# Patient Record
Sex: Male | Born: 1975 | Race: White | Hispanic: No | Marital: Married | Smoking: Smoker, current status unknown
Health system: Southern US, Community
[De-identification: ages and names within clinical notes are randomized; demographics above are authoritative.]

## PROBLEM LIST (undated history)

## (undated) DIAGNOSIS — F329 Major depressive disorder, single episode, unspecified: Secondary | ICD-10-CM

## (undated) DIAGNOSIS — I10 Essential (primary) hypertension: Secondary | ICD-10-CM

## (undated) DIAGNOSIS — G5600 Carpal tunnel syndrome, unspecified upper limb: Secondary | ICD-10-CM

## (undated) DIAGNOSIS — D696 Thrombocytopenia, unspecified: Secondary | ICD-10-CM

## (undated) DIAGNOSIS — E785 Hyperlipidemia, unspecified: Secondary | ICD-10-CM

## (undated) DIAGNOSIS — E039 Hypothyroidism, unspecified: Secondary | ICD-10-CM

## (undated) HISTORY — DX: Major depressive disorder, single episode, unspecified: F32.9

## (undated) HISTORY — DX: Hypothyroidism, unspecified: E03.9

## (undated) HISTORY — DX: Essential (primary) hypertension: I10

## (undated) HISTORY — PX: BONE MARROW BIOPSY: SHX1253

## (undated) HISTORY — DX: Carpal tunnel syndrome, unspecified upper limb: G56.00

## (undated) HISTORY — DX: Hyperlipidemia, unspecified: E78.5

## (undated) HISTORY — DX: Thrombocytopenia, unspecified: D69.6

---

## 2010-01-04 ENCOUNTER — Ambulatory Visit: Payer: Self-pay | Admitting: Family Medicine

## 2010-01-04 ENCOUNTER — Telehealth: Payer: Self-pay | Admitting: Pulmonary Disease

## 2010-01-04 DIAGNOSIS — R0989 Other specified symptoms and signs involving the circulatory and respiratory systems: Secondary | ICD-10-CM

## 2010-01-04 DIAGNOSIS — G5600 Carpal tunnel syndrome, unspecified upper limb: Secondary | ICD-10-CM

## 2010-01-04 DIAGNOSIS — R0609 Other forms of dyspnea: Secondary | ICD-10-CM

## 2010-01-04 DIAGNOSIS — R05 Cough: Secondary | ICD-10-CM

## 2010-01-04 DIAGNOSIS — F17200 Nicotine dependence, unspecified, uncomplicated: Secondary | ICD-10-CM

## 2010-01-04 HISTORY — DX: Carpal tunnel syndrome, unspecified upper limb: G56.00

## 2010-01-11 ENCOUNTER — Telehealth (INDEPENDENT_AMBULATORY_CARE_PROVIDER_SITE_OTHER): Payer: Self-pay | Admitting: *Deleted

## 2010-01-11 ENCOUNTER — Ambulatory Visit: Payer: Self-pay | Admitting: Family Medicine

## 2010-01-11 LAB — CONVERTED CEMR LAB
ALT: 29 units/L (ref 0–53)
Albumin: 4.5 g/dL (ref 3.5–5.2)
Basophils Relative: 1.2 % (ref 0.0–3.0)
CO2: 31 meq/L (ref 19–32)
Chloride: 106 meq/L (ref 96–112)
Eosinophils Absolute: 1 10*3/uL — ABNORMAL HIGH (ref 0.0–0.7)
HCT: 40.5 % (ref 39.0–52.0)
Hemoglobin: 14.4 g/dL (ref 13.0–17.0)
MCHC: 35.6 g/dL (ref 30.0–36.0)
MCV: 89 fL (ref 78.0–100.0)
Monocytes Absolute: 0.5 10*3/uL (ref 0.1–1.0)
Neutro Abs: 5.2 10*3/uL (ref 1.4–7.7)
Potassium: 4.5 meq/L (ref 3.5–5.1)
RBC: 4.55 M/uL (ref 4.22–5.81)
Sodium: 143 meq/L (ref 135–145)
Total Protein: 8.1 g/dL (ref 6.0–8.3)

## 2010-01-12 ENCOUNTER — Ambulatory Visit: Payer: Self-pay | Admitting: Family Medicine

## 2010-01-12 DIAGNOSIS — D696 Thrombocytopenia, unspecified: Secondary | ICD-10-CM

## 2010-01-12 HISTORY — DX: Thrombocytopenia, unspecified: D69.6

## 2010-01-12 LAB — CONVERTED CEMR LAB
Basophils Relative: 0.7 % (ref 0.0–3.0)
Eosinophils Absolute: 1.2 10*3/uL — ABNORMAL HIGH (ref 0.0–0.7)
Eosinophils Relative: 13.5 % — ABNORMAL HIGH (ref 0.0–5.0)
Lymphocytes Relative: 23.8 % (ref 12.0–46.0)
MCHC: 34.9 g/dL (ref 30.0–36.0)
Neutrophils Relative %: 57.6 % (ref 43.0–77.0)
Platelets: 62 10*3/uL — ABNORMAL LOW (ref 150.0–400.0)
RBC: 4.23 M/uL (ref 4.22–5.81)
WBC: 8.7 10*3/uL (ref 4.5–10.5)

## 2010-01-13 ENCOUNTER — Ambulatory Visit: Payer: Self-pay | Admitting: Hematology & Oncology

## 2010-01-19 ENCOUNTER — Ambulatory Visit: Payer: Self-pay | Admitting: Family Medicine

## 2010-01-19 DIAGNOSIS — J019 Acute sinusitis, unspecified: Secondary | ICD-10-CM

## 2010-01-19 DIAGNOSIS — H6690 Otitis media, unspecified, unspecified ear: Secondary | ICD-10-CM | POA: Insufficient documentation

## 2010-01-21 ENCOUNTER — Telehealth (INDEPENDENT_AMBULATORY_CARE_PROVIDER_SITE_OTHER): Payer: Self-pay | Admitting: *Deleted

## 2010-01-25 ENCOUNTER — Encounter: Payer: Self-pay | Admitting: Family Medicine

## 2010-01-25 LAB — CBC WITH DIFFERENTIAL (CANCER CENTER ONLY)
Eosinophils Absolute: 0.5 10*3/uL (ref 0.0–0.5)
LYMPH%: 21.9 % (ref 14.0–48.0)
MCV: 90 fL (ref 82–98)
MONO#: 0.4 10*3/uL (ref 0.1–0.9)
NEUT#: 5.5 10*3/uL (ref 1.5–6.5)
Platelets: 66 10*3/uL — ABNORMAL LOW (ref 145–400)
RBC: 4.55 10*6/uL (ref 4.20–5.70)
WBC: 8.2 10*3/uL (ref 4.0–10.0)

## 2010-01-25 LAB — CHCC SATELLITE - SMEAR

## 2010-01-26 ENCOUNTER — Ambulatory Visit: Payer: Self-pay | Admitting: Family Medicine

## 2010-01-26 DIAGNOSIS — E785 Hyperlipidemia, unspecified: Secondary | ICD-10-CM | POA: Insufficient documentation

## 2010-01-26 HISTORY — DX: Hyperlipidemia, unspecified: E78.5

## 2010-01-26 LAB — FERRITIN: Ferritin: 135 ng/mL (ref 22–322)

## 2010-01-26 LAB — COMPREHENSIVE METABOLIC PANEL
ALT: 22 U/L (ref 0–53)
CO2: 26 mEq/L (ref 19–32)
Calcium: 9.6 mg/dL (ref 8.4–10.5)
Chloride: 101 mEq/L (ref 96–112)
Sodium: 138 mEq/L (ref 135–145)
Total Protein: 7.8 g/dL (ref 6.0–8.3)

## 2010-03-15 ENCOUNTER — Ambulatory Visit: Payer: Self-pay | Admitting: Family Medicine

## 2010-03-15 DIAGNOSIS — E039 Hypothyroidism, unspecified: Secondary | ICD-10-CM | POA: Insufficient documentation

## 2010-03-15 HISTORY — DX: Hypothyroidism, unspecified: E03.9

## 2010-03-15 LAB — CONVERTED CEMR LAB
Free T4: 0.46 ng/dL — ABNORMAL LOW (ref 0.60–1.60)
T3, Free: 2.1 pg/mL — ABNORMAL LOW (ref 2.3–4.2)
TSH: 108.94 microintl units/mL — ABNORMAL HIGH (ref 0.35–5.50)

## 2010-04-02 ENCOUNTER — Ambulatory Visit: Payer: Self-pay | Admitting: Hematology & Oncology

## 2010-04-05 ENCOUNTER — Encounter: Payer: Self-pay | Admitting: Family Medicine

## 2010-04-05 LAB — CBC WITH DIFFERENTIAL (CANCER CENTER ONLY)
BASO#: 0 10*3/uL (ref 0.0–0.2)
EOS%: 4.9 % (ref 0.0–7.0)
HGB: 14.1 g/dL (ref 13.0–17.1)
LYMPH#: 2.1 10*3/uL (ref 0.9–3.3)
MCHC: 34.1 g/dL (ref 32.0–35.9)
MONO#: 0.3 10*3/uL (ref 0.1–0.9)
NEUT#: 5.2 10*3/uL (ref 1.5–6.5)
RBC: 4.68 10*6/uL (ref 4.20–5.70)
WBC: 8.1 10*3/uL (ref 4.0–10.0)

## 2010-04-12 ENCOUNTER — Ambulatory Visit: Payer: Self-pay | Admitting: Endocrinology

## 2010-04-20 ENCOUNTER — Ambulatory Visit: Payer: Self-pay | Admitting: Family Medicine

## 2010-04-21 LAB — CONVERTED CEMR LAB
ALT: 18 units/L (ref 0–53)
Albumin: 4.2 g/dL (ref 3.5–5.2)
Bilirubin, Direct: 0.1 mg/dL (ref 0.0–0.3)
Total Protein: 6.9 g/dL (ref 6.0–8.3)

## 2010-04-27 ENCOUNTER — Ambulatory Visit: Payer: Self-pay | Admitting: Family Medicine

## 2010-04-27 DIAGNOSIS — F329 Major depressive disorder, single episode, unspecified: Secondary | ICD-10-CM

## 2010-04-27 DIAGNOSIS — F3289 Other specified depressive episodes: Secondary | ICD-10-CM

## 2010-04-27 HISTORY — DX: Other specified depressive episodes: F32.89

## 2010-04-27 HISTORY — DX: Major depressive disorder, single episode, unspecified: F32.9

## 2010-05-07 ENCOUNTER — Ambulatory Visit: Payer: Self-pay | Admitting: Hematology & Oncology

## 2010-05-07 ENCOUNTER — Telehealth (INDEPENDENT_AMBULATORY_CARE_PROVIDER_SITE_OTHER): Payer: Self-pay | Admitting: *Deleted

## 2010-05-10 ENCOUNTER — Encounter: Payer: Self-pay | Admitting: Family Medicine

## 2010-05-10 ENCOUNTER — Encounter: Payer: Self-pay | Admitting: Endocrinology

## 2010-05-10 LAB — CBC WITH DIFFERENTIAL (CANCER CENTER ONLY)
BASO%: 0.6 % (ref 0.0–2.0)
LYMPH#: 1.6 10*3/uL (ref 0.9–3.3)
LYMPH%: 23.8 % (ref 14.0–48.0)
MONO#: 0.4 10*3/uL (ref 0.1–0.9)
NEUT#: 4.5 10*3/uL (ref 1.5–6.5)
Platelets: 47 10*3/uL — ABNORMAL LOW (ref 145–400)
RBC: 5.14 10*6/uL (ref 4.20–5.70)
RDW: 11.1 % (ref 10.5–14.6)
WBC: 6.9 10*3/uL (ref 4.0–10.0)

## 2010-05-10 LAB — TSH: TSH: 8.846 u[IU]/mL — ABNORMAL HIGH (ref 0.350–4.500)

## 2010-05-16 ENCOUNTER — Encounter: Payer: Self-pay | Admitting: Endocrinology

## 2010-06-25 ENCOUNTER — Ambulatory Visit: Payer: Self-pay | Admitting: Hematology & Oncology

## 2010-06-28 ENCOUNTER — Encounter: Payer: Self-pay | Admitting: Family Medicine

## 2010-06-28 LAB — CHCC SATELLITE - SMEAR

## 2010-06-28 LAB — CBC WITH DIFFERENTIAL (CANCER CENTER ONLY)
BASO#: 0.1 10*3/uL (ref 0.0–0.2)
EOS%: 3 % (ref 0.0–7.0)
MCH: 28.1 pg (ref 28.0–33.4)
MCHC: 33.6 g/dL (ref 32.0–35.9)
MONO%: 6.4 % (ref 0.0–13.0)
NEUT#: 4.7 10*3/uL (ref 1.5–6.5)
Platelets: 40 10*3/uL — ABNORMAL LOW (ref 145–400)
RDW: 12.5 % (ref 10.5–14.6)

## 2010-07-27 ENCOUNTER — Ambulatory Visit (HOSPITAL_COMMUNITY): Admission: RE | Admit: 2010-07-27 | Discharge: 2010-07-27 | Payer: Self-pay | Admitting: Hematology & Oncology

## 2010-07-27 ENCOUNTER — Ambulatory Visit: Payer: Self-pay | Admitting: Hematology & Oncology

## 2010-08-06 ENCOUNTER — Ambulatory Visit: Payer: Self-pay | Admitting: Hematology & Oncology

## 2010-08-09 ENCOUNTER — Encounter: Payer: Self-pay | Admitting: Family Medicine

## 2010-08-09 LAB — CBC WITH DIFFERENTIAL (CANCER CENTER ONLY)
BASO%: 0.4 % (ref 0.0–2.0)
EOS%: 3.8 % (ref 0.0–7.0)
HCT: 44.7 % (ref 38.7–49.9)
LYMPH%: 19.2 % (ref 14.0–48.0)
MCHC: 34 g/dL (ref 32.0–35.9)
MCV: 84 fL (ref 82–98)
MONO#: 0.3 10*3/uL (ref 0.1–0.9)
NEUT%: 72.1 % (ref 40.0–80.0)
RDW: 13.2 % (ref 10.5–14.6)

## 2010-10-18 ENCOUNTER — Encounter: Payer: Self-pay | Admitting: Family Medicine

## 2010-10-18 ENCOUNTER — Ambulatory Visit
Admission: RE | Admit: 2010-10-18 | Discharge: 2010-10-18 | Payer: Self-pay | Source: Home / Self Care | Attending: Family Medicine | Admitting: Family Medicine

## 2010-10-21 ENCOUNTER — Ambulatory Visit
Admission: RE | Admit: 2010-10-21 | Discharge: 2010-10-21 | Payer: Self-pay | Source: Home / Self Care | Attending: Family Medicine | Admitting: Family Medicine

## 2010-10-21 ENCOUNTER — Other Ambulatory Visit: Payer: Self-pay | Admitting: Family Medicine

## 2010-10-21 LAB — CONVERTED CEMR LAB
Bilirubin Urine: NEGATIVE
Glucose, Urine, Semiquant: NEGATIVE
Ketones, urine, test strip: NEGATIVE
Specific Gravity, Urine: 1.02
pH: 5

## 2010-10-21 LAB — CBC WITH DIFFERENTIAL/PLATELET
Eosinophils Relative: 2.8 % (ref 0.0–5.0)
HCT: 44.9 % (ref 39.0–52.0)
Hemoglobin: 15.4 g/dL (ref 13.0–17.0)
Lymphs Abs: 1.6 10*3/uL (ref 0.7–4.0)
Monocytes Relative: 6.8 % (ref 3.0–12.0)
Platelets: 45 10*3/uL — CL (ref 150.0–400.0)
RBC: 5.3 Mil/uL (ref 4.22–5.81)
WBC: 7.5 10*3/uL (ref 4.5–10.5)

## 2010-10-21 LAB — LIPID PANEL
Cholesterol: 178 mg/dL (ref 0–200)
LDL Cholesterol: 107 mg/dL — ABNORMAL HIGH (ref 0–99)
Total CHOL/HDL Ratio: 6

## 2010-10-21 LAB — BASIC METABOLIC PANEL
GFR: 96.34 mL/min (ref >60.00)
Glucose, Bld: 92 mg/dL (ref 70–99)
Potassium: 4.3 mEq/L (ref 3.5–5.1)
Sodium: 138 mEq/L (ref 135–145)

## 2010-10-21 LAB — HEPATIC FUNCTION PANEL
ALT: 24 U/L (ref 0–53)
AST: 23 U/L (ref 0–37)
Total Bilirubin: 0.6 mg/dL (ref 0.3–1.2)

## 2010-10-26 NOTE — Letter (Signed)
[facility]   Imported By: Lanelle Bal 05/25/2010 11:04:18  _____________________________________________________________________  External Attachment:    Type:   Image     Comment:   External Document

## 2010-10-26 NOTE — Assessment & Plan Note (Signed)
Summary: discuss labs/cholestorol med refill/kn   Vital Signs:  Patient profile:   35 year old male Height:      71 inches (180.34 cm) Weight:      269.25 pounds (122.39 kg) BMI:     37.69 Temp:     98.0 degrees F (36.67 degrees C) oral BP sitting:   100 / 64  (right arm) Cuff size:   large  Vitals Entered By: Lucious Groves CMA (April 27, 2010 11:04 AM) CC: Discuss labs/,ed refill./kb Is Patient Diabetic? No Pain Assessment Patient in pain? no        History of Present Illness:  Hyperlipidemia follow-up      This is a 35 year old man who presents for Hyperlipidemia follow-up.  The patient denies muscle aches, GI upset, abdominal pain, flushing, itching, constipation, diarrhea, and fatigue.  The patient denies the following symptoms: chest pain/pressure, exercise intolerance, dypsnea, palpitations, syncope, and pedal edema.  Compliance with medications (by patient report) has been near 100%.  Dietary compliance has been good.  The patient reports exercising daily.  Adjunctive measures currently used by the patient include weight reduction and Co-QA.    pt is also interested in quitting smoking-- chantix was too expensive.  He has not tried anything else.  Pt is depressed over the fact that he can not quit on his own.  His wife smokes as well.     Preventive Screening-Counseling & Management  Alcohol-Tobacco     Alcohol drinks/day: 0     Smoking Status: current     Smoking Cessation Counseling: yes     Smoke Cessation Stage: ready     Packs/Day: 2.0     Year Started: 1995  Caffeine-Diet-Exercise     Caffeine use/day: 2     Does Patient Exercise: no     Exercise Counseling: to improve exercise regimen  Current Medications (verified): 1)  Simcor 1000-20 Mg Xr24h-Tab (Niacin-Simvastatin) .Marland Kitchen.. 1 By Mouth Q Hs 2)  Levothyroxine Sodium 125 Mcg Tabs (Levothyroxine Sodium) .Marland Kitchen.. 1 Once Daily  Allergies (verified): No Known Drug Allergies  Physical Exam  General:   Well-developed,well-nourished,in no acute distress; alert,appropriate and cooperative throughout examination Lungs:  Normal respiratory effort, chest expands symmetrically. Lungs are clear to auscultation, no crackles or wheezes. Heart:  normal rate and no murmur.   Extremities:  No clubbing, cyanosis, edema, or deformity noted with normal full range of motion of all joints.   Psych:  Cognition and judgment appear intact. Alert and cooperative with normal attention span and concentration. No apparent delusions, illusions, hallucinations   Impression & Recommendations:  Problem # 1:  HYPERLIPIDEMIA (ICD-272.4)  pt is taking 500/20 samples-- for last 3-4 days --Dr Everardo All gave him samples NMR still pending  His updated medication list for this problem includes:    Simcor 1000-20 Mg Xr24h-tab (Niacin-simvastatin) .Marland Kitchen... 1 by mouth q hs  Labs Reviewed: SGOT: 22 (04/20/2010)   SGPT: 18 (04/20/2010)  Problem # 2:  TOBACCO USE (ICD-305.1)  Encouraged smoking cessation and discussed different methods for smoking cessation.   Orders: Tobacco use cessation intermediate 3-10 minutes (16109)  Problem # 3:  DEPRESSIVE DISORDER (ICD-311)  His updated medication list for this problem includes:    Wellbutrin Xl 150 Mg Xr24h-tab (Bupropion hcl) .Marland Kitchen... 1 by mouth once daily for 1 week then 2 by mouth once daily for 3 weeks    Wellbutrin Xl 150 Mg Xr24h-tab (Bupropion hcl) .Marland KitchenMarland KitchenMarland KitchenMarland Kitchen 3 by mouth once daily  Discussed treatment options, including trial  of antidpressant medication. Will refer to behavioral health. Follow-up call in in 24-48 hours and recheck in 2 weeks, sooner as needed. Patient agrees to call if any worsening of symptoms or thoughts of doing harm arise. Verified that the patient has no suicidal ideation at this time.   Complete Medication List: 1)  Simcor 1000-20 Mg Xr24h-tab (Niacin-simvastatin) .Marland Kitchen.. 1 by mouth q hs 2)  Levothyroxine Sodium 125 Mcg Tabs (Levothyroxine sodium) .Marland Kitchen.. 1 once  daily 3)  Wellbutrin Xl 150 Mg Xr24h-tab (Bupropion hcl) .Marland Kitchen.. 1 by mouth once daily for 1 week then 2 by mouth once daily for 3 weeks 4)  Wellbutrin Xl 150 Mg Xr24h-tab (Bupropion hcl) .... 3 by mouth once daily Prescriptions: WELLBUTRIN XL 150 MG XR24H-TAB (BUPROPION HCL) 3 by mouth once daily  #90 x 5   Entered and Authorized by:   Loreen Freud DO   Signed by:   Loreen Freud DO on 04/27/2010   Method used:   Print then Give to Patient   RxID:   [id number] WELLBUTRIN XL 150 MG XR24H-TAB (BUPROPION HCL) 3 by mouth once daily  #90 x 5   Entered and Authorized by:   Loreen Freud DO   Signed by:   Loreen Freud DO on 04/27/2010   Method used:   Print then Give to Patient   RxID:   [id number] WELLBUTRIN XL 150 MG XR24H-TAB (BUPROPION HCL) 1 by mouth once daily for 1 week then 2 by mouth once daily for 3 weeks  #60 x 0   Entered and Authorized by:   Loreen Freud DO   Signed by:   Loreen Freud DO on 04/27/2010   Method used:   [facility]        [facility] [phone][facility] (retail)       [address]       [place], [place]  [address]       Ph: [phone]       Fax: [phone]   RxID:   [id number]

## 2010-10-26 NOTE — Letter (Signed)
Summary: [facility]  [facility]   Imported By: Lanelle Bal 08/24/2010 12:34:14  _____________________________________________________________________  External Attachment:    Type:   Image     Comment:   External Document

## 2010-10-26 NOTE — Progress Notes (Signed)
Summary: -lab results  Phone Note Outgoing Call   Call placed by: [facility] CMA,  May 07, 2010 12:05 PM Details for Reason: con't simcor20/500----HDL is low--- more niaspan could help--- but you can try fish oil or flaxseed oil---recheck 3 months------NMR, hep 272.4 Summary of Call: left message to call office................Marland KitchenFelecia Deloach CMA  May 07, 2010 12:05 PM   Follow-up for Phone Call        pt aware. pt states that he has sample of med does not need refill at this time.............Marland KitchenFelecia Deloach CMA  May 10, 2010 11:05 AM   letter mailed .Marland KitchenMarland KitchenMarland KitchenFelecia Deloach CMA  May 10, 2010 11:08 AM     New/Updated Medications: SIMCOR 500-20 MG XR24H-TAB (NIACIN-SIMVASTATIN) take 1 tab at bedtime

## 2010-10-26 NOTE — Consult Note (Signed)
[facility]   Imported By: Lanelle Bal 02/02/2010 09:23:39  _____________________________________________________________________  External Attachment:    Type:   Image     Comment:   External Document

## 2010-10-26 NOTE — Assessment & Plan Note (Signed)
Summary: NEW PT/BCBS/NS/KDC   Vital Signs:  Patient profile:   35 year old male Height:      71 inches Weight:      304 pounds BMI:     42.55 Pulse rate:   84 / minute Pulse rhythm:   regular BP sitting:   130 / 84  (left arm) Cuff size:   large  Vitals Entered By: Army Fossa CMA (January 04, 2010 10:14 AM) CC: Pt here to establish, c/o being tired all the time.   History of Present Illness: Pt here to establish.  Pt c/o " circulation " in hands ---  Pt wakes up with numbness in hands.  He sleeps on stomach.  Pt works at [employer] and has a problem when [employer].  Pt has also been very tired lately.  No other complaints.     Preventive Screening-Counseling & Management  Alcohol-Tobacco     Alcohol drinks/day: 0     Smoking Status: current     Smoking Cessation Counseling: yes     Smoke Cessation Stage: ready     Packs/Day: 2.0     Year Started: 1995  Caffeine-Diet-Exercise     Caffeine use/day: 2     Does Patient Exercise: no     Exercise Counseling: to improve exercise regimen  Hep-HIV-STD-Contraception     Dental Visit-last 6 months no      Sexual History:  currently monogamous and married.        Drug Use:  no.    Current Medications (verified): 1)  Chantix Starting Month Pak 0.5 Mg X 11 & 1 Mg X 42 Tabs (Varenicline Tartrate) .... 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 Heart Disease Family History Diabetes 1st degree relative Family History Hypertension Family History of CAD Male 1st degree relative @ 33yo Family History Thyroid disease  Social History: Reviewed history and no changes required. 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 Eyes:  Denies blurring, discharge, double vision, eye irritation, eye pain, halos, itching, light sensitivity, red eye, vision loss-1 eye, and vision loss-both eyes; glasses. ENT:  Denies decreased hearing, difficulty swallowing, ear discharge, earache, hoarseness, nasal congestion, nosebleeds, postnasal drainage, ringing in ears, sinus pressure, and sore throat. CV:  Denies bluish discoloration of lips or nails, chest pain or discomfort, difficulty breathing at night, difficulty breathing while lying down, fainting, fatigue, leg cramps with exertion, lightheadness, near fainting, palpitations, shortness of breath with exertion, swelling of feet, swelling of hands, and weight gain. Resp:  Complains of wheezing; denies  chest discomfort, chest pain with inspiration, cough, coughing up blood, excessive snoring, hypersomnolence, morning headaches, pleuritic, shortness of breath, and sputum productive. GI:  Denies abdominal pain, bloody stools, change in bowel habits, constipation, dark tarry stools, diarrhea, excessive appetite, gas, hemorrhoids, indigestion, loss of appetite, nausea, vomiting, vomiting blood, and  yellowish skin color. GU:  Denies decreased libido, discharge, dysuria, erectile dysfunction, genital sores, hematuria, incontinence, nocturia, urinary frequency, and urinary hesitancy. MS:  Denies joint pain, joint redness, joint swelling, loss of strength, low back pain, mid back pain, muscle aches, muscle , cramps, muscle weakness, stiffness, and thoracic pain. Derm:  Denies changes in color of skin, changes in nail beds, dryness, excessive perspiration, flushing, hair loss, insect bite(s), itching, lesion(s), poor wound healing, and rash. Neuro:  Complains of numbness; denies brief paralysis, difficulty with concentration, disturbances in coordination, falling down, headaches, inability to speak, memory loss, poor balance, seizures, sensation of room spinning, tingling, tremors, visual disturbances, and weakness; numbness in hands. Psych:  Denies alternate hallucination ( auditory/visual), anxiety, depression, easily angered, easily tearful, irritability, mental problems, panic attacks, sense of great danger, suicidal thoughts/plans, thoughts of violence, unusual visions or sounds, and thoughts /plans of harming others. Endo:  Denies cold intolerance, excessive hunger, excessive thirst, excessive urination, heat intolerance, polyuria, and weight change. Heme:  Denies abnormal bruising, bleeding, enlarge lymph nodes, fevers, pallor, and skin discoloration. Allergy:  Denies hives or rash, itching eyes, persistent infections, seasonal allergies, and sneezing.  Physical Exam  General:  Well-developed,well-nourished,in no acute distress; alert,appropriate and cooperative throughout examination Head:  Normocephalic and atraumatic without obvious abnormalities. No apparent alopecia or balding. Eyes:  pupils equal, pupils round, pupils reactive to light, and no injection.   Ears:  External ear exam shows no significant lesions or deformities.  Otoscopic examination reveals clear canals, tympanic membranes  are intact bilaterally without bulging, retraction, inflammation or discharge. Hearing is grossly normal bilaterally. Nose:  External nasal examination shows no deformity or inflammation. Nasal mucosa are pink and moist without lesions or exudates. Mouth:  Oral mucosa and oropharynx without lesions or exudates.  Teeth in good repair. Neck:  No deformities, masses, or tenderness noted. Lungs:  Normal respiratory effort, chest expands symmetrically. Lungs are clear to auscultation, no crackles or wheezes. Heart:  normal rate and no murmur.   Abdomen:  Bowel sounds positive,abdomen soft and non-tender without masses, organomegaly or hernias noted. Genitalia:  Testes bilaterally descended without nodularity, tenderness or masses. No scrotal masses or lesions. No penis lesions or urethral discharge. Prostate:  Prostate gland firm and smooth, no enlargement, nodularity, tenderness, mass, asymmetry or induration. Msk:  normal ROM, no joint tenderness, no joint swelling, no joint warmth, no redness over joints, no joint deformities, no joint instability, and no crepitation.   Pulses:  R and L carotid,radial,femoral,dorsalis pedis and posterior tibial pulses are full and equal bilaterally Extremities:  No clubbing, cyanosis, edema, or deformity noted with normal full range of motion of all joints.   Neurologic:  No cranial nerve deficits noted. Station and gait are normal. Plantar reflexes are down-going bilaterally. DTRs are symmetrical throughout. Sensory, motor and coordinative functions appear intact. Skin:  Intact without suspicious lesions or rashes Cervical Nodes:  No lymphadenopathy noted Axillary Nodes:  No palpable lymphadenopathy Inguinal Nodes:  No significant adenopathy Psych:  Cognition and judgment appear intact. Alert and cooperative with normal attention span and concentration. No apparent delusions, illusions, hallucinations   Impression & Recommendations:  Problem # 1:  PREVENTIVE  HEALTH CARE (ICD-V70.0)  check fastng labs Reviewed preventive care protocols, scheduled due services, and updated immunizations.  Problem # 2:  TOBACCO USE (ICD-305.1)  His updated medication list for this problem includes:    Chantix Starting Month Pak 0.5 Mg X 11 & 1 Mg X 42 Tabs (Varenicline tartrate) .Marland Kitchen... As directed  Orders: Pulmonary Referral (Pulmonary) Tobacco use cessation intermediate 3-10 minutes (16109)  Encouraged smoking cessation and discussed different methods for smoking cessation.   Problem # 3:  CARPAL TUNNEL SYNDROME, BILATERAL (ICD-354.0) wrist splints hand surgeon if no better  Problem # 4:  SNORING (ICD-786.09)  Orders: Sleep Disorder Referral (Sleep Disorder)  Recommended fluid and salt restriction.   Problem # 5:  COUGH (ICD-786.2)  Orders: T-2 View CXR (71020TC) Pulmonary Referral (Pulmonary)  Complete Medication List: 1)  Chantix Starting Month Pak 0.5 Mg X 11 & 1 Mg X 42 Tabs (Varenicline tartrate) .... As directed  Patient Instructions: 1)  V70.00  cbcd, bmp, hep, NMR, TSH,  UA Prescriptions: CHANTIX STARTING MONTH PAK 0.5 MG X 11 & 1 MG X 42 TABS (VARENICLINE TARTRATE) as directed  #1 x 0   Entered and Authorized by:   Loreen Freud DO   Signed by:   Loreen Freud DO on 01/04/2010   Method used:   Print then Give to Patient   RxID:   [id number]

## 2010-10-26 NOTE — Consult Note (Signed)
[facility]   Imported By: Lanelle Bal 02/15/2010 10:36:58  _____________________________________________________________________  External Attachment:    Type:   Image     Comment:   External Document

## 2010-10-26 NOTE — Progress Notes (Signed)
Summary: Lab Results  Phone Note Outgoing Call   Call placed by: Army Fossa CMA,  January 21, 2010 1:31 PM Reason for Call: Discuss lab or test results Summary of Call: Regarding lab results, LMTCB:  LDL particle numbers --high----high risk heart attack and stroke----- ov to discuss treatment and labs Signed by Loreen Freud DO on 01/20/2010 at 2:20 PM  Follow-up for Phone Call        Pt is coming 5.3.11. Army Fossa CMA  January 21, 2010 1:39 PM

## 2010-10-26 NOTE — Miscellaneous (Signed)
  Clinical Lists Changes  Medications: Removed medication of LEVOTHYROXINE SODIUM 125 MCG TABS (LEVOTHYROXINE SODIUM) 1 once daily Added new medication of LEVOTHYROXINE SODIUM 150 MCG TABS (LEVOTHYROXINE SODIUM) 1 tab once daily - Signed Rx of LEVOTHYROXINE SODIUM 150 MCG TABS (LEVOTHYROXINE SODIUM) 1 tab once daily;  #30 x 11;  Signed;  Entered by: Minus Breeding MD;  Authorized by: Minus Breeding MD;  Method used: [facility] [phone]*, [address], [place], [place], [place]  [address], Ph: [phone], Fax: [phone]    Prescriptions: LEVOTHYROXINE SODIUM 150 MCG TABS (LEVOTHYROXINE SODIUM) 1 tab once daily  #30 x 11   Entered and Authorized by:   Minus Breeding MD   Signed by:   Minus Breeding MD on 05/16/2010   Method used:   [facility] to        [facility] [phone]* (retail)       [address]       [place], [place]  [address]       Ph: [phone]       Fax: [phone]   RxID:   [id number]

## 2010-10-26 NOTE — Assessment & Plan Note (Signed)
Summary: NEW ENDO CON/ HYPOTHYROID/BCBS/NWS  #   Vital Signs:  Patient profile:   35 year old male Height:      71 inches (180.34 cm) Weight:      266.25 pounds (121.02 kg) BMI:     37.27 O2 Sat:      97 % on Room air Temp:     98.2 degrees F (36.78 degrees C) oral Pulse rate:   72 / minute BP sitting:   128 / 88  (left arm) Cuff size:   large  Vitals Entered By: Brenton Grills MA (April 12, 2010 11:01 AM)  O2 Flow:  Room air CC: New Endo/Hypothyroid/pt is no longer taking Chantix/aj   Primary Provider:  Laury Axon  CC:  New Endo/Hypothyroid/pt is no longer taking Chantix/aj.  History of Present Illness: pt was noted on routine bloos test 3 mos ago to have very high tsh.   he was rx'ed synthroid 50 micrograms/day.  a few weeks ago, it was increased to 100 micrograms/day.  he feels no different, and pretty well in general.   he has few years of mild dryness at the skin, and associated weight gain of 40 lbs.  since on synthroid, he has re-lost 30 of the lbs he had gained.   he sees dr Myna Hidalgo for thrombocytopenia, which pt says is suspected to be of thyroid etiology.    Current Medications (verified): 1)  Chantix Starting Month Pak 0.5 Mg X 11 & 1 Mg X 42 Tabs (Varenicline Tartrate) .... As Directed 2)  Synthroid 100 Mcg Tabs (Levothyroxine Sodium) .Marland Kitchen.. 1 By Mouth Daily. 3)  Claritin 10 Mg Tabs (Loratadine) .Marland Kitchen.. 1 By Mouth Once Daily As Needed Allergies 4)  Simcor 1000-20 Mg Xr24h-Tab (Niacin-Simvastatin) .Marland Kitchen.. 1 By Mouth Q Hs  Allergies (verified): No Known Drug Allergies  Past History:  Past Medical History: Last updated: 01/26/2010 Hyperlipidemia  Family History: Reviewed history from 01/04/2010 and no changes required. Heart Disease Family History Diabetes 1st degree relative Family History Hypertension Family History of CAD Male 1st degree relative @ 45yo Mother had hypothyroidism.  Social History: Reviewed history from 01/04/2010 and no changes  required. 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   he reports fatigue.  he reports easy bruising.    Physical Exam  General:  obese.  no distress  Head:  head: no deformity eyes: no periorbital swelling, no proptosis external nose and ears are normal mouth: no lesion seen Neck:  Supple without thyroid enlargement or tenderness.  Lungs:  Clear to auscultation bilaterally. Normal respiratory effort.  Heart:  Regular rate and rhythm without murmurs or gallops noted. Normal S1,S2.   Abdomen:  abdomen is soft, nontender.  no hepatosplenomegaly.   not distended.  no hernia  Msk:  muscle bulk and strength are grossly normal.  no obvious joint swelling.  gait is normal and steady  Extremities:  no edema no deformity Neurologic:  sensation is intact to touch on all 4's cn 2-12 grossly intact.   readily moves all 4's.    Skin:  normal texture and temp.  no rash.  not diaphoretic.  Cervical Nodes:  No significant adenopathy.  Psych:  Alert and cooperative; normal mood and affect; normal attention span and concentration.   Additional Exam:  today: FastTSH              [  H]  15.36 uIU/mL    Impression & Recommendations:  Problem # 1:  HYPOTHYROIDISM (ICD-244.9) severe, but much better  Problem # 2:  THROMBOCYTOPENIA (ICD-287.5) ? related to #1  Problem # 3:  HYPERLIPIDEMIA (ICD-272.4) this will improve with improvement in #1  Medications Added to Medication List This Visit: 1)  Levothyroxine Sodium 125 Mcg Tabs (Levothyroxine sodium) .Marland Kitchen.. 1 once daily  Other Orders: TLB-TSH (Thyroid Stimulating Hormone) (16109-UEA) Consultation Level IV (54098)  Patient Instructions: 1)  cc dr Myna Hidalgo.   2)  your low thyroid is almost always  permanent, so you should plan on continuing your levothyroxine indefinitely. 3)  blood tests are being ordered for you today.  please call [phone] to hear your test results.  i suspect we will need to increase the levothyroxine further. 4)  your cholesterol will improve with improvement in your thyroid. 5)  (update: i left message on phone-tree:  increase synthroid to 125/day.  go to lab in 1 month for tsh 244.9). Prescriptions: LEVOTHYROXINE SODIUM 125 MCG TABS (LEVOTHYROXINE SODIUM) 1 once daily  #30 x 2   Entered and Authorized by:   Minus Breeding MD   Signed by:   Minus Breeding MD on 04/12/2010   Method used:   [facility]ronically to        [facility] [phone]* (retail)       [address]       [place], [place]  [address]       Ph: [phone]       Fax: [phone]   RxID:   [id number]

## 2010-10-26 NOTE — Assessment & Plan Note (Signed)
Summary: roa/ lab?/cbs   Vital Signs:  Patient profile:   35 year old male Weight:      251 pounds Pulse rate:   88 / minute Pulse rhythm:   regular BP sitting:   130 / 86  (left arm) Cuff size:   large  Vitals Entered By: Army Fossa CMA (Jan 26, 2010 12:42 PM) CC: Daniel Acevedo here for ROA- labs.   History of Present Illness: Daniel Acevedo here to review labs and for ear check.  Daniel Acevedo states he is feelin much better but R ear still bothering him.  No other complaints.    Allergies (verified): No Known Drug Allergies  Past History:  Past Medical History: Hyperlipidemia  Physical Exam  General:  Well-developed,well-nourished,in no acute distress; alert,appropriate and cooperative throughout examination Ears:  R tm still slightly red L tm normal Neck:  No deformities, masses, or tenderness noted. Psych:  Oriented X3 and normally interactive.     Impression & Recommendations:  Problem # 1:  OTITIS MEDIA, ACUTE, BILATERAL (ICD-382.9) Assessment Improved  R TM still slightly red---finish abx take claritin and con't veramyst to Ent if no better in a few weeks or if symptoms worsen His updated medication list for this problem includes:    Augmentin 875-125 Mg Tabs (Amoxicillin-pot clavulanate) .Marland Kitchen... 1 by mouth two times a day  Instructed on prevention and treatment. Call if no improvement in 48-72 hours or sooner if worsening symptoms.   Problem # 2:  HYPERLIPIDEMIA (ICD-272.4) NMR reviewed with Daniel Acevedo---samples given with instructions on how to take and SE--Daniel Acevedo understands His updated medication list for this problem includes:    Simcor 500-20 Mg Xr24h-tab (Niacin-simvastatin) .Marland Kitchen... 1 by mouth at bedtime x 1 month    Simcor 1000-20 Mg Xr24h-tab (Niacin-simvastatin) .Marland Kitchen... 1 by mouth q hs  Labs Reviewed: SGOT: 42 (01/11/2010)   SGPT: 29 (01/11/2010)  Complete Medication List: 1)  Chantix Starting Month Pak 0.5 Mg X 11 & 1 Mg X 42 Tabs (Varenicline tartrate) .... As directed 2)  Synthroid  50 Mcg Tabs (Levothyroxine sodium) .Marland Kitchen.. 1 by mouth once daily. 3)  Augmentin 875-125 Mg Tabs (Amoxicillin-pot clavulanate) .Marland Kitchen.. 1 by mouth two times a day 4)  Claritin 10 Mg Tabs (Loratadine) .Marland Kitchen.. 1 by mouth once daily as needed allergies 5)  Simcor 500-20 Mg Xr24h-tab (Niacin-simvastatin) .Marland Kitchen.. 1 by mouth at bedtime x 1 month 6)  Simcor 1000-20 Mg Xr24h-tab (Niacin-simvastatin) .Marland Kitchen.. 1 by mouth q hs  Patient Instructions: 1)  TSH in June 244.9 2)  fasting labs end july---272.4  hep, nmr Prescriptions: SIMCOR 1000-20 MG XR24H-TAB (NIACIN-SIMVASTATIN) 1 by mouth q hs  #30 x 1   Entered and Authorized by:   Loreen Freud DO   Signed by:   Loreen Freud DO on 01/26/2010   Method used:   Print then Give to Patient   RxID:   [id number] CLARITIN 10 MG TABS (LORATADINE) 1 by mouth once daily as needed allergies  #30 x 11   Entered and Authorized by:   Loreen Freud DO   Signed by:   Loreen Freud DO on 01/26/2010   Method used:   [facility] to        [facility] [phone]* (retail)       [address]       [place], [place]  [address]       Ph: [phone]       Fax: [phone]   RxID:  [id number]  

## 2010-10-26 NOTE — Letter (Signed)
Summary: [facility]  [facility]   Imported By: Lanelle Bal 06/09/2010 12:23:25  _____________________________________________________________________  External Attachment:    Type:   Image     Comment:   External Document

## 2010-10-26 NOTE — Progress Notes (Signed)
Summary: Lab Results   Phone Note Outgoing Call   Call placed by: Army Fossa CMA,  January 11, 2010 4:17 PM Reason for Call: Discuss lab or test results Summary of Call: Regarding lab results, LMTCB:  Hypothyroid------  synthroid 50 micrograms #30 1 by mouth once daily 2 refills---recheck 2 months  TSH  ,  free T3,  free T4  244.9  repeat CBCD tomorrow 288.8---platlets low---will need referral if correct,   Signed by Loreen Freud DO on 01/11/2010 at 4:14 PM  Follow-up for Phone Call        Pt is aware. Army Fossa CMA  January 12, 2010 8:52 AM     New/Updated Medications: SYNTHROID 50 MCG TABS (LEVOTHYROXINE SODIUM) 1 by mouth once daily. Prescriptions: SYNTHROID 50 MCG TABS (LEVOTHYROXINE SODIUM) 1 by mouth once daily.  #30 x 2   Entered by:   Army Fossa CMA   Authorized by:   Loreen Freud DO   Signed by:   Army Fossa CMA on 01/12/2010   Method used:   [facility] to        [facility] [phone]* (retail)       [address]       [place], [place]  [address]       Ph: [phone]       Fax: [phone]   RxID:   [id number]

## 2010-10-26 NOTE — Assessment & Plan Note (Signed)
Summary: HEARING LOSS 1 EAR & DRAINAGE/RH......Marland Kitchen   Vital Signs:  Patient profile:   35 year old male Weight:      303 pounds Temp:     97.9 degrees F oral Pulse rate:   88 / minute Pulse rhythm:   regular BP sitting:   128 / 86  (left arm) Cuff size:   large  Vitals Entered By: Army Fossa CMA (January 19, 2010 9:49 AM) CC: Pt here c/o head congestion, chest congestion, feels like it has moved into his ears now. Ears do not hurt feels like he cannot hear. , URI symptoms   History of Present Illness:       This is a 35 year old man who presents with URI symptoms.  The symptoms began 5 days ago.  Pt c/o b/l ear pressure.  The patient complains of nasal congestion, purulent nasal discharge, productive cough, earache, and sick contacts.  The patient denies fever, low-grade fever (<100.5 degrees), fever of 100.5-103 degrees, fever of 103.1-104 degrees, fever to >104 degrees, stiff neck, dyspnea, wheezing, rash, vomiting, diarrhea, use of an antipyretic, and response to antipyretic.  The patient also reports headache.  The patient denies itchy watery eyes, itchy throat, sneezing, seasonal symptoms, response to antihistamine, muscle aches, and severe fatigue.  The patient denies the following risk factors for Strep sinusitis: unilateral facial pain, unilateral nasal discharge, poor response to decongestant, double sickening, tooth pain, Strep exposure, tender adenopathy, and absence of cough.    Current Medications (verified): 1)  Chantix Starting Month Pak 0.5 Mg X 11 & 1 Mg X 42 Tabs (Varenicline Tartrate) .... As Directed 2)  Synthroid 50 Mcg Tabs (Levothyroxine Sodium) .Marland Kitchen.. 1 By Mouth Once Daily. 3)  Augmentin 875-125 Mg Tabs (Amoxicillin-Pot Clavulanate) .Marland Kitchen.. 1 By Mouth Two Times A Day  Allergies (verified): No Known Drug Allergies  Past History:  Past medical, surgical, family and social histories (including risk factors) reviewed for relevance to current acute and chronic  problems.  Family History: Reviewed history from 01/04/2010 and no changes required. Heart Disease Family History Diabetes 1st degree relative Family History Hypertension Family History of CAD Male 1st degree relative @ 33yo Family History Thyroid disease  Social History: Reviewed history from 01/04/2010 and no changes required. 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   Nose:  L frontal sinus tenderness, L maxillary sinus tenderness, R frontal sinus tenderness, and R maxillary sinus tenderness.   Mouth:  Oral mucosa and oropharynx without lesions or exudates.  Teeth in good repair. Neck:  No deformities, masses, or tenderness noted. Lungs:  Normal respiratory effort, chest expands symmetrically. Lungs are clear to auscultation, no crackles or wheezes. Heart:  Normal rate and regular rhythm. S1 and S2 normal without gallop, murmur, click, rub or other extra sounds. Psych:  Oriented X3 and normally interactive.     Impression & Recommendations:  Problem # 1:  SINUSITIS - ACUTE-NOS (ICD-461.9)  The following medications were removed from the medication list:    Cipro 500 Mg Tabs (Ciprofloxacin hcl) .Marland Kitchen... 1 by mouth two times a day for 5 days. His updated medication list for this problem includes:    Augmentin 875-125 Mg Tabs (Amoxicillin-pot clavulanate) .Marland Kitchen... 1 by mouth two times a day  Instructed on treatment. Call if symptoms persist or worsen.   Problem # 2:  OTITIS MEDIA, ACUTE, BILATERAL (ICD-382.9)  The following medications were removed from the medication list:    Cipro 500 Mg Tabs (Ciprofloxacin hcl) .Marland Kitchen... 1 by mouth two times a day for 5 days. His updated medication  list for this problem includes:    Augmentin 875-125 Mg Tabs (Amoxicillin-pot clavulanate) .Marland Kitchen... 1 by mouth two times a day  Instructed on prevention and treatment. Call if no improvement in 48-72 hours or sooner if worsening symptoms.   Complete Medication List: 1)  Chantix Starting Month Pak 0.5 Mg X 11 & 1 Mg X 42 Tabs (Varenicline tartrate) .... As directed 2)  Synthroid 50 Mcg Tabs (Levothyroxine sodium) .Marland Kitchen.. 1 by mouth once daily. 3)  Augmentin 875-125 Mg Tabs (Amoxicillin-pot clavulanate) .Marland Kitchen.. 1 by mouth two times a day Prescriptions: AUGMENTIN 875-125 MG TABS (AMOXICILLIN-POT CLAVULANATE) 1 by mouth two times a day  #20 x 0   Entered and Authorized by:   Loreen Freud DO   Signed by:   Loreen Freud DO on 01/19/2010   Method used:   [facility]        [facility] [phone]* (retail)       [address]       [place], [place]  [address]       Ph: [phone]       Fax: [phone]   RxID:   [id number]

## 2010-10-26 NOTE — Letter (Signed)
Summary: [facility]  [facility]   Imported By: Lanelle Bal 07/29/2010 09:18:39  _____________________________________________________________________  External Attachment:    Type:   Image     Comment:   External Document

## 2010-10-26 NOTE — Progress Notes (Signed)
Summary: FYI/ NEW CONSULT  Phone Note From Other Clinic   Caller: kathleen- [facility] Call For: Mehek Grega Summary of Call: FYI: THIS PT WILL BE A NEW CONSULT FOR SLEEP/ SNORING. HOWEVER, DR LOWNE'S OFFICE CALLED AFTER THE APPT WAS MADE AND SAID THAT DR Laury Axon WANTED A PFT SCHEDULED FOR PT RE: COUGH/ TOBACCO USE. I ASKED "WHICH ISSUE NEEDED TO BE ADDRESSED FIRST AS KC WOULD SEE PT FOR "ONE OR THE OTHER" BUT NOT BOTH AT THE SAME TIME. SHE STATED THAT PT NEEDED TO BE SEEN FOR THE SNORING, BUT WANTED THE PFT DONE IN THE MEANTIME. NO CALL BACK NEEDED, I JUST WANTED KC AND NURSE TO BE AWARE IN CASE THERE IS ANY CONFUSION (AND THERE PROBABLY WILL BE) IN THE FUTURE. THANKS. KP Initial call taken by: Tivis Ringer, CNA,  January 04, 2010 3:43 PM  Follow-up for Phone Call        noted.  will forward message to [facility] so he is aware.  Arman Filter LPN  January 04, 2010 4:58 PM   Additional Follow-up for Phone Call Additional follow up Details #1::        noted.  Just make sure pfts were scheduled as Dr. Laury Axon requested. Additional Follow-up by: Barbaraann Share MD,  January 05, 2010 2:31 PM    Additional Follow-up for Phone Call Additional follow up Details #2::    pft's scheduled for 01-19-2010 at 4pm.  Arman Filter LPN  January 06, 2010 9:32 AM

## 2010-10-28 NOTE — Assessment & Plan Note (Signed)
Summary: dot physical//ph   Vital Signs:  Patient profile:   35 year old male Height:      70 inches Weight:      282.4 pounds BMI:     40.67 Pulse rate:   64 / minute Pulse rhythm:   regular BP sitting:   120 / 88  (right arm) Cuff size:   large  Vitals Entered By: Almeta Monas CMA Duncan Dull) (October 18, 2010 2:06 PM) CC: Dot Physical--O problems--declined Tdap  Vision Screening:Left eye with correction: 20 / 15 Right eye with correction: 20 / 25 Both eyes with correction: 20 / 25  Color vision testing: normal      Vision Entered By: Almeta Monas CMA Duncan Dull) (October 18, 2010 2:07 PM)   History of Present Illness: Pt here for DOT physical.  No complaints.     Preventive Screening-Counseling & Management  Alcohol-Tobacco     Alcohol drinks/day: 0     Smoking Status: current     Smoking Cessation Counseling: yes     Smoke Cessation Stage: ready     Packs/Day: 0.75     Year Started: 1995  Caffeine-Diet-Exercise     Caffeine use/day: 2     Does Patient Exercise: no     Exercise Counseling: to improve exercise regimen  Hep-HIV-STD-Contraception     Dental Visit-last 6 months no      Sexual History:  currently monogamous.    Problems Prior to Update: 1)  Depressive Disorder  (ICD-311) 2)  Hypothyroidism  (ICD-244.9) 3)  Hyperlipidemia  (ICD-272.4) 4)  Otitis Media, Acute, Bilateral  (ICD-382.9) 5)  Sinusitis - Acute-nos  (ICD-461.9) 6)  Thrombocytopenia  (ICD-287.5) 7)  Carpal Tunnel Syndrome, Bilateral  (ICD-354.0) 8)  Preventive Health Care  (ICD-V70.0) 9)  Cough  (ICD-786.2) 10)  Tobacco Use  (ICD-305.1) 11)  Snoring  (ICD-786.09) 12)  Family History of Cad Male 1st Degree Relative <50  (ICD-V17.3) 13)  Family History of Cad Male 1st Degree Relative <60  (ICD-V16.49) 14)  Family History Diabetes 1st Degree Relative  (ICD-V18.0)  Medications Prior to Update: 1)  Simcor 500-20 Mg Xr24h-Tab (Niacin-Simvastatin) .... Take 1 Tab At Bedtime 2)   Wellbutrin Xl 150 Mg Xr24h-Tab (Bupropion Hcl) .... 3 By Mouth Once Daily 3)  Levothyroxine Sodium 150 Mcg Tabs (Levothyroxine Sodium) .Marland Kitchen.. 1 Tab Once Daily  Current Medications (verified): 1)  Simcor 500-20 Mg Xr24h-Tab (Niacin-Simvastatin) .... Take 1 Tab At Bedtime 2)  Levothyroxine Sodium 150 Mcg Tabs (Levothyroxine Sodium) .Marland Kitchen.. 1 Tab Once Daily  Allergies (verified): No Known Drug Allergies  Past History:  Past Medical History: Last updated: 01/26/2010 Hyperlipidemia  Family History: Last updated: 04/12/2010 Heart Disease Family History Diabetes 1st degree relative Family History Hypertension Family History of CAD Male 1st degree relative @ 48yo Mother had hypothyroidism.  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 Heart Disease Family History Diabetes 1st degree relative Family History Hypertension Family History of CAD Male 1st degree relative @ 30yo Mother had hypothyroidism.  Social History: Reviewed history from 01/04/2010 and no changes required. 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 Eyes:  Denies blurring, discharge, double vision, eye irritation, eye pain, halos, itching, light sensitivity, red eye, vision loss-1 eye, and vision loss-both eyes; optho---   q2y. ENT:  Denies decreased hearing, difficulty swallowing, ear discharge, earache, hoarseness, nasal congestion, nosebleeds, postnasal drainage, ringing in ears, sinus pressure, and sore throat. CV:  Denies bluish discoloration of lips or nails, chest pain or discomfort, difficulty breathing at night, difficulty breathing while lying down, fainting, fatigue, leg cramps with exertion, lightheadness, near fainting, palpitations, shortness of breath with exertion, swelling of feet, swelling of hands, and weight gain. Resp:  Denies chest discomfort, chest pain with inspiration, cough, coughing up blood, excessive snoring, hypersomnolence, morning headaches, pleuritic, shortness of breath, sputum productive, and wheezing. GI:  Denies abdominal pain, bloody stools, change in bowel habits, constipation, dark tarry stools, diarrhea, excessive appetite, gas, hemorrhoids, indigestion, loss of appetite, nausea, vomiting, vomiting blood, and yellowish skin color. GU:  Denies decreased libido, discharge, dysuria, erectile dysfunction, genital sores, hematuria, incontinence, nocturia, urinary frequency, and urinary hesitancy. MS:  Denies joint pain, joint redness, joint swelling, loss of strength, low back pain, mid back pain, muscle aches, muscle , cramps, muscle weakness, stiffness, and thoracic pain. Derm:  Denies changes in color of skin, changes in nail beds, dryness, excessive perspiration, flushing, hair loss, insect bite(s), itching, lesion(s), poor wound healing, and rash. Neuro:  Denies brief paralysis, difficulty with concentration, disturbances in coordination, falling down, headaches, inability to speak, memory loss, numbness, poor balance, seizures, sensation of room spinning, tingling, tremors, visual disturbances, and weakness. Psych:  Denies alternate hallucination ( auditory/visual), anxiety, depression, easily angered, easily tearful, irritability, mental problems, panic attacks, sense of great  danger, suicidal thoughts/plans, thoughts of violence, unusual visions or sounds, and thoughts /plans of harming others. Endo:  Denies cold intolerance, excessive hunger, excessive thirst, excessive urination, heat intolerance, polyuria, and weight change. Allergy:  Denies hives or rash, itching eyes, persistent infections, seasonal allergies, and sneezing.  Physical Exam  General:  Well-developed,well-nourished,in no acute distress; alert,appropriate and cooperative throughout examination Head:  Normocephalic and atraumatic without obvious abnormalities. No apparent alopecia or balding. Eyes:  pupils equal, pupils round, pupils reactive to light, and no injection.   Ears:  External ear exam shows no significant lesions or deformities.  Otoscopic examination reveals clear canals, tympanic membranes are intact bilaterally without bulging, retraction, inflammation or discharge. Hearing is grossly normal bilaterally. Nose:  External nasal examination shows no deformity or inflammation. Nasal mucosa are pink and moist without lesions or exudates. Mouth:  Oral mucosa and oropharynx without lesions or exudates.  Teeth in good repair. Neck:  No deformities, masses, or tenderness noted.no carotid bruits.   Chest Wall:  No deformities, masses, tenderness or gynecomastia noted. Lungs:  Normal respiratory effort, chest expands symmetrically. Lungs are clear to auscultation, no crackles or wheezes. Heart:  normal rate and no murmur.   Abdomen:  Bowel sounds positive,abdomen soft and non-tender without masses, organomegaly or hernias noted. Genitalia:  Testes bilaterally descended without nodularity, tenderness or masses. No scrotal masses or lesions. No penis lesions or urethral discharge. Msk:  normal ROM, no joint tenderness, no joint swelling, no joint warmth, no redness over joints, no joint deformities, no joint instability, and no crepitation.   Pulses:  R and L carotid,radial,femoral,dorsalis pedis and  posterior tibial pulses are full and  equal bilaterally Extremities:  No clubbing, cyanosis, edema, or deformity noted with normal full range of motion of all joints.   Neurologic:  No cranial nerve deficits noted. Station and gait are normal. Plantar reflexes are down-going bilaterally. DTRs are symmetrical throughout. Sensory, motor and coordinative functions appear intact. Skin:  Intact without suspicious lesions or rashes Cervical Nodes:  No lymphadenopathy noted Axillary Nodes:  No palpable lymphadenopathy Psych:  Cognition and judgment appear intact. Alert and cooperative with normal attention span and concentration. No apparent delusions, illusions, hallucinations   Impression & Recommendations:  Problem # 1:  PREVENTIVE HEALTH CARE (ICD-V70.0)  check fasting labs ghm utd except tetanus --pt refused Reviewed preventive care protocols, scheduled due services, and updated immunizations.  Orders: Tobacco use cessation intermediate 3-10 minutes (82956)  Problem # 2:  HYPOTHYROIDISM (ICD-244.9)  His updated medication list for this problem includes:    Levothyroxine Sodium 150 Mcg Tabs (Levothyroxine sodium) .Marland Kitchen... 1 tab once daily  Labs Reviewed: TSH: 15.36 (04/12/2010)     Orders: Tobacco use cessation intermediate 3-10 minutes (21308)  Problem # 3:  THROMBOCYTOPENIA (ICD-287.5)  Orders: Tobacco use cessation intermediate 3-10 minutes (65784)  Problem # 4:  TOBACCO USE (ICD-305.1)  Encouraged smoking cessation and discussed different methods for smoking cessation.   Orders: Tobacco use cessation intermediate 3-10 minutes (99406)  Complete Medication List: 1)  Simcor 500-20 Mg Xr24h-tab (Niacin-simvastatin) .... Take 1 tab at bedtime 2)  Levothyroxine Sodium 150 Mcg Tabs (Levothyroxine sodium) .Marland Kitchen.. 1 tab once daily  Patient Instructions: 1)  fasting labs  244.9  272.4,  V70.0  cbcd, hep, lipid, TSH, bmp, UA   2)  Please schedule a follow-up appointment in 6 months .      Orders Added: 1)  Tobacco use cessation intermediate 3-10 minutes [99406] 2)  Est. Patient 18-39 years [99395]     Flu Vaccine Next Due:  Refused TD Next Due:  Refused

## 2010-11-03 NOTE — Letter (Signed)
Summary: DOT Physical Exam Form  DOT Physical Exam Form   Imported By: Lanelle Bal 10/26/2010 10:54:29  _____________________________________________________________________  External Attachment:    Type:   Image     Comment:   External Document

## 2010-12-07 LAB — CBC
Hemoglobin: 15.1 g/dL (ref 13.0–17.0)
MCH: 28.9 pg (ref 26.0–34.0)
MCHC: 34.8 g/dL (ref 30.0–36.0)

## 2010-12-07 LAB — DIFFERENTIAL
Basophils Relative: 0 % (ref 0–1)
Eosinophils Absolute: 0.3 10*3/uL (ref 0.0–0.7)
Eosinophils Relative: 3 % (ref 0–5)
Monocytes Relative: 7 % (ref 3–12)
Neutrophils Relative %: 67 % (ref 43–77)

## 2010-12-07 LAB — CHROMOSOME ANALYSIS, BONE MARROW

## 2011-01-05 ENCOUNTER — Encounter (HOSPITAL_BASED_OUTPATIENT_CLINIC_OR_DEPARTMENT_OTHER): Payer: BLUE CROSS/BLUE SHIELD | Admitting: Hematology & Oncology

## 2011-01-05 ENCOUNTER — Other Ambulatory Visit: Payer: Self-pay | Admitting: Hematology & Oncology

## 2011-01-05 DIAGNOSIS — D696 Thrombocytopenia, unspecified: Secondary | ICD-10-CM

## 2011-01-05 DIAGNOSIS — E039 Hypothyroidism, unspecified: Secondary | ICD-10-CM

## 2011-01-05 LAB — CBC WITH DIFFERENTIAL (CANCER CENTER ONLY)
BASO#: 0 10*3/uL (ref 0.0–0.2)
EOS%: 2.4 % (ref 0.0–7.0)
HCT: 42.9 % (ref 38.7–49.9)
HGB: 15.5 g/dL (ref 13.0–17.1)
LYMPH%: 21.6 % (ref 14.0–48.0)
MCH: 28.4 pg (ref 28.0–33.4)
MCHC: 36.1 g/dL — ABNORMAL HIGH (ref 32.0–35.9)
MCV: 79 fL — ABNORMAL LOW (ref 82–98)
MONO%: 4.7 % (ref 0.0–13.0)
NEUT#: 6.5 10*3/uL (ref 1.5–6.5)
NEUT%: 71.1 % (ref 40.0–80.0)

## 2011-03-10 ENCOUNTER — Other Ambulatory Visit: Payer: Self-pay | Admitting: Endocrinology

## 2011-03-10 MED ORDER — LORATADINE 10 MG PO TABS: 10.0000 mg | ORAL_TABLET | Freq: Every day | ORAL | Status: DC

## 2011-03-10 NOTE — Telephone Encounter (Signed)
R faxed    KP 

## 2011-05-29 ENCOUNTER — Other Ambulatory Visit: Payer: Self-pay | Admitting: Endocrinology

## 2011-06-30 ENCOUNTER — Other Ambulatory Visit: Payer: Self-pay | Admitting: Hematology & Oncology

## 2011-06-30 ENCOUNTER — Encounter (HOSPITAL_BASED_OUTPATIENT_CLINIC_OR_DEPARTMENT_OTHER): Payer: BLUE CROSS/BLUE SHIELD | Admitting: Hematology & Oncology

## 2011-06-30 DIAGNOSIS — D696 Thrombocytopenia, unspecified: Secondary | ICD-10-CM

## 2011-06-30 LAB — CBC WITH DIFFERENTIAL (CANCER CENTER ONLY)
BASO%: 0.3 % (ref 0.0–2.0)
HCT: 44.3 % (ref 38.7–49.9)
LYMPH%: 23.3 % (ref 14.0–48.0)
MCH: 30.5 pg (ref 28.0–33.4)
MCV: 83 fL (ref 82–98)
MONO%: 3.5 % (ref 0.0–13.0)
NEUT%: 70.1 % (ref 40.0–80.0)
Platelets: 70 10*3/uL — ABNORMAL LOW (ref 145–400)
RDW: 13.3 % (ref 11.1–15.7)

## 2011-06-30 LAB — CHCC SATELLITE - SMEAR

## 2011-08-01 ENCOUNTER — Other Ambulatory Visit: Payer: Self-pay | Admitting: Endocrinology

## 2011-09-28 ENCOUNTER — Other Ambulatory Visit: Payer: Self-pay | Admitting: Endocrinology

## 2011-11-10 ENCOUNTER — Ambulatory Visit (INDEPENDENT_AMBULATORY_CARE_PROVIDER_SITE_OTHER): Payer: BC Managed Care – PPO | Admitting: Family Medicine

## 2011-11-10 ENCOUNTER — Encounter: Payer: Self-pay | Admitting: Family Medicine

## 2011-11-10 VITALS — BP 118/84 | HR 83 | Temp 98.0°F | Ht 69.0 in | Wt 274.2 lb

## 2011-11-10 DIAGNOSIS — Z720 Tobacco use: Secondary | ICD-10-CM

## 2011-11-10 DIAGNOSIS — E039 Hypothyroidism, unspecified: Secondary | ICD-10-CM

## 2011-11-10 DIAGNOSIS — E785 Hyperlipidemia, unspecified: Secondary | ICD-10-CM

## 2011-11-10 DIAGNOSIS — Z0000 Encounter for general adult medical examination without abnormal findings: Secondary | ICD-10-CM

## 2011-11-10 DIAGNOSIS — F17200 Nicotine dependence, unspecified, uncomplicated: Secondary | ICD-10-CM

## 2011-11-10 DIAGNOSIS — D696 Thrombocytopenia, unspecified: Secondary | ICD-10-CM

## 2011-11-10 LAB — LIPID PANEL
Cholesterol: 208 mg/dL — ABNORMAL HIGH (ref 0–200)
HDL: 35.8 mg/dL — ABNORMAL LOW (ref >39.00)
Triglycerides: 251 mg/dL — ABNORMAL HIGH (ref 0.0–149.0)

## 2011-11-10 LAB — HEPATIC FUNCTION PANEL
ALT: 32 U/L (ref 0–53)
AST: 30 U/L (ref 0–37)
Albumin: 4.4 g/dL (ref 3.5–5.2)
Total Bilirubin: 0.8 mg/dL (ref 0.3–1.2)
Total Protein: 7.2 g/dL (ref 6.0–8.3)

## 2011-11-10 LAB — CBC WITH DIFFERENTIAL/PLATELET
Basophils Relative: 0.3 % (ref 0.0–3.0)
Eosinophils Absolute: 0.3 10*3/uL (ref 0.0–0.7)
Lymphocytes Relative: 18.4 % (ref 12.0–46.0)
MCHC: 34.7 g/dL (ref 30.0–36.0)
MCV: 87.1 fl (ref 78.0–100.0)
Monocytes Absolute: 0.6 10*3/uL (ref 0.1–1.0)
Neutrophils Relative %: 71 % (ref 43.0–77.0)
Platelets: 64 10*3/uL — ABNORMAL LOW (ref 150.0–400.0)
RBC: 5.28 Mil/uL (ref 4.22–5.81)
WBC: 9 10*3/uL (ref 4.5–10.5)

## 2011-11-10 LAB — BASIC METABOLIC PANEL
BUN: 15 mg/dL (ref 6–23)
CO2: 29 mEq/L (ref 19–32)
Calcium: 9.4 mg/dL (ref 8.4–10.5)
Creatinine, Ser: 1.1 mg/dL (ref 0.4–1.5)

## 2011-11-10 LAB — POCT URINALYSIS DIPSTICK
Leukocytes, UA: NEGATIVE
Nitrite, UA: NEGATIVE
Urobilinogen, UA: 0.2
pH, UA: 6

## 2011-11-10 LAB — TSH: TSH: 13.09 u[IU]/mL — ABNORMAL HIGH (ref 0.35–5.50)

## 2011-11-10 MED ORDER — VARENICLINE TARTRATE 0.5 MG X 11 & 1 MG X 42 PO MISC: ORAL | Status: AC

## 2011-11-10 NOTE — Assessment & Plan Note (Signed)
Pt may try chantix-- rx provided Call if any ?

## 2011-11-10 NOTE — Patient Instructions (Signed)
Preventative Care for Adults, Male A healthy lifestyle and preventative care can promote health and wellness. Preventative health guidelines for men include the following key practices:  A routine yearly physical is a good way to check with your caregiver about your health and preventative screening. It is a chance to share any concerns and updates on your health, and to receive a thorough exam.   Visit your dentist for a routine exam and preventative care every 6 months. Brush your teeth twice a day and floss once a day. Good oral hygiene prevents tooth decay and gum disease.   The frequency of eye exams is based on your age, health, family medical history, use of contact lenses, and other factors. Follow your caregiver's recommendations for frequency of eye exams.   Eat a healthy diet. Foods like vegetables, fruits, whole grains, low-fat dairy products, and lean protein foods contain the nutrients you need without too many calories. Decrease your intake of foods high in solid fats, added sugars, and salt. Eat the right amount of calories for you.Get information about a proper diet from your caregiver, if necessary.   Regular physical exercise is one of the most important things you can do for your health. Most adults should get at least 150 minutes of moderate-intensity exercise (any activity that increases your heart rate and causes you to sweat) each week. In addition, most adults need muscle-strengthening exercises on 2 or more days a week.   Maintain a healthy weight. The body mass index (BMI) is a screening tool to identify possible weight problems. It provides an estimate of body fat based on height and weight. Your caregiver can help determine your BMI, and can help you achieve or maintain a healthy weight.For adults 20 years and older:   A BMI below 18.5 is considered underweight.   A BMI of 18.5 to 24.9 is normal.   A BMI of 25 to 29.9 is considered overweight.   A BMI of 30 and  above is considered obese.   Maintain normal blood lipids and cholesterol levels by exercising and minimizing your intake of saturated fat. Eat a balanced diet with plenty of fruit and vegetables. Blood tests for lipids and cholesterol should begin at age 20 and be repeated every 5 years. If your lipid or cholesterol levels are high, you are over 50, or you are a high risk for heart disease, you may need your cholesterol levels checked more frequently.Ongoing high lipid and cholesterol levels should be treated with medicines if diet and exercise are not effective.   If you smoke, find out from your caregiver how to quit. If you do not use tobacco, do not start.   If you choose to drink alcohol, do not exceed 2 drinks per day. One drink is considered to be 12 ounces (355 mL) of beer, 5 ounces (148 mL) of wine, or 1.5 ounces (44 mL) of liquor.   Avoid use of street drugs. Do not share needles with anyone. Ask for help if you need support or instructions about stopping the use of drugs.   High blood pressure causes heart disease and increases the risk of stroke. Your blood pressure should be checked at least every 1 to 2 years. Ongoing high blood pressure should be treated with medicines, if weight loss and exercise are not effective.   If you are 45 to 34 years old, ask your caregiver if you should take aspirin to prevent heart disease.   Diabetes screening involves taking a blood   sample to check your fasting blood sugar level. This should be done once every 3 years, after age 45, if you are within normal weight and without risk factors for diabetes. Testing should be considered at a younger age or be carried out more frequently if you are overweight and have at least 1 risk factor for diabetes.   Colorectal cancer can be detected and often prevented. Most routine colorectal cancer screening begins at the age of 50 and continues through age 75. However, your caregiver may recommend screening at an  earlier age if you have risk factors for colon cancer. On a yearly basis, your caregiver may provide home test kits to check for hidden blood in the stool. Use of a small camera at the end of a tube, to directly examine the colon (sigmoidoscopy or colonoscopy), can detect the earliest forms of colorectal cancer. Talk to your caregiver about this at age 50, when routine screening begins. Direct examination of the colon should be repeated every 5 to 10 years through age 75, unless early forms of pre-cancerous polyps or small growths are found.   Practice safe sex. Use condoms and avoid high-risk sexual practices to reduce the spread of sexually transmitted infections (STIs). STIs include gonorrhea, chlamydia, syphilis, trichomonas, herpes, HPV, and human immunodeficiency virus (HIV). Herpes, HIV, and HPV are viral illnesses that have no cure. They can result in disability, cancer, and death.   A one-time screening for abdominal aortic aneurysm (AAA) and surgical repair of large AAAs by sound wave imaging (ultrasonography) is recommended for ages 65 to 75 years who are current or former smokers.   Healthy men should no longer receive prostate-specific antigen (PSA) blood tests as part of routine cancer screening. Consult with your caregiver about prostate cancer screening.   Use sunscreen with skin protection factor (SPF) of 30 or more. Apply sunscreen liberally and repeatedly throughout the day. You should seek shade when your shadow is shorter than you. Protect yourself by wearing long sleeves, pants, a wide-brimmed hat, and sunglasses year round, whenever you are outdoors.   Once a month, do a whole body skin exam, using a mirror to look at the skin on your back. Notify your caregiver of new moles, moles that have irregular borders, moles that are larger than a pencil eraser, or moles that have changed in shape or color.   Stay current with required immunizations.   Influenza. You need a dose every  fall (or winter). The composition of the flu vaccine changes each year, so being vaccinated once is not enough.   Pneumococcal polysaccharide. You need 1 to 2 doses if you smoke cigarettes or if you have certain chronic medical conditions. You need 1 dose at age 65 (or older) if you have never been vaccinated.   Tetanus, diphtheria, pertussis (Tdap, Td). Get 1 dose of Tdap vaccine if you are younger than age 65 years, are over 65 and have contact with an infant, are a healthcare worker, or simply want to be protected from whooping cough. After that, you need a Td booster dose every 10 years. Consult your caregiver if you have not had at least 3 tetanus and diphtheria-containing shots sometime in your life or have a deep or dirty wound.   HPV. This vaccine is recommended for males 13 through 34 years of age. This vaccine may be given to men 22 through 34 years of age who have not completed the 3 dose series. It is recommended for men through age 26   who have sex with men or whose immune system is weakened because of HIV infection, other illness, or medications. The vaccine is given in 3 doses over 6 months.   Measles, mumps, rubella (MMR). You need at least 1 dose of MMR if you were born in 1957 or later. You may also need a 2nd dose.   Meningococcal. If you are age 19 to 21 years and a first-year college student living in a residence hall, or have one of several medical conditions, you need to get vaccinated against meningococcal disease. You may also need additional booster doses.   Zoster (shingles). If you are age 60 years or older, you should get this vaccine.   Varicella (chickenpox). If you have never had chickenpox or you were vaccinated but received only 1 dose, talk to your caregiver to find out if you need this vaccine.   Hepatitis A. You need this vaccine if you have a specific risk factor for hepatitis A virus infection, or you simply wish to be protected from this disease. The vaccine is  usually given as 2 doses, 6 to 18 months apart.   Hepatitis B. You need this vaccine if you have a specific risk factor for hepatitis B virus infection or you simply wish to be protected from this disease. The vaccine is given in 3 doses, usually over 6 months.  Preventative Service / Frequency Ages 19 to 39  Blood pressure check.** / Every 1 to 2 years.   Lipid and cholesterol check.**/ Every 5 years beginning at age 20.   Skin self-exam. / Monthly.   Influenza immunization.** / Every year.   Pneumococcal polysaccharide immunization.** / 1 to 2 doses if you smoke cigarettes or if you have certain chronic medical conditions.   Tetanus, diphtheria, pertussis (Tdap,Td) immunization. / A one-time dose of Tdap vaccine. After that, you need a Td booster dose every 10 years.   HPV immunization. / 3 doses over 6 months, if 26 and younger.   Measles, mumps, rubella (MMR) immunization. / You need at least 1 dose of MMR if you were born in 1957 or later. You may also need a 2nd dose.   Meningococcal immunization. / 1 dose if you are age 19 to 21 years and a first-year college student living in a residence hall, or have one of several medical conditions, you need to get vaccinated against meningococcal disease. You may also need additional booster doses.   Varicella immunization. **/ Consult your caregiver.   Hepatitis A immunization. ** / Consult your caregiver. 2 doses, 6 to 18 months apart.   Hepatitis B immunization.** / Consult your caregiver. 3 doses usually over 6 months.  Ages 40 to 64  Blood pressure check.** / Every 1 to 2 years.   Lipid and cholesterol check.**/ Every 5 years beginning at age 20.   Fecal occult blood test (FOBT) of stool. / Every year beginning at age 50 and continuing until age 75. You may not have to do this test if you get colonoscopy every 10 years.   Flexible sigmoidoscopy** or colonoscopy.** / Every 5 years for a flexible sigmoidoscopy or every 10 years for  a colonoscopy beginning at age 50 and continuing until age 75.   Skin self-exam. / Monthly.   Influenza immunization.** / Every year.   Pneumococcal polysaccharide immunization.** / 1 to 2 doses if you smoke cigarettes or if you have certain chronic medical conditions.   Tetanus, diphtheria, pertussis (Tdap/Td) immunization.** / A one-time dose of   Tdap vaccine. After that, you need a Td booster dose every 10 years.   Measles, mumps, rubella (MMR) immunization. / You need at least 1 dose of MMR if you were born in 1957 or later. You may also need a 2nd dose.   Varicella immunization. **/ Consult your caregiver.   Meningococcal immunization.** / Consult your caregiver.   Hepatitis A immunization. ** / Consult your caregiver. 2 doses, 6 to 18 months apart.   Hepatitis B immunization.** / Consult your caregiver. 3 doses, usually over 6 months.  Ages 65 and over  Blood pressure check.** / Every 1 to 2 years.   Lipid and cholesterol check.**/ Every 5 years beginning at age 20.   Fecal occult blood test (FOBT) of stool. / Every year beginning at age 50 and continuing until age 75. You may not have to do this test if you get colonoscopy every 10 years.   Flexible sigmoidoscopy** or colonoscopy.** / Every 5 years for a flexible sigmoidoscopy or every 10 years for a colonoscopy beginning at age 50 and continuing until age 75.   Abdominal aortic aneurysm (AAA) screening.** / A one-time screening for ages 65 to 75 years who are current or former smokers.   Skin self-exam. / Monthly.   Influenza immunization.** / Every year.   Pneumococcal polysaccharide immunization.** / 1 dose at age 65 (or older) if you have never been vaccinated.   Tetanus, diphtheria, pertussis (Tdap, Td) immunization. / A one-time dose of Tdap vaccine if you are over 65 and have contact with an infant, are a healthcare worker, or simply want to be protected from whooping cough. After that, you need a Td booster dose  every 10 years.   Varicella immunization. **/ Consult your caregiver.   Meningococcal immunization.** / Consult your caregiver.   Hepatitis A immunization. ** / Consult your caregiver. 2 doses, 6 to 18 months apart.   Hepatitis B immunization.** / Check with your caregiver. 3 doses, usually over 6 months.  **Family history and personal history of risk and conditions may change your caregiver's recommendations. Document Released: 11/08/2001 Document Revised: 05/25/2011 Document Reviewed: 02/07/2011 [employer] Patient Information 2012 [employer]. 

## 2011-11-10 NOTE — Assessment & Plan Note (Signed)
Check labs con't meds 

## 2011-11-10 NOTE — Progress Notes (Signed)
Subjective:    Patient ID: Daniel Acevedo, male    DOB: 06-Mar-1976, 36 y.o.   MRN: [id number]  HPI Pt here for cpe and labs and to have meds renewed.  No complaints   Review of Systems Review of Systems  Constitutional: Negative for activity change, appetite change and fatigue.  HENT: Negative for hearing loss, congestion, tinnitus and ear discharge.  dentist-- due Eyes: Negative for visual disturbance (see optho q2y -- vision corrected to 20/20 with glasses).  Respiratory: Negative for cough, chest tightness and shortness of breath.   Cardiovascular: Negative for chest pain, palpitations and leg swelling.  Gastrointestinal: Negative for abdominal pain, diarrhea, constipation and abdominal distention.  Genitourinary: Negative for urgency, frequency, decreased urine volume and difficulty urinating.  Musculoskeletal: Negative for back pain, arthralgias and gait problem.  Skin: Negative for color change, pallor and rash.  Neurological: Negative for dizziness, light-headedness, numbness and headaches.  Hematological: Negative for adenopathy. Does not bruise/bleed easily.  Psychiatric/Behavioral: Negative for suicidal ideas, confusion, sleep disturbance, self-injury, dysphoric mood, decreased concentration and agitation.    Past Medical History  Diagnosis Date  . HYPOTHYROIDISM 03/15/2010  . HYPERLIPIDEMIA 01/26/2010  . THROMBOCYTOPENIA 01/12/2010  . DEPRESSIVE DISORDER 04/27/2010  . CARPAL TUNNEL SYNDROME, BILATERAL 01/04/2010   History   Social History  . Marital Status: Married    Spouse Name: N/A    Number of Children: N/A  . Years of Education: N/A   Occupational History  .  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 Smoking status: Smoker, Current Status Unknown -- 1.5 packs/day for 18 years  . Smokeless tobacco: Not on file   Comment: pt has tried many otc--- and wellbutrin with no success  . Alcohol Use: No  . Drug Use: No  . Sexually Active: Yes --  Male partner(s)   Other Topics Concern  . Not on file   Social History Narrative   Regular exercise-no   Family History  Problem Relation Age of Onset  . Thyroid disease Mother     hypothyroidism  . Hypertension Mother   . Heart disease Father     cabg  . Diabetes Father   . Kidney disease Father   . Hypertension Father   . Hypertension Brother    Current Outpatient Prescriptions on File Prior to Visit  Medication Sig Dispense Refill  . levothyroxine (SYNTHROID, LEVOTHROID) 150 MCG tablet TAKE 1 TABLET ONCE DAILY  30 tablet  0  . loratadine (CLARITIN) 10 MG tablet Take 1 tablet (10 mg total) by mouth daily.  30 tablet  1  . niacin-simvastatin (SIMCOR) 500-20 MG 24 hr tablet Take 1 tablet by mouth at bedtime.              Objective:   Physical Exam BP 118/84  Pulse 83  Temp(Src) 98 F (36.7 C) (Oral)  Ht 5\' 9"  (1.753 m)  Wt 274 lb 3.2 oz (124.376 kg)  BMI 40.49 kg/m2  SpO2 98%  General Appearance:    Alert, cooperative, no distress, appears stated age  Head:    Normocephalic, without obvious abnormality, atraumatic  Eyes:    PERRL, conjunctiva/corneas clear, EOM's intact, fundi    benign, both eyes       Ears:    Normal TM's and external ear canals, both ears  Nose:   Nares normal, septum midline, mucosa normal, no drainage   or sinus tenderness  Throat:   Lips, mucosa, and tongue normal; teeth and  gums normal  Neck:   Supple, symmetrical, trachea midline, no adenopathy;       thyroid:  No enlargement/tenderness/nodules; no carotid   bruit or JVD  Back:     Symmetric, no curvature, ROM normal, no CVA tenderness  Lungs:     Clear to auscultation bilaterally, respirations unlabored  Chest wall:    No tenderness or deformity  Heart:    Regular rate and rhythm, S1 and S2 normal, no murmur, rub   or gallop  Abdomen:     Soft, non-tender, bowel sounds active all four quadrants,    no masses, no organomegaly  Genitalia:    Normal male without lesion, discharge or  tenderness  Rectal:  deferred  Extremities:   Extremities normal, atraumatic, no cyanosis or edema  Pulses:   2+ and symmetric all extremities  Skin:   Skin color, texture, turgor normal, no rashes or lesions  Lymph nodes:   Cervical, supraclavicular, and axillary nodes normal  Neurologic:   CNII-XII intact. Normal strength, sensation and reflexes      throughout          Assessment & Plan:  cpe-- ghm utd            Check labs

## 2011-11-10 NOTE — Assessment & Plan Note (Signed)
Per heme.  

## 2011-11-16 ENCOUNTER — Encounter: Payer: Self-pay | Admitting: Family Medicine

## 2011-11-17 ENCOUNTER — Other Ambulatory Visit: Payer: Self-pay | Admitting: *Deleted

## 2011-11-17 ENCOUNTER — Encounter: Payer: Self-pay | Admitting: *Deleted

## 2011-11-17 MED ORDER — ATORVASTATIN CALCIUM 20 MG PO TABS: 20.0000 mg | ORAL_TABLET | Freq: Every day | ORAL | Status: DC

## 2011-11-17 MED ORDER — LEVOTHYROXINE SODIUM 200 MCG PO TABS: 200.0000 ug | ORAL_TABLET | Freq: Every day | ORAL | Status: DC

## 2011-12-29 ENCOUNTER — Ambulatory Visit (HOSPITAL_BASED_OUTPATIENT_CLINIC_OR_DEPARTMENT_OTHER): Payer: BC Managed Care – PPO | Admitting: Hematology & Oncology

## 2011-12-29 ENCOUNTER — Other Ambulatory Visit (HOSPITAL_BASED_OUTPATIENT_CLINIC_OR_DEPARTMENT_OTHER): Payer: BC Managed Care – PPO | Admitting: Lab

## 2011-12-29 VITALS — BP 123/85 | HR 85 | Temp 97.7°F | Ht 69.0 in | Wt 270.0 lb

## 2011-12-29 DIAGNOSIS — D693 Immune thrombocytopenic purpura: Secondary | ICD-10-CM

## 2011-12-29 DIAGNOSIS — D696 Thrombocytopenia, unspecified: Secondary | ICD-10-CM

## 2011-12-29 LAB — CBC WITH DIFFERENTIAL (CANCER CENTER ONLY)
EOS%: 2.9 % (ref 0.0–7.0)
Eosinophils Absolute: 0.3 10*3/uL (ref 0.0–0.5)
LYMPH%: 20.2 % (ref 14.0–48.0)
MCH: 29.9 pg (ref 28.0–33.4)
MCHC: 36.4 g/dL — ABNORMAL HIGH (ref 32.0–35.9)
MCV: 82 fL (ref 82–98)
MONO%: 6.8 % (ref 0.0–13.0)
NEUT#: 6.5 10*3/uL (ref 1.5–6.5)
Platelets: 54 10*3/uL — ABNORMAL LOW (ref 145–400)
RBC: 5.22 10*6/uL (ref 4.20–5.70)
RDW: 12.9 % (ref 11.1–15.7)

## 2011-12-29 LAB — CHCC SATELLITE - SMEAR

## 2011-12-29 NOTE — Progress Notes (Signed)
This office note has been dictated.

## 2011-12-30 NOTE — Progress Notes (Signed)
CC:   Daniel Perla, DO  DIAGNOSIS:  Chronic immune thrombocytopenia.  CURRENT THERAPY:  Observation.  INTERIM HISTORY:  Daniel Acevedo comes in for followup.  I see him every 6 months.  He is doing great.  He works for [employer].  He is doing well with Daniel Acevedo.  He has had no problem with bleeding or bruising.  He has had a good appetite.  He has had no change in bowel or bladder habits.  There has been no headache.  PHYSICAL EXAMINATION:  General:  This is a well-developed, well- nourished white gentleman in no obvious distress.  Vital Signs: Temperature 97.7, pulse 85, respiratory rate 16, blood pressure 123/85, weight is 270.  Head Neck Exam:  Shows a normocephalic, atraumatic skull.  There are no ocular or oral lesions.  There are no palpable cervical or supraclavicular lymph nodes.  Lungs:  Clear bilaterally. Cardiac Exam:  Regular rate and rhythm with a normal S1 and S2.  There are no murmurs, rubs, or bruits.  Abdominal Exam:  Soft with good bowel sounds.  There is no palpable abdominal mass.  There is no palpable hepatosplenomegaly.  Back Exam:  No tenderness over the spine, ribs, or hips.  Extremities:  Show no clubbing, cyanosis, or edema.  Neurological Exam:  Shows no focal neurological deficits.  Skin Exam:  No rashes, ecchymosis, or petechiae.  LABORATORY STUDIES:  White cell count 9.3, hemoglobin 13.6, hematocrit 42.8, platelet count 54,000.  Peripheral smear does show a normochromic, normocytic population of red blood cells.  There are no nucleated red blood cells.  I see no rouleaux formation.  There are no schistocytes.  White cells appear normal in morphology and maturation.  There are no hypersegmented polys.  I see no immature myeloid cells.  There are no atypical lymphocytes.  Platelets are decreased in number.  He has several large platelets.  IMPRESSION:  Daniel Acevedo is a 36 year old gentleman with chronic immune thrombocytopenia.  We have been seeing him now for  over a year.  His platelet count does tend to fluctuate, but typically stays within a range.  Again, he is asymptomatic.  I do not see a need for doing any additional testing on him.  We did do a bone marrow on him back in November 2011.  The bone marrow was consistent with immune thrombocytopenia with numerous platelets ([id number]).  We will plan to get him back in 6 more months.  I told him that he could certainly come back to see [place] sooner if he begins to have problems.    ______________________________ Josph Macho, M.D. PRE/MEDQ  D:  12/29/2011  T:  12/30/2011  Job:  [id number]

## 2012-02-21 ENCOUNTER — Other Ambulatory Visit: Payer: Self-pay | Admitting: Endocrinology

## 2012-02-21 MED ORDER — LEVOTHYROXINE SODIUM 200 MCG PO TABS: 200.0000 ug | ORAL_TABLET | Freq: Every day | ORAL | Status: DC

## 2012-02-21 NOTE — Telephone Encounter (Signed)
Refill levothyroxine tablet Qty 30 Take one tablet every day  Last fill 4.21.13 Last OV 2.14.13

## 2012-03-21 ENCOUNTER — Other Ambulatory Visit: Payer: Self-pay | Admitting: Endocrinology

## 2012-03-21 NOTE — Telephone Encounter (Signed)
Err. Already sent 5.28.13

## 2012-03-23 ENCOUNTER — Telehealth: Payer: Self-pay | Admitting: Family Medicine

## 2012-03-23 MED ORDER — LEVOTHYROXINE SODIUM 200 MCG PO TABS: 200.0000 ug | ORAL_TABLET | Freq: Every day | ORAL | Status: DC

## 2012-03-23 NOTE — Telephone Encounter (Signed)
Dr Ernst Spell pt- will forward to her

## 2012-03-23 NOTE — Telephone Encounter (Signed)
Refill: Levothyroxine tablet. Take 1 tablet by mouth daily. Qty 30. Last fill 02-21-12

## 2012-03-23 NOTE — Telephone Encounter (Signed)
He should be seeing Endo because his last TSH was abnormal

## 2012-04-25 ENCOUNTER — Other Ambulatory Visit (INDEPENDENT_AMBULATORY_CARE_PROVIDER_SITE_OTHER): Payer: BC Managed Care – PPO

## 2012-04-25 DIAGNOSIS — E785 Hyperlipidemia, unspecified: Secondary | ICD-10-CM

## 2012-04-25 DIAGNOSIS — E039 Hypothyroidism, unspecified: Secondary | ICD-10-CM

## 2012-04-25 LAB — LIPID PANEL
Cholesterol: 175 mg/dL (ref 0–200)
LDL Cholesterol: 114 mg/dL — ABNORMAL HIGH (ref 0–99)
Total CHOL/HDL Ratio: 5

## 2012-04-25 LAB — HEPATIC FUNCTION PANEL
Alkaline Phosphatase: 101 U/L (ref 39–117)
Bilirubin, Direct: 0.1 mg/dL (ref 0.0–0.3)
Total Bilirubin: 0.8 mg/dL (ref 0.3–1.2)
Total Protein: 7 g/dL (ref 6.0–8.3)

## 2012-05-02 ENCOUNTER — Telehealth: Payer: Self-pay | Admitting: *Deleted

## 2012-05-02 MED ORDER — LEVOTHYROXINE SODIUM 200 MCG PO TABS: 200.0000 ug | ORAL_TABLET | Freq: Every day | ORAL | Status: DC

## 2012-05-02 NOTE — Telephone Encounter (Signed)
Pt calling for results of labs done on 04-25-12. Please advise

## 2012-05-02 NOTE — Telephone Encounter (Signed)
Discussed with patient apt scheduled for Friday with Dr.Lowne     KP

## 2012-05-02 NOTE — Telephone Encounter (Signed)
LFT elevated---he should only be on lipitor and not simcor--- if he is only taking lipitor--is he taking more tylenol than usual or etoh or other otc med? Recheck liver in 2 weeks -----hep, ggt  790.4

## 2012-05-04 ENCOUNTER — Other Ambulatory Visit: Payer: Self-pay | Admitting: Family Medicine

## 2012-05-04 ENCOUNTER — Other Ambulatory Visit: Payer: BC Managed Care – PPO

## 2012-05-04 ENCOUNTER — Ambulatory Visit (INDEPENDENT_AMBULATORY_CARE_PROVIDER_SITE_OTHER): Payer: BC Managed Care – PPO | Admitting: Family Medicine

## 2012-05-04 ENCOUNTER — Encounter: Payer: Self-pay | Admitting: Family Medicine

## 2012-05-04 ENCOUNTER — Telehealth: Payer: Self-pay

## 2012-05-04 VITALS — BP 134/86 | HR 60 | Temp 98.2°F | Wt 273.4 lb

## 2012-05-04 DIAGNOSIS — R5381 Other malaise: Secondary | ICD-10-CM

## 2012-05-04 DIAGNOSIS — R748 Abnormal levels of other serum enzymes: Secondary | ICD-10-CM

## 2012-05-04 DIAGNOSIS — R5383 Other fatigue: Secondary | ICD-10-CM

## 2012-05-04 DIAGNOSIS — D696 Thrombocytopenia, unspecified: Secondary | ICD-10-CM

## 2012-05-04 LAB — CBC WITH DIFFERENTIAL/PLATELET
Basophils Relative: 0.3 % (ref 0.0–3.0)
HCT: 47.7 % (ref 39.0–52.0)
Hemoglobin: 16.3 g/dL (ref 13.0–17.0)
Lymphocytes Relative: 17.9 % (ref 12.0–46.0)
MCHC: 34.1 g/dL (ref 30.0–36.0)
Monocytes Relative: 6.5 % (ref 3.0–12.0)
Neutro Abs: 5.8 10*3/uL (ref 1.4–7.7)
RBC: 5.62 Mil/uL (ref 4.22–5.81)

## 2012-05-04 LAB — BASIC METABOLIC PANEL
CO2: 27 mEq/L (ref 19–32)
Calcium: 9.4 mg/dL (ref 8.4–10.5)
Potassium: 4.3 mEq/L (ref 3.5–5.1)
Sodium: 137 mEq/L (ref 135–145)

## 2012-05-04 LAB — HEPATIC FUNCTION PANEL
AST: 28 U/L (ref 0–37)
Albumin: 4.1 g/dL (ref 3.5–5.2)
Alkaline Phosphatase: 100 U/L (ref 39–117)
Total Protein: 7 g/dL (ref 6.0–8.3)

## 2012-05-04 NOTE — Progress Notes (Signed)
  Subjective:     Daniel Acevedo is a 36 y.o. male who presents for evaluation of fatigue. Symptoms began 2 months ago. The patient feels the fatigue began with: snoring. Symptoms of his fatigue have been general malaise and hypersomnolence. Patient describes the following psychological symptoms: none. Patient denies change in hair texture, cold intolerance, constipation, fever, GI blood loss, significant change in weight, symptoms of arthritis and unusual rashes. Symptoms have stabilized. Symptom severity: struggles to carry out day to day responsibilities.. Previous visits for this problem: none.   The following portions of the patient's history were reviewed and updated as appropriate: allergies, current medications, past family history, past medical history, past social history, past surgical history and problem list.  Review of Systems Pertinent items are noted in HPI.    Objective:    BP 134/86  Pulse 60  Temp 98.2 F (36.8 C) (Oral)  Wt 273 lb 6.4 oz (124.013 kg)  SpO2 97% General appearance: alert, cooperative, appears stated age and no distress Lungs: clear to auscultation bilaterally Heart: S1, S2 normal Abdomen: soft, non-tender; bowel sounds normal; no masses,  no organomegaly Extremities: extremities normal, atraumatic, no cyanosis or edema    Assessment:    fatigue    Plan:    Discussed diagnosis with patient. See orders for lab evaluation. ? sleep apnea  Check labs---TSH recently normal

## 2012-05-04 NOTE — Patient Instructions (Signed)

## 2012-05-04 NOTE — Telephone Encounter (Signed)
Critical Platelet count from [facility] lab count was 48 thousand.  To MD for review  KP

## 2012-05-04 NOTE — Telephone Encounter (Signed)
Refer to hematology 

## 2012-05-05 ENCOUNTER — Other Ambulatory Visit: Payer: Self-pay | Admitting: Family Medicine

## 2012-05-07 LAB — EPSTEIN-BARR VIRUS VCA ANTIBODY PANEL
EBV EA IgG: 35.6 U/mL — ABNORMAL HIGH (ref <9.0)
EBV NA IgG: >600 U/mL — ABNORMAL HIGH (ref <18.0)
EBV VCA IgG: >750 U/mL — ABNORMAL HIGH (ref <18.0)
EBV VCA IgM: <10 U/mL (ref <36.0)

## 2012-05-07 NOTE — Telephone Encounter (Signed)
Labs routed to Dr.Ennever--    KP

## 2012-05-07 NOTE — Telephone Encounter (Signed)
Daniel Perla, DO More Detail >>      Daniel Perla, DO        Sent: Sat May 05, 2012 11:33 AM    To: Arnette Norris, CMA    Cc: Hobert Poplaski    MRN: [id number] DOB: 1975-10-02     Pt Work: [phone] Pt Home: [phone]           Message     Pt already sees hematology-- place just send labs to dr Brantley Stage    Disregard referral

## 2012-06-28 ENCOUNTER — Other Ambulatory Visit: Payer: BC Managed Care – PPO | Admitting: Lab

## 2012-06-28 ENCOUNTER — Ambulatory Visit: Payer: BC Managed Care – PPO | Admitting: Hematology & Oncology

## 2012-07-27 ENCOUNTER — Telehealth: Payer: Self-pay | Admitting: Hematology & Oncology

## 2012-07-27 NOTE — Telephone Encounter (Signed)
Pt cx 11-4 said would call back to reschedule

## 2012-07-31 ENCOUNTER — Other Ambulatory Visit: Payer: BC Managed Care – PPO | Admitting: Lab

## 2012-07-31 ENCOUNTER — Ambulatory Visit: Payer: BC Managed Care – PPO | Admitting: Hematology & Oncology

## 2012-08-28 ENCOUNTER — Other Ambulatory Visit: Payer: Self-pay | Admitting: Family Medicine

## 2012-08-28 NOTE — Telephone Encounter (Signed)
Rx sent.    MW 

## 2012-11-01 ENCOUNTER — Ambulatory Visit (INDEPENDENT_AMBULATORY_CARE_PROVIDER_SITE_OTHER): Payer: BC Managed Care – PPO | Admitting: Family Medicine

## 2012-11-01 ENCOUNTER — Encounter: Payer: Self-pay | Admitting: Family Medicine

## 2012-11-01 VITALS — BP 130/82 | HR 78 | Temp 98.3°F | Wt 280.6 lb

## 2012-11-01 DIAGNOSIS — F419 Anxiety disorder, unspecified: Secondary | ICD-10-CM | POA: Insufficient documentation

## 2012-11-01 DIAGNOSIS — F411 Generalized anxiety disorder: Secondary | ICD-10-CM

## 2012-11-01 LAB — CBC WITH DIFFERENTIAL/PLATELET
Basophils Absolute: 0 10*3/uL (ref 0.0–0.1)
Basophils Relative: 0.2 % (ref 0.0–3.0)
HCT: 48.1 % (ref 39.0–52.0)
Hemoglobin: 16.4 g/dL (ref 13.0–17.0)
Lymphs Abs: 1.6 10*3/uL (ref 0.7–4.0)
Monocytes Relative: 6.7 % (ref 3.0–12.0)
Neutro Abs: 5.8 10*3/uL (ref 1.4–7.7)
RBC: 5.53 Mil/uL (ref 4.22–5.81)
RDW: 13.2 % (ref 11.5–14.6)

## 2012-11-01 LAB — BASIC METABOLIC PANEL
CO2: 25 mEq/L (ref 19–32)
GFR: 88.73 mL/min (ref >60.00)
Glucose, Bld: 94 mg/dL (ref 70–99)
Potassium: 4.5 mEq/L (ref 3.5–5.1)
Sodium: 138 mEq/L (ref 135–145)

## 2012-11-01 MED ORDER — ALPRAZOLAM 0.25 MG PO TABS: 0.2500 mg | ORAL_TABLET | Freq: Three times a day (TID) | ORAL | Status: DC | PRN

## 2012-11-01 MED ORDER — ESCITALOPRAM OXALATE 10 MG PO TABS
10.0000 mg | ORAL_TABLET | Freq: Every day | ORAL | Status: DC
Start: 2012-11-01 — End: 2013-02-11

## 2012-11-01 MED ORDER — ALPRAZOLAM 0.25 MG PO TABS: ORAL_TABLET | ORAL | Status: DC

## 2012-11-01 NOTE — Assessment & Plan Note (Signed)
lexapro  Xanax rto 1 month or sooner prn Recommend counseling as well

## 2012-11-01 NOTE — Progress Notes (Signed)
  Subjective:     Daniel Acevedo is a 37 y.o. male who presents for new evaluation and treatment of anxiety disorder and panic attacks. He has the following anxiety symptoms: feelings of losing control, palpitations and panic attacks. Onset of symptoms was approximately several years ago. Symptoms have been gradually worsening since that time. He denies current suicidal and homicidal ideation. Family history significant for anxiety. Risk factors: positive family history in  parents. Previous treatment includes nothing. He complains of the following medication side effects: none.  He feels he is losing patience with the kids quicker.   Most symptoms occur at work.    The following portions of the patient's history were reviewed and updated as appropriate: allergies, current medications, past family history, past medical history, past social history, past surgical history and problem list.  Review of Systems Pertinent items are noted in HPI.    Objective:    BP 130/82  Pulse 78  Temp 98.3 F (36.8 C) (Oral)  Wt 280 lb 9.6 oz (127.279 kg)  SpO2 98% General appearance: alert, cooperative, appears stated age and no distress Lungs: clear to auscultation bilaterally Heart: S1, S2 normal psych-- anxious sitting in chair,  cant sit still,   doesn't know where to look    Assessment:    anxiety disorder and panic attacks. Possible organic contributing causes are: none.   Plan:    Medications: Lexapro. Labs: Comprehensive metabolic profile, CBC and TSH. Handouts describing disease, natural history, and treatment were given to the patient. Reviewed concept of anxiety as biochemical imbalance of neurotransmitters and rationale for treatment. Instructed patient to contact office or on-call physician promptly should condition worsen or any new symptoms appear and provided on-call telephone numbers. IF THE PATIENT HAS ANY SUICIDAL OR HOMICIDAL IDEATIONS, CALL THE OFFICE, DISCUSS WITH A SUPPORT MEMBER,  OR GO TO THE ER IMMEDIATELY. Patient was agreeable with this plan. Follow up: 1 month. -- or sooner prn Will also give prn xanax--d/w pt use and abuse potential

## 2012-11-01 NOTE — Patient Instructions (Signed)

## 2012-11-10 ENCOUNTER — Other Ambulatory Visit: Payer: Self-pay

## 2012-12-08 ENCOUNTER — Other Ambulatory Visit: Payer: Self-pay | Admitting: Family Medicine

## 2013-02-11 ENCOUNTER — Other Ambulatory Visit: Payer: Self-pay | Admitting: General Practice

## 2013-02-11 DIAGNOSIS — F419 Anxiety disorder, unspecified: Secondary | ICD-10-CM

## 2013-02-11 MED ORDER — ESCITALOPRAM OXALATE 10 MG PO TABS: 10.0000 mg | ORAL_TABLET | Freq: Every day | ORAL | Status: DC

## 2013-02-11 NOTE — Telephone Encounter (Signed)
Med filled.  

## 2013-05-14 ENCOUNTER — Other Ambulatory Visit: Payer: Self-pay | Admitting: Family Medicine

## 2013-07-08 ENCOUNTER — Other Ambulatory Visit: Payer: Self-pay | Admitting: Family Medicine

## 2013-08-01 ENCOUNTER — Encounter: Payer: Self-pay | Admitting: Family Medicine

## 2013-08-01 ENCOUNTER — Other Ambulatory Visit: Payer: Self-pay

## 2013-08-01 ENCOUNTER — Ambulatory Visit (INDEPENDENT_AMBULATORY_CARE_PROVIDER_SITE_OTHER): Payer: BC Managed Care – PPO | Admitting: Family Medicine

## 2013-08-01 VITALS — BP 132/88 | HR 70 | Temp 97.8°F | Wt 281.0 lb

## 2013-08-01 DIAGNOSIS — R5381 Other malaise: Secondary | ICD-10-CM

## 2013-08-01 DIAGNOSIS — E039 Hypothyroidism, unspecified: Secondary | ICD-10-CM

## 2013-08-01 DIAGNOSIS — F419 Anxiety disorder, unspecified: Secondary | ICD-10-CM

## 2013-08-01 DIAGNOSIS — E785 Hyperlipidemia, unspecified: Secondary | ICD-10-CM

## 2013-08-01 DIAGNOSIS — F411 Generalized anxiety disorder: Secondary | ICD-10-CM

## 2013-08-01 LAB — BASIC METABOLIC PANEL
BUN: 13 mg/dL (ref 6–23)
CO2: 29 mEq/L (ref 19–32)
Chloride: 105 mEq/L (ref 96–112)
Creatinine, Ser: 1 mg/dL (ref 0.4–1.5)
Glucose, Bld: 77 mg/dL (ref 70–99)
Potassium: 4.2 mEq/L (ref 3.5–5.1)

## 2013-08-01 LAB — CBC WITH DIFFERENTIAL/PLATELET
Basophils Absolute: 0 10*3/uL (ref 0.0–0.1)
Basophils Relative: 0.3 % (ref 0.0–3.0)
HCT: 45.2 % (ref 39.0–52.0)
Hemoglobin: 15.7 g/dL (ref 13.0–17.0)
Lymphocytes Relative: 23.5 % (ref 12.0–46.0)
MCHC: 34.8 g/dL (ref 30.0–36.0)
Monocytes Relative: 7.2 % (ref 3.0–12.0)
Neutro Abs: 4.5 10*3/uL (ref 1.4–7.7)
Neutrophils Relative %: 65.7 % (ref 43.0–77.0)
RBC: 5.33 Mil/uL (ref 4.22–5.81)
RDW: 13.7 % (ref 11.5–14.6)

## 2013-08-01 LAB — LIPID PANEL
Total CHOL/HDL Ratio: 5
VLDL: 34 mg/dL (ref 0.0–40.0)

## 2013-08-01 LAB — VITAMIN B12: Vitamin B-12: 908 pg/mL (ref 211–911)

## 2013-08-01 LAB — HEPATIC FUNCTION PANEL
AST: 26 U/L (ref 0–37)
Albumin: 4.1 g/dL (ref 3.5–5.2)
Alkaline Phosphatase: 96 U/L (ref 39–117)
Total Protein: 6.9 g/dL (ref 6.0–8.3)

## 2013-08-01 LAB — LDL CHOLESTEROL, DIRECT: Direct LDL: 141.4 mg/dL

## 2013-08-01 MED ORDER — ESCITALOPRAM OXALATE 10 MG PO TABS: 10.0000 mg | ORAL_TABLET | Freq: Every day | ORAL | Status: DC

## 2013-08-01 MED ORDER — ESCITALOPRAM OXALATE 20 MG PO TABS: 20.0000 mg | ORAL_TABLET | Freq: Every day | ORAL | Status: DC

## 2013-08-01 NOTE — Assessment & Plan Note (Signed)
Refill med 

## 2013-08-01 NOTE — Assessment & Plan Note (Signed)
Check labs today °con't synthroid °

## 2013-08-01 NOTE — Patient Instructions (Addendum)
rto for cpe     Cholesterol Cholesterol is a white, waxy, fat-like protein needed by your body in small amounts. The liver makes all the cholesterol you need. It is carried from the liver by the blood through the blood vessels. Deposits (plaque) may build up on blood vessel walls. This makes the arteries narrower and stiffer. Plaque increases the risk for heart attack and stroke. You cannot feel your cholesterol level even if it is very high. The only way to know is by a blood test to check your lipid (fats) levels. Once you know your cholesterol levels, you should keep a record of the test results. Work with your caregiver to to keep your levels in the desired range. WHAT THE RESULTS MEAN:  Total cholesterol is a rough measure of all the cholesterol in your blood.  LDL is the so-called bad cholesterol. This is the type that deposits cholesterol in the walls of the arteries. You want this level to be low.  HDL is the good cholesterol because it cleans the arteries and carries the LDL away. You want this level to be high.  Triglycerides are fat that the body can either burn for energy or store. High levels are closely linked to heart disease. DESIRED LEVELS:  Total cholesterol below 200.  LDL below 100 for people at risk, below 70 for very high risk.  HDL above 50 is good, above 60 is best.  Triglycerides below 150. HOW TO LOWER YOUR CHOLESTEROL:  Diet.  Choose fish or white meat chicken and [place], roasted or baked. Limit fatty cuts of red meat, fried foods, and processed meats, such as sausage and lunch meat.  Eat lots of fresh fruits and vegetables. Choose whole grains, beans, pasta, potatoes and cereals.  Use only small amounts of olive, corn or canola oils. Avoid butter, mayonnaise, shortening or palm kernel oils. Avoid foods with trans-fats.  Use skim/nonfat milk and low-fat/nonfat yogurt and cheeses. Avoid whole milk, cream, ice cream, egg yolks and cheeses. Healthy desserts  include angel food cake, ginger snaps, animal crackers, hard candy, popsicles, and low-fat/nonfat frozen yogurt. Avoid pastries, cakes, pies and cookies.  Exercise.  A regular program helps decrease LDL and raises HDL.  Helps with weight control.  Do things that increase your activity level like gardening, walking, or taking the stairs.  Medication.  May be prescribed by your caregiver to help lowering cholesterol and the risk for heart disease.  You may need medicine even if your levels are normal if you have several risk factors. HOME CARE INSTRUCTIONS   Follow your diet and exercise programs as suggested by your caregiver.  Take medications as directed.  Have blood work done when your caregiver feels it is necessary. MAKE SURE YOU:   Understand these instructions.  Will watch your condition.  Will get help right away if you are not doing well or get worse. Document Released: 06/07/2001 Document Revised: 12/05/2011 Document Reviewed: 11/28/2007 [facility] Patient Information 2014 [place], [place].

## 2013-08-01 NOTE — Progress Notes (Signed)
  Subjective:    Daniel Acevedo is a 37 y.o. male here for follow up of dyslipidemia. The patient does not use medications that may worsen dyslipidemias (corticosteroids, progestins, anabolic steroids, diuretics, beta-blockers, amiodarone, cyclosporine, olanzapine). The patient exercises infrequently. The patient is not known to have coexisting coronary artery disease.   Cardiac Risk Factors Age > 45-male, > 55-male:  NO  Smoking:   YES  +1  Sig. family hx of CHD*:  YES  +1  Hypertension:   NO  Diabetes:   NO  HDL < 35:   YES  +1  HDL > 59:   NO  Total: 3   *- Sig. family h/o CHD per NCEP = MI or sudden death at <55yo in  father or other 1st-degree male relative, or <65yo in mother or  other 1st-degree male relative  The following portions of the patient's history were reviewed and updated as appropriate: allergies, current medications, past family history, past medical history, past social history, past surgical history and problem list.  Review of Systems Pertinent items are noted in HPI.    Objective:    BP 132/88  Pulse 70  Temp(Src) 97.8 F (36.6 C) (Oral)  Wt 281 lb (127.461 kg)  SpO2 98% General appearance: alert, cooperative, appears stated age and no distress Nose: Nares normal. Septum midline. Mucosa normal. No drainage or sinus tenderness. Throat: lips, mucosa, and tongue normal; teeth and gums normal Neck: no adenopathy, no carotid bruit, no JVD, supple, symmetrical, trachea midline and thyroid not enlarged, symmetric, no tenderness/mass/nodules Lungs: clear to auscultation bilaterally Heart: S1, S2 normal Extremities: extremities normal, atraumatic, no cyanosis or edema Neurologic: Alert and oriented X 3, normal strength and tone. Normal symmetric reflexes. Normal coordination and gait  Lab Review Lab Results  Component Value Date   CHOL 175 04/25/2012   CHOL 208* 11/10/2011   CHOL 178 10/21/2010   HDL 38.50* 04/25/2012   HDL 35.80* 11/10/2011   HDL 32.30*  10/21/2010   LDLDIRECT 140.4 11/10/2011      Assessment:    Dyslipidemia as detailed above with 3 CHD risk factors using NCEP scheme above.  Target levels for LDL are: < 100 mg/dl (CHD or "CHD risk equivalent" is present)  Explained to the patient the respective contributions of genetics, diet, and exercise to lipid levels and the use of medication in severe cases which do not respond to lifestyle alteration. The patient's interest and motivation in making lifestyle changes seems fair.    Plan:    The following changes are planned for the next 3 months, at which time the patient will return for repeat fasting lipids:  1. Dietary changes: Reduce saturated fat, "trans" monounsaturated fatty acids, and cholesterol 2. Exercise changes:  inc exercise 3. Other treatment: Smoking cessation (pt encouraged to quit) 4. Lipid-lowering medications: None at present. Will consider if LDL > 130 on recheck.  (Recommended by NCEP after 3-6 mos of dietary therapy & lifestyle modification,  except if CHD is present or LDL well above 190.) 5. Hormone replacement therapy (patient is a postmenopausal  woman): no 6. Screening for secondary causes of dyslipidemias: None indicated 7. Lipid screening for relatives: na 8. Follow up: 3 months.  Note: The majority of the visit was spent in counseling on the pathophysiology and treatment of dyslipidemias. The total face-to-face time was in excess of 25 minutes.

## 2013-08-01 NOTE — Assessment & Plan Note (Signed)
Check labs con't meds 

## 2013-08-01 NOTE — Assessment & Plan Note (Signed)
D/w pt diet and exercise  

## 2013-08-07 ENCOUNTER — Other Ambulatory Visit: Payer: Self-pay | Admitting: *Deleted

## 2013-08-07 MED ORDER — ATORVASTATIN CALCIUM 40 MG PO TABS: 40.0000 mg | ORAL_TABLET | Freq: Every day | ORAL | Status: DC

## 2013-10-23 ENCOUNTER — Telehealth: Payer: Self-pay

## 2013-10-23 NOTE — Telephone Encounter (Signed)
Left message for call back Non identifiable  

## 2013-10-25 ENCOUNTER — Encounter: Payer: BC Managed Care – PPO | Admitting: Family Medicine

## 2013-11-04 NOTE — Telephone Encounter (Signed)
Patient cancelled appt due to work

## 2013-11-07 ENCOUNTER — Other Ambulatory Visit: Payer: Self-pay | Admitting: Family Medicine

## 2013-12-02 ENCOUNTER — Ambulatory Visit (INDEPENDENT_AMBULATORY_CARE_PROVIDER_SITE_OTHER): Payer: BC Managed Care – PPO | Admitting: Physician Assistant

## 2013-12-02 ENCOUNTER — Encounter: Payer: Self-pay | Admitting: Physician Assistant

## 2013-12-02 ENCOUNTER — Ambulatory Visit (HOSPITAL_BASED_OUTPATIENT_CLINIC_OR_DEPARTMENT_OTHER)
Admission: RE | Admit: 2013-12-02 | Discharge: 2013-12-02 | Disposition: A | Discharge status: Home / Self Care | Payer: BC Managed Care – PPO | Source: Ambulatory Visit | Attending: Physician Assistant | Admitting: Physician Assistant

## 2013-12-02 VITALS — BP 133/90 | HR 76 | Temp 98.2°F | Resp 18 | Ht 69.0 in | Wt 277.0 lb

## 2013-12-02 DIAGNOSIS — H6690 Otitis media, unspecified, unspecified ear: Secondary | ICD-10-CM

## 2013-12-02 DIAGNOSIS — R0602 Shortness of breath: Secondary | ICD-10-CM | POA: Insufficient documentation

## 2013-12-02 DIAGNOSIS — J209 Acute bronchitis, unspecified: Secondary | ICD-10-CM

## 2013-12-02 MED ORDER — AZITHROMYCIN 250 MG PO TABS: ORAL_TABLET | ORAL | Status: DC

## 2013-12-02 NOTE — Patient Instructions (Signed)
Stop Amoxicillin.  Begin taking Azithromycin as directed.  Increase fluid intake.  Rest.  Use saline nasal spray and Flonase.  Place a humidifier in the bedroom.  Mucinex for chest congestion.  Please proceed to [facility] for [facility].  I will call you with your results.  Call or return to clinic if symptoms are not improving.  Acute Bronchitis Bronchitis is inflammation of the airways that extend from the windpipe into the lungs (bronchi). The inflammation often causes mucus to develop. This leads to a cough, which is the most common symptom of bronchitis.  In acute bronchitis, the condition usually develops suddenly and goes away over time, usually in a couple weeks. Smoking, allergies, and asthma can make bronchitis worse. Repeated episodes of bronchitis may cause further lung problems.  CAUSES Acute bronchitis is most often caused by the same virus that causes a cold. The virus can spread from person to person (contagious).  SIGNS AND SYMPTOMS   Cough.   Fever.   Coughing up mucus.   Body aches.   Chest congestion.   Chills.   Shortness of breath.   Sore throat.  DIAGNOSIS  Acute bronchitis is usually diagnosed through a physical exam. Tests, such as chest X-rays, are sometimes done to rule out other conditions.  TREATMENT  Acute bronchitis usually goes away in a couple weeks. Often times, no medical treatment is necessary. Medicines are sometimes given for relief of fever or cough. Antibiotics are usually not needed but may be prescribed in certain situations. In some cases, an inhaler may be recommended to help reduce shortness of breath and control the cough. A cool mist vaporizer may also be used to help thin bronchial secretions and make it easier to clear the chest.  HOME CARE INSTRUCTIONS  Get plenty of rest.   Drink enough fluids to keep your urine clear or pale yellow (unless you have a medical condition that requires fluid restriction). Increasing fluids may  help thin your secretions and will prevent dehydration.   Only take over-the-counter or prescription medicines as directed by your health care provider.   Avoid smoking and secondhand smoke. Exposure to cigarette smoke or irritating chemicals will make bronchitis worse. If you are a smoker, consider using nicotine gum or skin patches to help control withdrawal symptoms. Quitting smoking will help your lungs heal faster.   Reduce the chances of another bout of acute bronchitis by washing your hands frequently, avoiding people with cold symptoms, and trying not to touch your hands to your mouth, nose, or eyes.   Follow up with your health care provider as directed.  SEEK MEDICAL CARE IF: Your symptoms do not improve after 1 week of treatment.  SEEK IMMEDIATE MEDICAL CARE IF:  You develop an increased fever or chills.   You have chest pain.   You have severe shortness of breath.  You have bloody sputum.   You develop dehydration.  You develop fainting.  You develop repeated vomiting.  You develop a severe headache. MAKE SURE YOU:   Understand these instructions.  Will watch your condition.  Will get help right away if you are not doing well or get worse. Document Released: 10/20/2004 Document Revised: 05/15/2013 Document Reviewed: 03/05/2013 [facility] Patient Information 2014 [place], [place].

## 2013-12-02 NOTE — Progress Notes (Signed)
Pre visit review using our clinic review tool, if applicable. No additional management support is needed unless otherwise documented below in the visit note/SLS  

## 2013-12-04 DIAGNOSIS — J40 Bronchitis, not specified as acute or chronic: Secondary | ICD-10-CM | POA: Insufficient documentation

## 2013-12-04 DIAGNOSIS — H6690 Otitis media, unspecified, unspecified ear: Secondary | ICD-10-CM | POA: Insufficient documentation

## 2013-12-04 NOTE — Progress Notes (Signed)
Patient presents to clinic today c/o head congestion, sinus pressure, sinus pain, R ear fullness and cough x 2.5 weeks.   Sinus pain has resolved mostly.  Now symptoms seem to be moved into chest. Cough is now productive of thick, green sputum. Denies fever, chills, aches. Denies SOB.  Denies recent travel or sick contact.  Past Medical History  Diagnosis Date  . HYPOTHYROIDISM 03/15/2010  . HYPERLIPIDEMIA 01/26/2010  . THROMBOCYTOPENIA 01/12/2010  . DEPRESSIVE DISORDER 04/27/2010  . CARPAL TUNNEL SYNDROME, BILATERAL 01/04/2010    Current Outpatient Prescriptions on File Prior to Visit  Medication Sig Dispense Refill  . ALPRAZolam (XANAX) 0.25 MG tablet 1 po tid prn  30 tablet  0  . atorvastatin (LIPITOR) 40 MG tablet 1 Tab by mouth at bedtime--repeat labs are due now  30 tablet  0  . escitalopram (LEXAPRO) 20 MG tablet Take 1 tablet (20 mg total) by mouth daily.  60 tablet  5  . levothyroxine (SYNTHROID, LEVOTHROID) 200 MCG tablet TAKE 1 TABLET BY MOUTH DAILY  30 tablet  11   No current facility-administered medications on file prior to visit.    No Known Allergies  Family History  Problem Relation Age of Onset  . Thyroid disease Mother     hypothyroidism  . Hypertension Mother   . Heart disease Father     cabg  . Diabetes Father   . Kidney disease Father   . Hypertension Father   . Hypertension Brother     History   Social History  . Marital Status: Married    Spouse Name: N/A    Number of Children: N/A  . Years of Education: N/A   Occupational History  .  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 Smoking status: Smoker, Current Status Unknown -- 1.50 packs/day for 18 years  . Smokeless tobacco: None     Comment: pt has tried many otc--- and wellbutrin with no success  . Alcohol Use: No  . Drug Use: No  . Sexual Activity: Yes    Partners: Female   Other Topics Concern  . None   Social History Narrative   Regular exercise-no         Review  of Systems - See HPI.  All other ROS are negative.  BP 133/90  Pulse 76  Temp(Src) 98.2 F (36.8 C) (Oral)  Resp 18  Ht 5\' 9"  (1.753 m)  Wt 277 lb (125.646 kg)  BMI 40.89 kg/m2  SpO2 96%  Physical Exam  Vitals reviewed. Constitutional: He is oriented to person, place, and time and well-developed, well-nourished, and in no distress.  HENT:  Head: Normocephalic and atraumatic.  Right Ear: External ear normal.  Left Ear: External ear normal.  Nose: Nose normal.  Mouth/Throat: Oropharynx is clear and moist. No oropharyngeal exudate.  TTP of sinuses noted. R TM within normal limits.  L TM is dull and erythematous.  No effusion noted.  Eyes: Conjunctivae are normal. Pupils are equal, round, and reactive to light.  Neck: Neck supple.  Cardiovascular: Normal rate, regular rhythm, normal heart sounds and intact distal pulses.   Pulmonary/Chest: Effort normal. No respiratory distress. He has no wheezes. He has rales. He exhibits no tenderness.  Lymphadenopathy:    He has no cervical adenopathy.  Neurological: He is alert and oriented to person, place, and time.  Skin: Skin is warm and dry. No rash noted.  Psychiatric: Affect normal.    No results found  for this or any previous visit (from the past 2160 hour(s)).  Assessment/Plan: Acute bronchitis Rx Azithromycin.  Will obtain CXR due to faint crackles auscultated on exam.  Increase fluid intake.  Rest. Saline nasal spray.  Mucinex. Probiotic.  Humidifier in bedroom.  Call or return to clinic if symptoms not improving.  Otitis media Rx azithromycin giving concomitant bronchitis.

## 2013-12-04 NOTE — Assessment & Plan Note (Signed)
Rx Azithromycin.  Will obtain CXR due to faint crackles auscultated on exam.  Increase fluid intake.  Rest. Saline nasal spray.  Mucinex. Probiotic.  Humidifier in bedroom.  Call or return to clinic if symptoms not improving.

## 2013-12-04 NOTE — Assessment & Plan Note (Signed)
Rx azithromycin giving concomitant bronchitis.

## 2013-12-06 ENCOUNTER — Encounter: Payer: Self-pay | Admitting: Family Medicine

## 2013-12-06 ENCOUNTER — Ambulatory Visit (INDEPENDENT_AMBULATORY_CARE_PROVIDER_SITE_OTHER): Payer: BC Managed Care – PPO | Admitting: Family Medicine

## 2013-12-06 ENCOUNTER — Telehealth: Payer: Self-pay | Admitting: *Deleted

## 2013-12-06 VITALS — BP 122/80 | HR 83 | Temp 98.4°F | Wt 276.0 lb

## 2013-12-06 DIAGNOSIS — J209 Acute bronchitis, unspecified: Secondary | ICD-10-CM

## 2013-12-06 DIAGNOSIS — F17200 Nicotine dependence, unspecified, uncomplicated: Secondary | ICD-10-CM

## 2013-12-06 MED ORDER — IPRATROPIUM-ALBUTEROL 0.5-2.5 (3) MG/3ML IN SOLN: 3.0000 mL | Freq: Once | RESPIRATORY_TRACT | Status: DC

## 2013-12-06 MED ORDER — IPRATROPIUM-ALBUTEROL 0.5-2.5 (3) MG/3ML IN SOLN
3.0000 mL | Freq: Once | RESPIRATORY_TRACT | Status: AC
  Administered 2013-12-06: 3 mL via RESPIRATORY_TRACT

## 2013-12-06 MED ORDER — FLUTICASONE PROPIONATE 50 MCG/ACT NA SUSP: 2.0000 | Freq: Every day | NASAL | Status: DC

## 2013-12-06 MED ORDER — PREDNISONE 10 MG PO TABS: ORAL_TABLET | ORAL | Status: DC

## 2013-12-06 MED ORDER — MOXIFLOXACIN HCL 400 MG PO TABS: 400.0000 mg | ORAL_TABLET | Freq: Every day | ORAL | Status: DC

## 2013-12-06 MED ORDER — LORATADINE 10 MG PO TABS: 10.0000 mg | ORAL_TABLET | Freq: Every day | ORAL | Status: DC

## 2013-12-06 MED ORDER — ALBUTEROL SULFATE HFA 108 (90 BASE) MCG/ACT IN AERS: 2.0000 | INHALATION_SPRAY | Freq: Four times a day (QID) | RESPIRATORY_TRACT | Status: DC | PRN

## 2013-12-06 MED ORDER — METHYLPREDNISOLONE ACETATE 80 MG/ML IJ SUSP
80.0000 mg | Freq: Once | INTRAMUSCULAR | Status: DC
Start: 2013-12-06 — End: 2013-12-06

## 2013-12-06 MED ORDER — METHYLPREDNISOLONE ACETATE 80 MG/ML IJ SUSP
80.0000 mg | Freq: Once | INTRAMUSCULAR | Status: AC
  Administered 2013-12-06: 80 mg via INTRAMUSCULAR

## 2013-12-06 NOTE — Telephone Encounter (Signed)
Called and unable to leave message, stated that mailbox was full. JG//CMA

## 2013-12-06 NOTE — Progress Notes (Signed)
Pre visit review using our clinic review tool, if applicable. No additional management support is needed unless otherwise documented below in the visit note. 

## 2013-12-06 NOTE — Telephone Encounter (Signed)
Patient's wife called and stated that patient is not feeling better since beginning Z-pak and amoxicillin on 12/02/2013. Please advise. JG//CMA

## 2013-12-06 NOTE — Patient Instructions (Addendum)
Bronchitis Bronchitis is inflammation of the airways that extend from the windpipe into the lungs (bronchi). The inflammation often causes mucus to develop, which leads to a cough. If the inflammation becomes severe, it may cause shortness of breath. CAUSES  Bronchitis may be caused by:   Viral infections.   Bacteria.   Cigarette smoke.   Allergens, pollutants, and other irritants.  SIGNS AND SYMPTOMS  The most common symptom of bronchitis is a frequent cough that produces mucus. Other symptoms include:  Fever.   Body aches.   Chest congestion.   Chills.   Shortness of breath.   Sore throat.  DIAGNOSIS  Bronchitis is usually diagnosed through a medical history and physical exam. Tests, such as chest X-rays, are sometimes done to rule out other conditions.  TREATMENT  You may need to avoid contact with whatever caused the problem (smoking, for example). Medicines are sometimes needed. These may include:  Antibiotics. These may be prescribed if the condition is caused by bacteria.  Cough suppressants. These may be prescribed for relief of cough symptoms.   Inhaled medicines. These may be prescribed to help open your airways and make it easier for you to breathe.   Steroid medicines. These may be prescribed for those with recurrent (chronic) bronchitis. HOME CARE INSTRUCTIONS  Get plenty of rest.   Drink enough fluids to keep your urine clear or pale yellow (unless you have a medical condition that requires fluid restriction). Increasing fluids may help thin your secretions and will prevent dehydration.   Only take over-the-counter or prescription medicines as directed by your health care provider.  Only take antibiotics as directed. Make sure you finish them even if you start to feel better.  Avoid secondhand smoke, irritating chemicals, and strong fumes. These will make bronchitis worse. If you are a smoker, quit smoking. Consider using nicotine gum or  skin patches to help control withdrawal symptoms. Quitting smoking will help your lungs heal faster.   Put a cool-mist humidifier in your bedroom at night to moisten the air. This may help loosen mucus. Change the water in the humidifier daily. You can also run the hot water in your shower and sit in the bathroom with the door closed for 5 10 minutes.   Follow up with your health care provider as directed.   Wash your hands frequently to avoid catching bronchitis again or spreading an infection to others.  SEEK MEDICAL CARE IF: Your symptoms do not improve after 1 week of treatment.  SEEK IMMEDIATE MEDICAL CARE IF:  Your fever increases.  You have chills.   You have chest pain.   You have worsening shortness of breath.   You have bloody sputum.  You faint.  You have lightheadedness.  You have a severe headache.   You vomit repeatedly. MAKE SURE YOU:   Understand these instructions.  Will watch your condition.  Will get help right away if you are not doing well or get worse. Document Released: 09/12/2005 Document Revised: 07/03/2013 Document Reviewed: 05/07/2013 [employer] Patient Information 2014 [employer]. Smoking Cessation Quitting smoking is important to your health and has many advantages. However, it is not always easy to quit since nicotine is a very addictive drug. Often times, people try 3 times or more before being able to quit. This document explains the best ways for you to prepare to quit smoking. Quitting takes hard work and a lot of effort, but you can do it. ADVANTAGES OF QUITTING SMOKING  You will live longer,   feel better, and live better.  Your body will feel the impact of quitting smoking almost immediately.  Within 20 minutes, blood pressure decreases. Your pulse returns to its normal level.  After 8 hours, carbon monoxide levels in the blood return to normal. Your oxygen level increases.  After 24 hours, the chance of having a heart  attack starts to decrease. Your breath, hair, and body stop smelling like smoke.  After 48 hours, damaged nerve endings begin to recover. Your sense of taste and smell improve.  After 72 hours, the body is virtually free of nicotine. Your bronchial tubes relax and breathing becomes easier.  After 2 to 12 weeks, lungs can hold more air. Exercise becomes easier and circulation improves.  The risk of having a heart attack, stroke, cancer, or lung disease is greatly reduced.  After 1 year, the risk of coronary heart disease is cut in half.  After 5 years, the risk of stroke falls to the same as a nonsmoker.  After 10 years, the risk of lung cancer is cut in half and the risk of other cancers decreases significantly.  After 15 years, the risk of coronary heart disease drops, usually to the level of a nonsmoker.  If you are pregnant, quitting smoking will improve your chances of having a healthy baby.  The people you live with, especially any children, will be healthier.  You will have extra money to spend on things other than cigarettes. QUESTIONS TO THINK ABOUT BEFORE ATTEMPTING TO QUIT You may want to talk about your answers with your caregiver.  Why do you want to quit?  If you tried to quit in the past, what helped and what did not?  What will be the most difficult situations for you after you quit? How will you plan to handle them?  Who can help you through the tough times? Your family? Friends? A caregiver?  What pleasures do you get from smoking? What ways can you still get pleasure if you quit? Here are some questions to ask your caregiver:  How can you help me to be successful at quitting?  What medicine do you think would be best for me and how should I take it?  What should I do if I need more help?  What is smoking withdrawal like? How can I get information on withdrawal? GET READY  Set a quit date.  Change your environment by getting rid of all cigarettes,  ashtrays, matches, and lighters in your home, car, or work. Do not let people smoke in your home.  Review your past attempts to quit. Think about what worked and what did not. GET SUPPORT AND ENCOURAGEMENT You have a better chance of being successful if you have help. You can get support in many ways.  Tell your family, friends, and co-workers that you are going to quit and need their support. Ask them not to smoke around you.  Get individual, group, or telephone counseling and support. Programs are available at local hospitals and health centers. Call your local health department for information about programs in your area.  Spiritual beliefs and practices may help some smokers quit.  Download a "quit meter" on your computer to keep track of quit statistics, such as how long you have gone without smoking, cigarettes not smoked, and money saved.  Get a self-help book about quitting smoking and staying off of tobacco. LEARN NEW SKILLS AND BEHAVIORS  Distract yourself from urges to smoke. Talk to someone, go for a   walk, or occupy your time with a task.  Change your normal routine. Take a different route to work. Drink tea instead of coffee. Eat breakfast in a different place.  Reduce your stress. Take a hot bath, exercise, or read a book.  Plan something enjoyable to do every day. Reward yourself for not smoking.  Explore interactive web-based programs that specialize in helping you quit. GET MEDICINE AND USE IT CORRECTLY Medicines can help you stop smoking and decrease the urge to smoke. Combining medicine with the above behavioral methods and support can greatly increase your chances of successfully quitting smoking.  Nicotine replacement therapy helps deliver nicotine to your body without the negative effects and risks of smoking. Nicotine replacement therapy includes nicotine gum, lozenges, inhalers, nasal sprays, and skin patches. Some may be available over-the-counter and others  require a prescription.  Antidepressant medicine helps people abstain from smoking, but how this works is unknown. This medicine is available by prescription.  Nicotinic receptor partial agonist medicine simulates the effect of nicotine in your brain. This medicine is available by prescription. Ask your caregiver for advice about which medicines to use and how to use them based on your health history. Your caregiver will tell you what side effects to look out for if you choose to be on a medicine or therapy. Carefully read the information on the package. Do not use any other product containing nicotine while using a nicotine replacement product.  RELAPSE OR DIFFICULT SITUATIONS Most relapses occur within the first 3 months after quitting. Do not be discouraged if you start smoking again. Remember, most people try several times before finally quitting. You may have symptoms of withdrawal because your body is used to nicotine. You may crave cigarettes, be irritable, feel very hungry, cough often, get headaches, or have difficulty concentrating. The withdrawal symptoms are only temporary. They are strongest when you first quit, but they will go away within 10 14 days. To reduce the chances of relapse, try to:  Avoid drinking alcohol. Drinking lowers your chances of successfully quitting.  Reduce the amount of caffeine you consume. Once you quit smoking, the amount of caffeine in your body increases and can give you symptoms, such as a rapid heartbeat, sweating, and anxiety.  Avoid smokers because they can make you want to smoke.  Do not let weight gain distract you. Many smokers will gain weight when they quit, usually less than 10 pounds. Eat a healthy diet and stay active. You can always lose the weight gained after you quit.  Find ways to improve your mood other than smoking. FOR MORE INFORMATION  www.smokefree.gov  Document Released: 09/06/2001 Document Revised: 03/13/2012 Document Reviewed:  12/22/2011 Exit[employer] Patient Information 2014 [employer].  

## 2013-12-06 NOTE — Progress Notes (Signed)
  Subjective:     Daniel Acevedo is a 38 y.o. male here for evaluation of a cough. Onset of symptoms was several weeks ago. Symptoms have been gradually worsening since that time. The cough is productive and is aggravated by infection and reclining position. Associated symptoms include: postnasal drip, shortness of breath, sputum production and wheezing. Patient does not have a history of asthma. Patient does not have a history of environmental allergens. Patient has not traveled recently. Patient does have a history of smoking. Patient has had a previous chest x-ray. Patient has not had a PPD done.  The following portions of the patient's history were reviewed and updated as appropriate: allergies, current medications, past family history, past medical history, past social history, past surgical history and problem list.  Review of Systems Pertinent items are noted in HPI.    Objective:    Oxygen saturation 96% on room air BP 122/80  Pulse 83  Temp(Src) 98.4 F (36.9 C) (Oral)  Wt 276 lb (125.193 kg)  SpO2 96% General appearance: alert, cooperative, appears stated age and no distress Ears: normal TM's and external ear canals both ears Nose: Nares normal. Septum midline. Mucosa normal. No drainage or sinus tenderness. Throat: lips, mucosa, and tongue normal; teeth and gums normal Neck: no adenopathy, supple, symmetrical, trachea midline and thyroid not enlarged, symmetric, no tenderness/mass/nodules Lungs: rhonchi bilaterally and wheezes bilaterally Heart: S1, S2 normal    Assessment:    Acute Bronchitis    Plan:    Antibiotics per medication orders. Avoid exposure to tobacco smoke and fumes. B-agonist inhaler. Call if shortness of breath worsens, blood in sputum, change in character of cough, development of fever or chills, inability to maintain nutrition and hydration. Avoid exposure to tobacco smoke and fumes. Pulmonary consult.

## 2013-12-06 NOTE — Telephone Encounter (Signed)
I did not prescribe Amoxicillin.  It was discontinued and I started him on a Z-pack.  He has only been on the medication for 2 days max.  I would encourage him to take the full course of antibiotic.  If not improving, he should return to clinic to see his PCP for further evaluation.

## 2013-12-09 ENCOUNTER — Telehealth: Payer: Self-pay | Admitting: Family Medicine

## 2013-12-09 NOTE — Telephone Encounter (Signed)
Relevant patient education assigned to patient using Emmi. ° °

## 2013-12-21 ENCOUNTER — Other Ambulatory Visit: Payer: Self-pay | Admitting: Family Medicine

## 2014-01-13 ENCOUNTER — Other Ambulatory Visit: Payer: Self-pay | Admitting: Family Medicine

## 2014-01-14 MED ORDER — ATORVASTATIN CALCIUM 40 MG PO TABS: ORAL_TABLET | ORAL | Status: DC

## 2014-02-10 ENCOUNTER — Other Ambulatory Visit: Payer: Self-pay | Admitting: Family Medicine

## 2014-03-11 ENCOUNTER — Other Ambulatory Visit: Payer: Self-pay | Admitting: Family Medicine

## 2014-07-10 ENCOUNTER — Other Ambulatory Visit: Payer: Self-pay

## 2014-07-16 ENCOUNTER — Other Ambulatory Visit: Payer: Self-pay

## 2014-07-16 MED ORDER — ATORVASTATIN CALCIUM 40 MG PO TABS: ORAL_TABLET | ORAL | Status: DC

## 2014-10-06 ENCOUNTER — Other Ambulatory Visit: Payer: Self-pay

## 2014-10-06 ENCOUNTER — Telehealth: Payer: Self-pay | Admitting: Family Medicine

## 2014-10-06 DIAGNOSIS — E785 Hyperlipidemia, unspecified: Secondary | ICD-10-CM

## 2014-10-06 DIAGNOSIS — E039 Hypothyroidism, unspecified: Secondary | ICD-10-CM

## 2014-10-06 DIAGNOSIS — F411 Generalized anxiety disorder: Secondary | ICD-10-CM

## 2014-10-06 MED ORDER — ESCITALOPRAM OXALATE 20 MG PO TABS: 20.0000 mg | ORAL_TABLET | Freq: Every day | ORAL | Status: DC

## 2014-10-06 MED ORDER — ATORVASTATIN CALCIUM 40 MG PO TABS: ORAL_TABLET | ORAL | Status: DC

## 2014-10-06 NOTE — Telephone Encounter (Signed)
Pt is requesting lab orders for cholesterol pt states he can not refill medication.

## 2014-10-06 NOTE — Telephone Encounter (Signed)
Orders are in. Please schedule a follow up wit Fasting labs. He needs to see the doctor.       KP

## 2014-10-06 NOTE — Telephone Encounter (Signed)
Appointment scheduled.

## 2014-10-06 NOTE — Telephone Encounter (Signed)
Lvm awaiting pt to call back

## 2014-10-07 ENCOUNTER — Other Ambulatory Visit: Payer: Self-pay

## 2014-10-10 ENCOUNTER — Ambulatory Visit (INDEPENDENT_AMBULATORY_CARE_PROVIDER_SITE_OTHER): Payer: BLUE CROSS/BLUE SHIELD | Admitting: Family Medicine

## 2014-10-10 ENCOUNTER — Encounter: Payer: Self-pay | Admitting: Family Medicine

## 2014-10-10 VITALS — BP 126/80 | HR 76 | Temp 98.1°F | Wt 285.0 lb

## 2014-10-10 DIAGNOSIS — Z720 Tobacco use: Secondary | ICD-10-CM

## 2014-10-10 DIAGNOSIS — E039 Hypothyroidism, unspecified: Secondary | ICD-10-CM

## 2014-10-10 DIAGNOSIS — F411 Generalized anxiety disorder: Secondary | ICD-10-CM

## 2014-10-10 DIAGNOSIS — E785 Hyperlipidemia, unspecified: Secondary | ICD-10-CM

## 2014-10-10 LAB — LIPID PANEL
Cholesterol: 128 mg/dL (ref 0–200)
HDL: 28 mg/dL — AB (ref >39.00)
LDL Cholesterol: 79 mg/dL (ref 0–99)
NonHDL: 100
Total CHOL/HDL Ratio: 5
Triglycerides: 104 mg/dL (ref 0.0–149.0)
VLDL: 20.8 mg/dL (ref 0.0–40.0)

## 2014-10-10 LAB — HEPATIC FUNCTION PANEL
ALT: 24 U/L (ref 0–53)
AST: 22 U/L (ref 0–37)
Albumin: 4.4 g/dL (ref 3.5–5.2)
Alkaline Phosphatase: 116 U/L (ref 39–117)
BILIRUBIN TOTAL: 0.8 mg/dL (ref 0.2–1.2)
Bilirubin, Direct: 0.2 mg/dL (ref 0.0–0.3)
Total Protein: 7.2 g/dL (ref 6.0–8.3)

## 2014-10-10 LAB — BASIC METABOLIC PANEL
BUN: 18 mg/dL (ref 6–23)
CALCIUM: 9.8 mg/dL (ref 8.4–10.5)
CO2: 27 mEq/L (ref 19–32)
CREATININE: 0.95 mg/dL (ref 0.40–1.50)
Chloride: 109 mEq/L (ref 96–112)
GFR: 95.24 mL/min (ref >60.00)
Glucose, Bld: 103 mg/dL — ABNORMAL HIGH (ref 70–99)
Potassium: 4.5 mEq/L (ref 3.5–5.1)
Sodium: 141 mEq/L (ref 135–145)

## 2014-10-10 LAB — T3, FREE: T3, Free: 3.8 pg/mL (ref 2.3–4.2)

## 2014-10-10 LAB — POCT URINALYSIS DIPSTICK
Bilirubin, UA: NEGATIVE
Blood, UA: NEGATIVE
Glucose, UA: NEGATIVE
Ketones, UA: NEGATIVE
Leukocytes, UA: NEGATIVE
Nitrite, UA: NEGATIVE
PH UA: 5.5
PROTEIN UA: NEGATIVE
Spec Grav, UA: 1.025
Urobilinogen, UA: NEGATIVE

## 2014-10-10 LAB — MICROALBUMIN / CREATININE URINE RATIO
CREATININE, U: 210.3 mg/dL
Microalb Creat Ratio: 0.7 mg/g (ref 0.0–30.0)
Microalb, Ur: 1.4 mg/dL (ref 0.0–1.9)

## 2014-10-10 LAB — TSH: TSH: 0.88 u[IU]/mL (ref 0.35–4.50)

## 2014-10-10 LAB — T4, FREE: FREE T4: 1.36 ng/dL (ref 0.60–1.60)

## 2014-10-10 MED ORDER — ESCITALOPRAM OXALATE 20 MG PO TABS: 20.0000 mg | ORAL_TABLET | Freq: Every day | ORAL | Status: DC

## 2014-10-10 NOTE — Progress Notes (Signed)
Pre visit review using our clinic review tool, if applicable. No additional management support is needed unless otherwise documented below in the visit note. 

## 2014-10-10 NOTE — Progress Notes (Signed)
    Subjective:     HPI: Daniel Acevedo is a 39 y.o. male here for follow up of dyslipidemia. The patient does not use medications that may worsen dyslipidemias (corticosteroids, progestins, anabolic steroids, diuretics, beta-blockers, amiodarone, cyclosporine, olanzapine). The patient exercises never. The patient is not known to have coexisting coronary artery disease.    Review of Systems: Review of Systems - General ROS: negative Respiratory ROS: no cough, shortness of breath, or wheezing Cardiovascular ROS: no chest pain or dyspnea on exertion      Objective:    Filed Vitals:   10/10/14 0921  BP: 126/80  Pulse: 76  Temp: 98.1 F (36.7 C)    Physical Exam: General Appearance:  awake, alert, oriented, in no acute distress and in no acute distress Neck:  neck- supple, no mass, non-tender Lungs:  Normal expansion.  Clear to auscultation.  No rales, rhonchi, or wheezing. Heart:  Heart sounds are normal.  Regular rate and rhythm without murmur, gallop or rub. Extremities: Extremities warm to touch, pink, with no edema.   Lab Review Lab Results  Component Value Date   CHOL 201* 08/01/2013   CHOL 175 04/25/2012   CHOL 208* 11/10/2011   HDL 37.40* 08/01/2013   HDL 38.50* 04/25/2012   HDL 35.80* 11/10/2011   LDLDIRECT 141.4 08/01/2013   LDLDIRECT 140.4 11/10/2011       Assessment:     Problem List Items Addressed This Visit    None    Visit Diagnoses    Hypothyroidism, unspecified hypothyroidism type    -  Primary    Relevant Orders    TSH    POCT urinalysis dipstick    T3, free    T4, free    Hyperlipidemia        Relevant Orders    Basic metabolic panel    Hepatic function panel    Lipid panel    TSH    Microalbumin / creatinine urine ratio    Generalized anxiety disorder        Relevant Medications    escitalopram (LEXAPRO) tablet    Tobacco abuse               Plan:    The following changes are planned for the next 6 months, at which time  the patient will return for repeat fasting lipids:  1. Dietary recommendations: Reduce saturated fat, "trans" monounsaturated fatty acids, and cholesterol 2. Exercise recommendations:  An average 40 minutes of moderate to vigorous-intensity aerobic activity 3 or 4 times per week 3. Other treatment: Smoking cessation (handout given)  Return in about 6 months (around 04/10/2015) for f/u and labs.    1. Hypothyroidism, unspecified hypothyroidism type Check labs, con't synthroid - TSH - POCT urinalysis dipstick - T3, free - T4, free  2. Hyperlipidemia Check labs - Basic metabolic panel - Hepatic function panel - Lipid panel - TSH - Microalbumin / creatinine urine ratio  3. Generalized anxiety disorder stable - escitalopram (LEXAPRO) 20 MG tablet; Take 1 tablet (20 mg total) by mouth daily.  Dispense: 90 tablet; Refill: 3  4. Tobacco abuse Hand out given

## 2014-10-10 NOTE — Patient Instructions (Signed)
Cholesterol Cholesterol is a white, waxy, fat-like substance needed by your body in small amounts. The liver makes all the cholesterol you need. Cholesterol is carried from the liver by the blood through the blood vessels. Deposits of cholesterol (plaque) may build up on blood vessel walls. These make the arteries narrower and stiffer. Cholesterol plaques increase the risk for heart attack and stroke.  You cannot feel your cholesterol level even if it is very high. The only way to know it is high is with a blood test. Once you know your cholesterol levels, you should keep a record of the test results. Work with your health care provider to keep your levels in the desired range.  WHAT DO THE RESULTS MEAN?  Total cholesterol is a rough measure of all the cholesterol in your blood.   LDL is the so-called bad cholesterol. This is the type that deposits cholesterol in the walls of the arteries. You want this level to be low.   HDL is the good cholesterol because it cleans the arteries and carries the LDL away. You want this level to be high.  Triglycerides are fat that the body can either burn for energy or store. High levels are closely linked to heart disease.  WHAT ARE THE DESIRED LEVELS OF CHOLESTEROL?  Total cholesterol below 200.   LDL below 100 for people at risk, below 70 for those at very high risk.   HDL above 50 is good, above 60 is best.   Triglycerides below 150.  HOW CAN I LOWER MY CHOLESTEROL?  Diet. Follow your diet programs as directed by your health care provider.   Choose fish or white meat chicken and [place], roasted or baked. Limit fatty cuts of red meat, fried foods, and processed meats, such as sausage and lunch meats.   Eat lots of fresh fruits and vegetables.  Choose whole grains, beans, pasta, potatoes, and cereals.   Use only small amounts of olive, corn, or canola oils.   Avoid butter, mayonnaise, shortening, or palm kernel oils.  Avoid foods with  trans fats.   Drink skim or nonfat milk and eat low-fat or nonfat yogurt and cheeses. Avoid whole milk, cream, ice cream, egg yolks, and full-fat cheeses.   Healthy desserts include angel food cake, ginger snaps, animal crackers, hard candy, popsicles, and low-fat or nonfat frozen yogurt. Avoid pastries, cakes, pies, and cookies.   Exercise. Follow your exercise programs as directed by your health care provider.   A regular program helps decrease LDL and raise HDL.   A regular program helps with weight control.   Do things that increase your activity level like gardening, walking, or taking the stairs. Ask your health care provider about how you can be more active in your daily life.   Medicine. Take medicine only as directed by your health care provider.   Medicine may be prescribed by your health care provider to help lower cholesterol and decrease the risk for heart disease.   If you have several risk factors, you may need medicine even if your levels are normal. Document Released: 06/07/2001 Document Revised: 01/27/2014 Document Reviewed: 06/26/2013 [facility] Patient Information 2015 [place], [place]. This information is not intended to replace advice given to you by your health care provider. Make sure you discuss any questions you have with your health care provider.  Smoking Cessation, Tips for Success If you are ready to quit smoking, congratulations! You have chosen to help yourself be healthier. Cigarettes bring nicotine, tar, carbon monoxide,  and other irritants into your body. Your lungs, heart, and blood vessels will be able to work better without these poisons. There are many different ways to quit smoking. Nicotine gum, nicotine patches, a nicotine inhaler, or nicotine nasal spray can help with physical craving. Hypnosis, support groups, and medicines help break the habit of smoking. WHAT THINGS CAN I DO TO MAKE QUITTING EASIER?  Here are some tips to help you quit for  good:  Pick a date when you will quit smoking completely. Tell all of your friends and family about your plan to quit on that date.  Do not try to slowly cut down on the number of cigarettes you are smoking. Pick a quit date and quit smoking completely starting on that day.  Throw away all cigarettes.   Clean and remove all ashtrays from your home, work, and car.  On a card, write down your reasons for quitting. Carry the card with you and read it when you get the urge to smoke.  Cleanse your body of nicotine. Drink enough water and fluids to keep your urine clear or pale yellow. Do this after quitting to flush the nicotine from your body.  Learn to predict your moods. Do not let a bad situation be your excuse to have a cigarette. Some situations in your life might tempt you into wanting a cigarette.  Never have "just one" cigarette. It leads to wanting another and another. Remind yourself of your decision to quit.  Change habits associated with smoking. If you smoked while driving or when feeling stressed, try other activities to replace smoking. Stand up when drinking your coffee. Brush your teeth after eating. Sit in a different chair when you read the paper. Avoid alcohol while trying to quit, and try to drink fewer caffeinated beverages. Alcohol and caffeine may urge you to smoke.  Avoid foods and drinks that can trigger a desire to smoke, such as sugary or spicy foods and alcohol.  Ask people who smoke not to smoke around you.  Have something planned to do right after eating or having a cup of coffee. For example, plan to take a walk or exercise.  Try a relaxation exercise to calm you down and decrease your stress. Remember, you may be tense and nervous for the first 2 weeks after you quit, but this will pass.  Find new activities to keep your hands busy. Play with a pen, coin, or rubber band. Doodle or draw things on paper.  Brush your teeth right after eating. This will help  cut down on the craving for the taste of tobacco after meals. You can also try mouthwash.   Use oral substitutes in place of cigarettes. Try using lemon drops, carrots, cinnamon sticks, or chewing gum. Keep them handy so they are available when you have the urge to smoke.  When you have the urge to smoke, try deep breathing.  Designate your home as a nonsmoking area.  If you are a heavy smoker, ask your health care provider about a prescription for nicotine chewing gum. It can ease your withdrawal from nicotine.  Reward yourself. Set aside the cigarette money you save and buy yourself something nice.  Look for support from others. Join a support group or smoking cessation program. Ask someone at home or at work to help you with your plan to quit smoking.  Always ask yourself, "Do I need this cigarette or is this just a reflex?" Tell yourself, "Today, I choose not to smoke,"  or "I do not want to smoke." You are reminding yourself of your decision to quit.  Do not replace cigarette smoking with electronic cigarettes (commonly called e-cigarettes). The safety of e-cigarettes is unknown, and some may contain harmful chemicals.  If you relapse, do not give up! Plan ahead and think about what you will do the next time you get the urge to smoke. HOW WILL I FEEL WHEN I QUIT SMOKING? You may have symptoms of withdrawal because your body is used to nicotine (the addictive substance in cigarettes). You may crave cigarettes, be irritable, feel very hungry, cough often, get headaches, or have difficulty concentrating. The withdrawal symptoms are only temporary. They are strongest when you first quit but will go away within 10-14 days. When withdrawal symptoms occur, stay in control. Think about your reasons for quitting. Remind yourself that these are signs that your body is healing and getting used to being without cigarettes. Remember that withdrawal symptoms are easier to treat than the major diseases  that smoking can cause.  Even after the withdrawal is over, expect periodic urges to smoke. However, these cravings are generally short lived and will go away whether you smoke or not. Do not smoke! WHAT RESOURCES ARE AVAILABLE TO HELP ME QUIT SMOKING? Your health care provider can direct you to community resources or hospitals for support, which may include:  Group support.  Education.  Hypnosis.  Therapy. Document Released: 06/10/2004 Document Revised: 01/27/2014 Document Reviewed: 02/28/2013 [facility] Patient Information 2015 [place]. This information is not intended to replace advice given to you by your health care provider. Make sure you discuss any questions you have with your health care provider.

## 2014-11-02 ENCOUNTER — Other Ambulatory Visit: Payer: Self-pay | Admitting: Family Medicine

## 2015-05-23 ENCOUNTER — Other Ambulatory Visit: Payer: Self-pay | Admitting: Family Medicine

## 2015-07-04 ENCOUNTER — Other Ambulatory Visit: Payer: Self-pay | Admitting: Family Medicine

## 2015-07-29 ENCOUNTER — Other Ambulatory Visit: Payer: Self-pay | Admitting: Family Medicine

## 2015-08-30 ENCOUNTER — Other Ambulatory Visit: Payer: Self-pay | Admitting: Family Medicine

## 2015-08-31 ENCOUNTER — Encounter: Payer: Self-pay | Admitting: Family Medicine

## 2015-08-31 ENCOUNTER — Ambulatory Visit (INDEPENDENT_AMBULATORY_CARE_PROVIDER_SITE_OTHER): Payer: BLUE CROSS/BLUE SHIELD | Admitting: Family Medicine

## 2015-08-31 VITALS — BP 132/92 | HR 75 | Temp 98.0°F | Resp 18 | Ht 69.0 in | Wt 286.8 lb

## 2015-08-31 DIAGNOSIS — R05 Cough: Secondary | ICD-10-CM | POA: Diagnosis not present

## 2015-08-31 DIAGNOSIS — J209 Acute bronchitis, unspecified: Secondary | ICD-10-CM | POA: Diagnosis not present

## 2015-08-31 DIAGNOSIS — R059 Cough, unspecified: Secondary | ICD-10-CM

## 2015-08-31 DIAGNOSIS — R030 Elevated blood-pressure reading, without diagnosis of hypertension: Secondary | ICD-10-CM

## 2015-08-31 MED ORDER — AZITHROMYCIN 250 MG PO TABS: ORAL_TABLET | ORAL | Status: DC

## 2015-08-31 NOTE — Patient Instructions (Addendum)
Please complete antibiotic. If symptoms persist or temperature is >101 please contact office. Please monitor BP at home one time/day. Keep a journal and document Blood pressures for follow up with Dr. Laury AxonLowne in one month for BP check. Can use mucinex for symptom relief. Delsym for cough as needed

## 2015-08-31 NOTE — Telephone Encounter (Signed)
Medication filled to pharmacy as requested.   

## 2015-08-31 NOTE — Progress Notes (Signed)
Pre visit review using our clinic review tool, if applicable. No additional management support is needed unless otherwise documented below in the visit note. 

## 2015-08-31 NOTE — Progress Notes (Addendum)
Subjective:    Patient ID: Daniel Acevedo, male    DOB: 01-21-1976, 39 y.o.   MRN: [id number]  HPI   Patient has experienced productive cough with clear sputum for 5 days. Associated symptoms of frontal sinus pressure and pain with nasal congestion and fatigue. No fever or chills noted. Patient has taken Advil sinus, mucinex, and Tylenol cold over the counter medication without relief.  No recent antibiotic use, sick contact exposure (son) with bronchitis and otitis media. Smokes 2 ppd currently No history of asthma or bronchitis.  Blood pressure elevated today. Patient states that he does not take BP medicine and does not monitor at home. Patient does not take medications for BP.   Review of Systems  Constitutional: Positive for fatigue. Negative for chills.  HENT: Positive for congestion and sinus pressure (Frontal sinus pressue without pain). Ear pain: R ear pressure without pain.        +nasal congestion  Eyes: Negative.   Respiratory: Positive for cough (productive with clear sputum).   Cardiovascular: Negative.   Skin: Negative.    Past Medical History  Diagnosis Date  . HYPOTHYROIDISM 03/15/2010  . HYPERLIPIDEMIA 01/26/2010  . THROMBOCYTOPENIA 01/12/2010  . DEPRESSIVE DISORDER 04/27/2010  . CARPAL TUNNEL SYNDROME, BILATERAL 01/04/2010    Social History   Social History  . Marital Status: Married    Spouse Name: N/A  . Number of Children: N/A  . Years of Education: N/A   Occupational History  .  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 Smoking status: Smoker, Current Status Unknown -- 1.50 packs/day for 18 years  . Smokeless tobacco: Not on file     Comment: pt has tried many otc--- and wellbutrin with no success  . Alcohol Use: No  . Drug Use: No  . Sexual Activity:    Partners: Female   Other Topics Concern  . Not on file   Social History Narrative   Regular exercise-no          Past Surgical History  Procedure Laterality Date  .  Bone marrow biopsy      Family History  Problem Relation Age of Onset  . Thyroid disease Mother     hypothyroidism  . Hypertension Mother   . Heart disease Father     cabg  . Diabetes Father   . Kidney disease Father   . Hypertension Father   . Hypertension Brother     No Known Allergies  Current Outpatient Prescriptions on File Prior to Visit  Medication Sig Dispense Refill  . atorvastatin (LIPITOR) 40 MG tablet TAKE 1 TABLET (40 MG TOTAL) BY MOUTH AT BEDTIME. REPEAT LABS ARE DUE NOW 30 tablet 0  . escitalopram (LEXAPRO) 20 MG tablet TAKE 1 TABLET BY MOUTH EVERY DAY 30 tablet 0  . levothyroxine (SYNTHROID, LEVOTHROID) 200 MCG tablet TAKE 1 TABLET BY MOUTH EVERY DAY 30 tablet 5   No current facility-administered medications on file prior to visit.    BP 132/92 mmHg  Pulse 75  Temp(Src) 98 F (36.7 C) (Oral)  Resp 18  Ht '5\' 9"'  (1.753 m)  Wt 286 lb 12.8 oz (130.092 kg)  BMI 42.33 kg/m2  SpO2 99%        Objective:   Physical Exam  Constitutional: He is oriented to person, place, and time. He appears well-developed and well-nourished.  HENT:  +Frontal sinus pressure. No tenderness upon palpation. R ear pressure without evidence of otitis media. TM  light reflex present without signs of infection  Cardiovascular: Normal rate, regular rhythm and normal heart sounds.   Pulmonary/Chest:  Rales auscultated in left lower lobe which cleared upon coughing  Neurological: He is alert and oriented to person, place, and time.  Skin: Skin is warm and dry.   Medications, allergies, family and social history reviewed.         Assessment & Plan:  #1 acute bronchitis w/o bronchospasm. Complete azithromycin as prescribed. Mucinex and Delsym can be used for symptom relief. Avoid over the counter cold medications that can increase blood pressure. #2 Elevated BP without diagnosis of hypertension. Instructed patient to monitor BP at home daily and keep a journal. Follow up with Dr.  Etter Sjogren in one month for BP evaluation.

## 2015-10-09 ENCOUNTER — Encounter: Payer: Self-pay | Admitting: Family Medicine

## 2015-10-09 ENCOUNTER — Ambulatory Visit (INDEPENDENT_AMBULATORY_CARE_PROVIDER_SITE_OTHER): Payer: BLUE CROSS/BLUE SHIELD | Admitting: Family Medicine

## 2015-10-09 VITALS — BP 144/92 | HR 80 | Temp 98.5°F | Ht 69.0 in | Wt 289.4 lb

## 2015-10-09 DIAGNOSIS — I10 Essential (primary) hypertension: Secondary | ICD-10-CM

## 2015-10-09 DIAGNOSIS — F17200 Nicotine dependence, unspecified, uncomplicated: Secondary | ICD-10-CM | POA: Diagnosis not present

## 2015-10-09 MED ORDER — LISINOPRIL 10 MG PO TABS
10.0000 mg | ORAL_TABLET | Freq: Every day | ORAL | Status: DC
Start: 2015-10-09 — End: 2015-10-26

## 2015-10-09 NOTE — Assessment & Plan Note (Signed)
Pt not ready to quit Encouraged him to start cutting down

## 2015-10-09 NOTE — Patient Instructions (Addendum)
Hypertension Hypertension, commonly called high blood pressure, is when the force of blood pumping through your arteries is too strong. Your arteries are the blood vessels that carry blood from your heart throughout your body. A blood pressure reading consists of a higher number over a lower number, such as 110/72. The higher number (systolic) is the pressure inside your arteries when your heart pumps. The lower number (diastolic) is the pressure inside your arteries when your heart relaxes. Ideally you want your blood pressure below 120/80. Hypertension forces your heart to work harder to pump blood. Your arteries may become narrow or stiff. Having untreated or uncontrolled hypertension can cause heart attack, stroke, kidney disease, and other problems. RISK FACTORS Some risk factors for high blood pressure are controllable. Others are not.  Risk factors you cannot control include:   Race. You may be at higher risk if you are African American.  Age. Risk increases with age.  Gender. Men are at higher risk than women before age 45 years. After age 65, women are at higher risk than men. Risk factors you can control include:  Not getting enough exercise or physical activity.  Being overweight.  Getting too much fat, sugar, calories, or salt in your diet.  Drinking too much alcohol. SIGNS AND SYMPTOMS Hypertension does not usually cause signs or symptoms. Extremely high blood pressure (hypertensive crisis) may cause headache, anxiety, shortness of breath, and nosebleed. DIAGNOSIS To check if you have hypertension, your health care provider will measure your blood pressure while you are seated, with your arm held at the level of your heart. It should be measured at least twice using the same arm. Certain conditions can cause a difference in blood pressure between your right and left arms. A blood pressure reading that is higher than normal on one occasion does not mean that you need treatment. If  it is not clear whether you have high blood pressure, you may be asked to return on a different day to have your blood pressure checked again. Or, you may be asked to monitor your blood pressure at home for 1 or more weeks. TREATMENT Treating high blood pressure includes making lifestyle changes and possibly taking medicine. Living a healthy lifestyle can help lower high blood pressure. You may need to change some of your habits. Lifestyle changes may include:  Following the DASH diet. This diet is high in fruits, vegetables, and whole grains. It is low in salt, red meat, and added sugars.  Keep your sodium intake below 2,300 mg per day.  Getting at least 30-45 minutes of aerobic exercise at least 4 times per week.  Losing weight if necessary.  Not smoking.  Limiting alcoholic beverages.  Learning ways to reduce stress. Your health care provider may prescribe medicine if lifestyle changes are not enough to get your blood pressure under control, and if one of the following is true:  You are 18-59 years of age and your systolic blood pressure is above 140.  You are 60 years of age or older, and your systolic blood pressure is above 150.  Your diastolic blood pressure is above 90.  You have diabetes, and your systolic blood pressure is over 140 or your diastolic blood pressure is over 90.  You have kidney disease and your blood pressure is above 140/90.  You have heart disease and your blood pressure is above 140/90. Your personal target blood pressure may vary depending on your medical conditions, your age, and other factors. HOME CARE INSTRUCTIONS    Have your blood pressure rechecked as directed by your health care provider.   Take medicines only as directed by your health care provider. Follow the directions carefully. Blood pressure medicines must be taken as prescribed. The medicine does not work as well when you skip doses. Skipping doses also puts you at risk for  problems.  Do not smoke.   Monitor your blood pressure at home as directed by your health care provider. SEEK MEDICAL CARE IF:   You think you are having a reaction to medicines taken.  You have recurrent headaches or feel dizzy.  You have swelling in your ankles.  You have trouble with your vision. SEEK IMMEDIATE MEDICAL CARE IF:  You develop a severe headache or confusion.  You have unusual weakness, numbness, or feel faint.  You have severe chest or abdominal pain.  You vomit repeatedly.  You have trouble breathing. MAKE SURE YOU:   Understand these instructions.  Will watch your condition.  Will get help right away if you are not doing well or get worse.   This information is not intended to replace advice given to you by your health care provider. Make sure you discuss any questions you have with your health care provider.   Document Released: 09/12/2005 Document Revised: 01/27/2015 Document Reviewed: 07/05/2013 Elsevier Interactive Patient Education 2016 Elsevier Inc[employer] DASH Eating Plan DASH stands for "Dietary Approaches to Stop Hypertension." The DASH eating plan is a healthy eating plan that has been shown to reduce high blood pressure (hypertension). Additional health benefits may include reducing the risk of type 2 diabetes mellitus, heart disease, and stroke. The DASH eating plan may also help with weight loss. WHAT DO I NEED TO KNOW ABOUT THE DASH EATING PLAN? For the DASH eating plan, you will follow these general guidelines:  Choose foods with a percent daily value for sodium of less than 5% (as listed on the food label).  Use salt-free seasonings or herbs instead of table salt or sea salt.  Check with your health care provider or pharmacist before using salt substitutes.  Eat lower-sodium products, often labeled as "lower sodium" or "no salt added."  Eat fresh foods.  Eat more vegetables, fruits, and low-fat dairy products.  Choose whole grains.  Look for the word "whole" as the first word in the ingredient list.  Choose fish and skinless chicken or turkey more often than red meat. Limit fish, poultry, and meat to 6 oz (170 g) each day.  Limit sweets, desserts, sugars, and sugary drinks.  Choose heart-healthy fats.  Limit cheese to 1 oz (28 g) per day.  Eat more home-cooked food and less restaurant, buffet, and fast food.  Limit fried foods.  Cook foods using methods other than frying.  Limit canned vegetables. If you do use them, rinse them well to decrease the sodium.  When eating at a restaurant, ask that your food be prepared with less salt, or no salt if possible. WHAT FOODS CAN I EAT? Seek help from a dietitian for individual calorie needs. Grains Whole grain or whole wheat bread. Brown rice. Whole grain or whole wheat pasta. Quinoa, bulgur, and whole grain cereals. Low-sodium cereals. Corn or whole wheat flour tortillas. Whole grain cornbread. Whole grain crackers. Low-sodium crackers. Vegetables Fresh or frozen vegetables (raw, steamed, roasted, or grilled). Low-sodium or reduced-sodium tomato and vegetable juices. Low-sodium or reduced-sodium tomato sauce and paste. Low-sodium or reduced-sodium canned vegetables.  Fruits All fresh, canned (in natural juice), or frozen fruits. Meat and Other   Protein Products Ground beef (85% or leaner), grass-fed beef, or beef trimmed of fat. Skinless chicken or turkey. Ground chicken or turkey. Pork trimmed of fat. All fish and seafood. Eggs. Dried beans, peas, or lentils. Unsalted nuts and seeds. Unsalted canned beans. Dairy Low-fat dairy products, such as skim or 1% milk, 2% or reduced-fat cheeses, low-fat ricotta or cottage cheese, or plain low-fat yogurt. Low-sodium or reduced-sodium cheeses. Fats and Oils Tub margarines without trans fats. Light or reduced-fat mayonnaise and salad dressings (reduced sodium). Avocado. Safflower, olive, or canola oils. Natural peanut or almond  butter. Other Unsalted popcorn and pretzels. The items listed above may not be a complete list of recommended foods or beverages. Contact your dietitian for more options. WHAT FOODS ARE NOT RECOMMENDED? Grains White bread. White pasta. White rice. Refined cornbread. Bagels and croissants. Crackers that contain trans fat. Vegetables Creamed or fried vegetables. Vegetables in a cheese sauce. Regular canned vegetables. Regular canned tomato sauce and paste. Regular tomato and vegetable juices. Fruits Dried fruits. Canned fruit in light or heavy syrup. Fruit juice. Meat and Other Protein Products Fatty cuts of meat. Ribs, chicken wings, bacon, sausage, bologna, salami, chitterlings, fatback, hot dogs, bratwurst, and packaged luncheon meats. Salted nuts and seeds. Canned beans with salt. Dairy Whole or 2% milk, cream, half-and-half, and cream cheese. Whole-fat or sweetened yogurt. Full-fat cheeses or blue cheese. Nondairy creamers and whipped toppings. Processed cheese, cheese spreads, or cheese curds. Condiments Onion and garlic salt, seasoned salt, table salt, and sea salt. Canned and packaged gravies. Worcestershire sauce. Tartar sauce. Barbecue sauce. Teriyaki sauce. Soy sauce, including reduced sodium. Steak sauce. Fish sauce. Oyster sauce. Cocktail sauce. Horseradish. Ketchup and mustard. Meat flavorings and tenderizers. Bouillon cubes. Hot sauce. Tabasco sauce. Marinades. Taco seasonings. Relishes. Fats and Oils Butter, stick margarine, lard, shortening, ghee, and bacon fat. Coconut, palm kernel, or palm oils. Regular salad dressings. Other Pickles and olives. Salted popcorn and pretzels. The items listed above may not be a complete list of foods and beverages to avoid. Contact your dietitian for more information. WHERE CAN I FIND MORE INFORMATION? National Heart, Lung, and [employer] Institute: www.nhlbi.nih.gov/health/health-topics/topics/dash/   This information is not intended to replace  advice given to you by your health care provider. Make sure you discuss any questions you have with your health care provider.   Document Released: 09/01/2011 Document Revised: 10/03/2014 Document Reviewed: 07/17/2013 Elsevier Interactive Patient Education 2016 Elsevier Inc.  

## 2015-10-09 NOTE — Progress Notes (Signed)
Pre visit review using our clinic review tool, if applicable. No additional management support is needed unless otherwise documented below in the visit note. 

## 2015-10-09 NOTE — Assessment & Plan Note (Signed)
Dash diet Quit smoking meds per orders rto 2-3 weeks Encouraged 10% reduction in weight

## 2015-10-09 NOTE — Progress Notes (Signed)
Patient ID: Kamauri Denardo, male    DOB: 19-Mar-1976  Age: 40 y.o. MRN: [id number]    Subjective:  Subjective HPI Lawrance Wiedemann presents for f/u bp.  His bp has been running high.  No cp, no sob, no palpitations.     Review of Systems  Constitutional: Negative for diaphoresis, appetite change, fatigue and unexpected weight change.  Eyes: Negative for pain, redness and visual disturbance.  Respiratory: Negative for cough, chest tightness, shortness of breath and wheezing.   Cardiovascular: Negative for chest pain, palpitations and leg swelling.  Endocrine: Negative for cold intolerance, heat intolerance, polydipsia, polyphagia and polyuria.  Genitourinary: Negative for dysuria, frequency and difficulty urinating.  Neurological: Negative for dizziness, light-headedness, numbness and headaches.    History Past Medical History  Diagnosis Date  . HYPOTHYROIDISM 03/15/2010  . HYPERLIPIDEMIA 01/26/2010  . THROMBOCYTOPENIA 01/12/2010  . DEPRESSIVE DISORDER 04/27/2010  . CARPAL TUNNEL SYNDROME, BILATERAL 01/04/2010  . Hypertension     He has past surgical history that includes Bone marrow biopsy.   His family history includes Diabetes in his father; Heart disease in his father; Hypertension in his brother, father, and mother; Kidney disease in his father; Thyroid disease in his mother.He reports that he has been smoking.  He does not have any smokeless tobacco history on file. He reports that he does not drink alcohol or use illicit drugs.  Current Outpatient Prescriptions on File Prior to Visit  Medication Sig Dispense Refill  . atorvastatin (LIPITOR) 40 MG tablet TAKE 1 TABLET (40 MG TOTAL) BY MOUTH AT BEDTIME. REPEAT LABS ARE DUE NOW 30 tablet 0  . escitalopram (LEXAPRO) 20 MG tablet TAKE 1 TABLET BY MOUTH EVERY DAY 30 tablet 0  . levothyroxine (SYNTHROID, LEVOTHROID) 200 MCG tablet TAKE 1 TABLET BY MOUTH EVERY DAY 30 tablet 5   No current facility-administered medications on file prior to  visit.     Objective:  Objective Physical Exam  Constitutional: He is oriented to person, place, and time. Vital signs are normal. He appears well-developed and well-nourished. He is sleeping.  HENT:  Head: Normocephalic and atraumatic.  Mouth/Throat: Oropharynx is clear and moist.  Eyes: EOM are normal. Pupils are equal, round, and reactive to light.  Neck: Normal range of motion. Neck supple. No thyromegaly present.  Cardiovascular: Normal rate and regular rhythm.   No murmur heard. Pulmonary/Chest: Effort normal and breath sounds normal. No respiratory distress. He has no wheezes. He has no rales. He exhibits no tenderness.  Musculoskeletal: He exhibits no edema or tenderness.  Neurological: He is alert and oriented to person, place, and time.  Skin: Skin is warm and dry.  Psychiatric: He has a normal mood and affect. His behavior is normal. Judgment and thought content normal.  Nursing note and vitals reviewed.  BP 144/92 mmHg  Pulse 80  Temp(Src) 98.5 F (36.9 C) (Oral)  Ht '5\' 9"'  (1.753 m)  Wt 289 lb 6.4 oz (131.271 kg)  BMI 42.72 kg/m2  SpO2 98% Wt Readings from Last 3 Encounters:  10/09/15 289 lb 6.4 oz (131.271 kg)  08/31/15 286 lb 12.8 oz (130.092 kg)  10/10/14 285 lb (129.275 kg)     Lab Results  Component Value Date   WBC 6.8 08/01/2013   HGB 15.7 08/01/2013   HCT 45.2 08/01/2013   PLT 52.0* 08/01/2013   GLUCOSE 103* 10/10/2014   CHOL 128 10/10/2014   TRIG 104.0 10/10/2014   HDL 28.00* 10/10/2014   LDLDIRECT 141.4 08/01/2013   LDLCALC 79 10/10/2014  ALT 24 10/10/2014   AST 22 10/10/2014   NA 141 10/10/2014   K 4.5 10/10/2014   CL 109 10/10/2014   CREATININE 0.95 10/10/2014   BUN 18 10/10/2014   CO2 27 10/10/2014   TSH 0.88 10/10/2014   MICROALBUR 1.4 10/10/2014    Dg Chest 2 View  12/02/2013  CLINICAL DATA:  Shortness of breath EXAM: CHEST  2 VIEW COMPARISON:  None. FINDINGS: Normal heart size and vascularity. Low lung volumes without pneumonia,  edema, effusion, or pneumothorax. Trachea midline. IMPRESSION: Low volume chest exam without acute process Electronically Signed   By: Daryll Brod M.D.   On: 12/02/2013 17:11     Assessment & Plan:  Plan I have discontinued Mr. Luckadoo's azithromycin. I am also having him start on lisinopril. Additionally, I am having him maintain his levothyroxine, atorvastatin, and escitalopram.  Meds ordered this encounter  Medications  . lisinopril (PRINIVIL,ZESTRIL) 10 MG tablet    Sig: Take 1 tablet (10 mg total) by mouth daily.    Dispense:  30 tablet    Refill:  2    Problem List Items Addressed This Visit      Unprioritized   TOBACCO USE    Pt not ready to quit Encouraged him to start cutting down      HTN (hypertension) - Primary    Dash diet Quit smoking meds per orders rto 2-3 weeks Encouraged 10% reduction in weight      Relevant Medications   lisinopril (PRINIVIL,ZESTRIL) 10 MG tablet      Follow-up: Return in about 2 weeks (around 10/23/2015), or if symptoms worsen or fail to improve, for hypertension, fasting.  Garnet Koyanagi, DO

## 2015-10-24 ENCOUNTER — Other Ambulatory Visit: Payer: Self-pay | Admitting: Family Medicine

## 2015-10-26 ENCOUNTER — Encounter: Payer: Self-pay | Admitting: Family Medicine

## 2015-10-26 ENCOUNTER — Ambulatory Visit (INDEPENDENT_AMBULATORY_CARE_PROVIDER_SITE_OTHER): Payer: BLUE CROSS/BLUE SHIELD | Admitting: Family Medicine

## 2015-10-26 VITALS — BP 120/90 | HR 84 | Temp 97.9°F | Wt 287.4 lb

## 2015-10-26 DIAGNOSIS — F411 Generalized anxiety disorder: Secondary | ICD-10-CM

## 2015-10-26 DIAGNOSIS — I10 Essential (primary) hypertension: Secondary | ICD-10-CM | POA: Diagnosis not present

## 2015-10-26 DIAGNOSIS — E785 Hyperlipidemia, unspecified: Secondary | ICD-10-CM

## 2015-10-26 DIAGNOSIS — F17200 Nicotine dependence, unspecified, uncomplicated: Secondary | ICD-10-CM | POA: Diagnosis not present

## 2015-10-26 DIAGNOSIS — Z114 Encounter for screening for human immunodeficiency virus [HIV]: Secondary | ICD-10-CM

## 2015-10-26 DIAGNOSIS — E039 Hypothyroidism, unspecified: Secondary | ICD-10-CM | POA: Diagnosis not present

## 2015-10-26 LAB — HIV ANTIBODY (ROUTINE TESTING W REFLEX): HIV 1&2 Ab, 4th Generation: NONREACTIVE

## 2015-10-26 LAB — COMPREHENSIVE METABOLIC PANEL
ALK PHOS: 122 U/L — AB (ref 39–117)
ALT: 26 U/L (ref 0–53)
AST: 20 U/L (ref 0–37)
Albumin: 4.4 g/dL (ref 3.5–5.2)
BILIRUBIN TOTAL: 0.6 mg/dL (ref 0.2–1.2)
BUN: 22 mg/dL (ref 6–23)
CO2: 29 meq/L (ref 19–32)
Calcium: 9.7 mg/dL (ref 8.4–10.5)
Chloride: 106 mEq/L (ref 96–112)
Creatinine, Ser: 1.09 mg/dL (ref 0.40–1.50)
GFR: 80.8 mL/min (ref >60.00)
GLUCOSE: 105 mg/dL — AB (ref 70–99)
Potassium: 4.8 mEq/L (ref 3.5–5.1)
SODIUM: 141 meq/L (ref 135–145)
TOTAL PROTEIN: 7.3 g/dL (ref 6.0–8.3)

## 2015-10-26 LAB — LIPID PANEL
Cholesterol: 137 mg/dL (ref 0–200)
HDL: 35.3 mg/dL — AB (ref >39.00)
LDL Cholesterol: 86 mg/dL (ref 0–99)
NONHDL: 101.78
TRIGLYCERIDES: 80 mg/dL (ref 0.0–149.0)
Total CHOL/HDL Ratio: 4
VLDL: 16 mg/dL (ref 0.0–40.0)

## 2015-10-26 LAB — TSH: TSH: 4.23 u[IU]/mL (ref 0.35–4.50)

## 2015-10-26 MED ORDER — ESCITALOPRAM OXALATE 20 MG PO TABS: 20.0000 mg | ORAL_TABLET | Freq: Every day | ORAL | Status: DC

## 2015-10-26 MED ORDER — LEVOTHYROXINE SODIUM 200 MCG PO TABS: 200.0000 ug | ORAL_TABLET | Freq: Every day | ORAL | Status: DC

## 2015-10-26 MED ORDER — ATORVASTATIN CALCIUM 40 MG PO TABS: ORAL_TABLET | ORAL | Status: DC

## 2015-10-26 MED ORDER — NICOTINE 10 MG IN INHA: 1.0000 | RESPIRATORY_TRACT | Status: DC | PRN

## 2015-10-26 MED ORDER — LISINOPRIL 20 MG PO TABS: 20.0000 mg | ORAL_TABLET | Freq: Every day | ORAL | Status: DC

## 2015-10-26 NOTE — Patient Instructions (Signed)

## 2015-10-26 NOTE — Progress Notes (Signed)
Pre visit review using our clinic review tool, if applicable. No additional management support is needed unless otherwise documented below in the visit note. 

## 2015-10-26 NOTE — Progress Notes (Signed)
Subjective:    Patient ID: Daniel Acevedo, male    DOB: May 01, 1976, 40 y.o.   MRN: [id number]  HPI    Review of Systems     Objective:   Physical Exam        Assessment & Plan:   Subjective:    Patient here for follow-up of elevated blood pressure, cholesterol and hypothroidism.   He is exercising and is adherent to a low-salt diet.  Blood pressure is not well controlled at home. Cardiac symptoms: none. Patient denies: chest pain, chest pressure/discomfort, claudication, dyspnea, exertional chest pressure/discomfort, fatigue, irregular heart beat, lower extremity edema, near-syncope, orthopnea, palpitations, paroxysmal nocturnal dyspnea, syncope and tachypnea. Cardiovascular risk factors: dyslipidemia, hypertension, male gender, obesity (BMI >= 30 kg/m2) and smoking/ tobacco exposure. Use of agents associated with hypertension: none. History of target organ damage: none.  The following portions of the patient's history were reviewed and updated as appropriate:  He  has a past medical history of HYPOTHYROIDISM (03/15/2010); HYPERLIPIDEMIA (01/26/2010); THROMBOCYTOPENIA (01/12/2010); DEPRESSIVE DISORDER (04/27/2010); CARPAL TUNNEL SYNDROME, BILATERAL (01/04/2010); and Hypertension. He  does not have any pertinent problems on file. He  has past surgical history that includes Bone marrow biopsy. His family history includes Diabetes in his brother and father; Heart disease in his father; Heart disease (age of onset: 67) in his brother; Hyperlipidemia in his brother; Hypertension in his brother, brother, father, and mother; Kidney disease in his father; Thyroid disease in his mother. He  reports that he has been smoking.  He does not have any smokeless tobacco history on file. He reports that he does not drink alcohol or use illicit drugs. He has a current medication list which includes the following prescription(s): atorvastatin, escitalopram, levothyroxine, and lisinopril. Current Outpatient  Prescriptions on File Prior to Visit  Medication Sig Dispense Refill  . atorvastatin (LIPITOR) 40 MG tablet TAKE 1 TABLET (40 MG TOTAL) BY MOUTH AT BEDTIME. REPEAT LABS ARE DUE NOW 30 tablet 0  . escitalopram (LEXAPRO) 20 MG tablet TAKE 1 TABLET BY MOUTH EVERY DAY 30 tablet 0  . levothyroxine (SYNTHROID, LEVOTHROID) 200 MCG tablet TAKE 1 TABLET BY MOUTH EVERY DAY 30 tablet 5  . lisinopril (PRINIVIL,ZESTRIL) 10 MG tablet Take 1 tablet (10 mg total) by mouth daily. 30 tablet 2   No current facility-administered medications on file prior to visit.   He has No Known Allergies..  Review of Systems Pertinent items are noted in HPI.     Objective:    BP 120/90 mmHg  Pulse 84  Temp(Src) 97.9 F (36.6 C) (Oral)  Wt 287 lb 6.4 oz (130.364 kg)  SpO2 98% General appearance: alert, cooperative, appears stated age and no distress Neck: no adenopathy, no carotid bruit, no JVD, supple, symmetrical, trachea midline and thyroid not enlarged, symmetric, no tenderness/mass/nodules Lungs: clear to auscultation bilaterally Heart: S1, S2 normal Extremities: extremities normal, atraumatic, no cyanosis or edema    Assessment:    Hypertension, stage 1 . Evidence of target organ damage: none.    Plan:    Medication: increase to lisinopril 20 mg. Dietary sodium restriction. Regular aerobic exercise. Check blood pressures 2-3 times weekly and record. Follow up: 6 months and as needed.    1. Essential hypertension See above - Comp Met (CMET) - Lipid panel - TSH - lisinopril (PRINIVIL,ZESTRIL) 20 MG tablet; Take 1 tablet (20 mg total) by mouth daily.  Dispense: 90 tablet; Refill: 3  2. Hyperlipidemia Check labs - Comp Met (CMET) - Lipid panel - TSH -  atorvastatin (LIPITOR) 40 MG tablet; TAKE 1 TABLET (40 MG TOTAL) BY MOUTH AT BEDTIME. REPEAT LABS ARE DUE NOW  Dispense: 30 tablet; Refill: 0  3. Hypothyroidism, unspecified hypothyroidism type  - TSH - levothyroxine (SYNTHROID, LEVOTHROID)  200 MCG tablet; Take 1 tablet (200 mcg total) by mouth daily.  Dispense: 30 tablet; Refill: 5  4. Tobacco use disorder  - nicotine (NICOTROL) 10 MG inhaler; Inhale 1 cartridge (1 continuous puffing total) into the lungs as needed for smoking cessation.  Dispense: 42 each; Refill: 2  5. Generalized anxiety disorder stable - escitalopram (LEXAPRO) 20 MG tablet; Take 1 tablet (20 mg total) by mouth daily.  Dispense: 30 tablet; Refill: 0  6. Screening for HIV (human immunodeficiency virus)  - HIV antibody

## 2015-10-27 ENCOUNTER — Other Ambulatory Visit: Payer: Self-pay | Admitting: Family Medicine

## 2015-12-26 ENCOUNTER — Other Ambulatory Visit: Payer: Self-pay | Admitting: Family Medicine

## 2016-05-23 ENCOUNTER — Other Ambulatory Visit: Payer: Self-pay | Admitting: Family Medicine

## 2016-05-23 ENCOUNTER — Ambulatory Visit (INDEPENDENT_AMBULATORY_CARE_PROVIDER_SITE_OTHER): Payer: BLUE CROSS/BLUE SHIELD | Admitting: Family Medicine

## 2016-05-23 ENCOUNTER — Encounter: Payer: Self-pay | Admitting: Family Medicine

## 2016-05-23 VITALS — BP 106/70 | HR 70 | Temp 98.2°F | Ht 69.0 in | Wt 285.0 lb

## 2016-05-23 DIAGNOSIS — E039 Hypothyroidism, unspecified: Secondary | ICD-10-CM

## 2016-05-23 DIAGNOSIS — Z87898 Personal history of other specified conditions: Secondary | ICD-10-CM

## 2016-05-23 DIAGNOSIS — F411 Generalized anxiety disorder: Secondary | ICD-10-CM

## 2016-05-23 DIAGNOSIS — R0683 Snoring: Secondary | ICD-10-CM | POA: Diagnosis not present

## 2016-05-23 DIAGNOSIS — F17200 Nicotine dependence, unspecified, uncomplicated: Secondary | ICD-10-CM

## 2016-05-23 DIAGNOSIS — D696 Thrombocytopenia, unspecified: Secondary | ICD-10-CM

## 2016-05-23 DIAGNOSIS — E785 Hyperlipidemia, unspecified: Secondary | ICD-10-CM | POA: Diagnosis not present

## 2016-05-23 DIAGNOSIS — I10 Essential (primary) hypertension: Secondary | ICD-10-CM

## 2016-05-23 LAB — COMPREHENSIVE METABOLIC PANEL
ALK PHOS: 130 U/L — AB (ref 39–117)
ALT: 26 U/L (ref 0–53)
AST: 20 U/L (ref 0–37)
Albumin: 4.3 g/dL (ref 3.5–5.2)
BUN: 12 mg/dL (ref 6–23)
CO2: 32 meq/L (ref 19–32)
Calcium: 9.5 mg/dL (ref 8.4–10.5)
Chloride: 105 mEq/L (ref 96–112)
Creatinine, Ser: 0.95 mg/dL (ref 0.40–1.50)
GFR: 94.4 mL/min (ref >60.00)
GLUCOSE: 98 mg/dL (ref 70–99)
POTASSIUM: 4.7 meq/L (ref 3.5–5.1)
SODIUM: 142 meq/L (ref 135–145)
TOTAL PROTEIN: 7 g/dL (ref 6.0–8.3)
Total Bilirubin: 0.8 mg/dL (ref 0.2–1.2)

## 2016-05-23 LAB — LIPID PANEL
CHOL/HDL RATIO: 6
Cholesterol: 153 mg/dL (ref 0–200)
HDL: 27.5 mg/dL — ABNORMAL LOW (ref >39.00)
LDL Cholesterol: 91 mg/dL (ref 0–99)
NONHDL: 125.96
Triglycerides: 176 mg/dL — ABNORMAL HIGH (ref 0.0–149.0)
VLDL: 35.2 mg/dL (ref 0.0–40.0)

## 2016-05-23 LAB — CBC WITH DIFFERENTIAL/PLATELET
BASOS PCT: 0.3 % (ref 0.0–3.0)
Basophils Absolute: 0 10*3/uL (ref 0.0–0.1)
EOS PCT: 2.5 % (ref 0.0–5.0)
Eosinophils Absolute: 0.2 10*3/uL (ref 0.0–0.7)
HCT: 45.5 % (ref 39.0–52.0)
Hemoglobin: 16 g/dL (ref 13.0–17.0)
LYMPHS ABS: 1.8 10*3/uL (ref 0.7–4.0)
Lymphocytes Relative: 20.1 % (ref 12.0–46.0)
MCHC: 35.2 g/dL (ref 30.0–36.0)
MCV: 85.2 fl (ref 78.0–100.0)
MONO ABS: 0.6 10*3/uL (ref 0.1–1.0)
Monocytes Relative: 6.3 % (ref 3.0–12.0)
NEUTROS ABS: 6.3 10*3/uL (ref 1.4–7.7)
NEUTROS PCT: 70.8 % (ref 43.0–77.0)
PLATELETS: 55 10*3/uL — AB (ref 150.0–400.0)
RBC: 5.34 Mil/uL (ref 4.22–5.81)
RDW: 13.1 % (ref 11.5–15.5)
WBC: 8.9 10*3/uL (ref 4.0–10.5)

## 2016-05-23 LAB — TSH: TSH: 0.14 u[IU]/mL — AB (ref 0.35–4.50)

## 2016-05-23 MED ORDER — LISINOPRIL 20 MG PO TABS: 20.0000 mg | ORAL_TABLET | Freq: Every day | ORAL | 3 refills | Status: DC

## 2016-05-23 MED ORDER — ESCITALOPRAM OXALATE 10 MG PO TABS: 10.0000 mg | ORAL_TABLET | Freq: Every day | ORAL | 2 refills | Status: DC

## 2016-05-23 MED ORDER — ATORVASTATIN CALCIUM 40 MG PO TABS: 40.0000 mg | ORAL_TABLET | Freq: Every day | ORAL | 5 refills | Status: DC

## 2016-05-23 MED ORDER — LEVOTHYROXINE SODIUM 200 MCG PO TABS: 200.0000 ug | ORAL_TABLET | Freq: Every day | ORAL | 8 refills | Status: DC

## 2016-05-23 NOTE — Patient Instructions (Signed)
Smoking Cessation, Tips for Success If you are ready to quit smoking, congratulations! You have chosen to help yourself be healthier. Cigarettes bring nicotine, tar, carbon monoxide, and other irritants into your body. Your lungs, heart, and blood vessels will be able to work better without these poisons. There are many different ways to quit smoking. Nicotine gum, nicotine patches, a nicotine inhaler, or nicotine nasal spray can help with physical craving. Hypnosis, support groups, and medicines help break the habit of smoking. WHAT THINGS CAN I DO TO MAKE QUITTING EASIER?  Here are some tips to help you quit for good:  Pick a date when you will quit smoking completely. Tell all of your friends and family about your plan to quit on that date.  Do not try to slowly cut down on the number of cigarettes you are smoking. Pick a quit date and quit smoking completely starting on that day.  Throw away all cigarettes.   Clean and remove all ashtrays from your home, work, and car.  On a card, write down your reasons for quitting. Carry the card with you and read it when you get the urge to smoke.  Cleanse your body of nicotine. Drink enough water and fluids to keep your urine clear or pale yellow. Do this after quitting to flush the nicotine from your body.  Learn to predict your moods. Do not let a bad situation be your excuse to have a cigarette. Some situations in your life might tempt you into wanting a cigarette.  Never have "just one" cigarette. It leads to wanting another and another. Remind yourself of your decision to quit.  Change habits associated with smoking. If you smoked while driving or when feeling stressed, try other activities to replace smoking. Stand up when drinking your coffee. Brush your teeth after eating. Sit in a different chair when you read the paper. Avoid alcohol while trying to quit, and try to drink fewer caffeinated beverages. Alcohol and caffeine may urge you to  smoke.  Avoid foods and drinks that can trigger a desire to smoke, such as sugary or spicy foods and alcohol.  Ask people who smoke not to smoke around you.  Have something planned to do right after eating or having a cup of coffee. For example, plan to take a walk or exercise.  Try a relaxation exercise to calm you down and decrease your stress. Remember, you may be tense and nervous for the first 2 weeks after you quit, but this will pass.  Find new activities to keep your hands busy. Play with a pen, coin, or rubber band. Doodle or draw things on paper.  Brush your teeth right after eating. This will help cut down on the craving for the taste of tobacco after meals. You can also try mouthwash.   Use oral substitutes in place of cigarettes. Try using lemon drops, carrots, cinnamon sticks, or chewing gum. Keep them handy so they are available when you have the urge to smoke.  When you have the urge to smoke, try deep breathing.  Designate your home as a nonsmoking area.  If you are a heavy smoker, ask your health care provider about a prescription for nicotine chewing gum. It can ease your withdrawal from nicotine.  Reward yourself. Set aside the cigarette money you save and buy yourself something nice.  Look for support from others. Join a support group or smoking cessation program. Ask someone at home or at work to help you with your plan   to quit smoking.  Always ask yourself, "Do I need this cigarette or is this just a reflex?" Tell yourself, "Today, I choose not to smoke," or "I do not want to smoke." You are reminding yourself of your decision to quit.  Do not replace cigarette smoking with electronic cigarettes (commonly called e-cigarettes). The safety of e-cigarettes is unknown, and some may contain harmful chemicals.  If you relapse, do not give up! Plan ahead and think about what you will do the next time you get the urge to smoke. HOW WILL I FEEL WHEN I QUIT SMOKING? You  may have symptoms of withdrawal because your body is used to nicotine (the addictive substance in cigarettes). You may crave cigarettes, be irritable, feel very hungry, cough often, get headaches, or have difficulty concentrating. The withdrawal symptoms are only temporary. They are strongest when you first quit but will go away within 10-14 days. When withdrawal symptoms occur, stay in control. Think about your reasons for quitting. Remind yourself that these are signs that your body is healing and getting used to being without cigarettes. Remember that withdrawal symptoms are easier to treat than the major diseases that smoking can cause.  Even after the withdrawal is over, expect periodic urges to smoke. However, these cravings are generally short lived and will go away whether you smoke or not. Do not smoke! WHAT RESOURCES ARE AVAILABLE TO HELP ME QUIT SMOKING? Your health care provider can direct you to community resources or hospitals for support, which may include:  Group support.  Education.  Hypnosis.  Therapy.   This information is not intended to replace advice given to you by your health care provider. Make sure you discuss any questions you have with your health care provider.   Document Released: 06/10/2004 Document Revised: 10/03/2014 Document Reviewed: 02/28/2013 Elsevier Interactive Patient Education 2016 Else[employer] Inc.  

## 2016-05-23 NOTE — Progress Notes (Signed)
Pre visit review using our clinic review tool, if applicable. No additional management support is needed unless otherwise documented below in the visit note. 

## 2016-05-23 NOTE — Assessment & Plan Note (Signed)
Pt admitted to not trying hard to quit because he is just not ready.   He understands the risks but is just not wanting to quit right now.  His wife smokes as well.

## 2016-05-23 NOTE — Progress Notes (Signed)
Patient ID: Daniel Acevedo, male    DOB: 03-19-1976  Age: 40 y.o. MRN: [id number]    Subjective:  Subjective  HPI Daniel Acevedo presents for f/u bp but also c/o extreme fatigue.  He does not sleep well.  His wife is present and states he snores and he never feels rested in the am.   No other complaints.    Review of Systems  Constitutional: Positive for fatigue. Negative for appetite change, diaphoresis and unexpected weight change.  Eyes: Negative for pain, redness and visual disturbance.  Respiratory: Negative for cough, chest tightness, shortness of breath and wheezing.   Cardiovascular: Negative for chest pain, palpitations and leg swelling.  Endocrine: Negative for cold intolerance, heat intolerance, polydipsia, polyphagia and polyuria.  Genitourinary: Negative for difficulty urinating, dysuria and frequency.  Neurological: Negative for dizziness, light-headedness, numbness and headaches.    History Past Medical History:  Diagnosis Date  . CARPAL TUNNEL SYNDROME, BILATERAL 01/04/2010  . DEPRESSIVE DISORDER 04/27/2010  . HYPERLIPIDEMIA 01/26/2010  . Hypertension   . HYPOTHYROIDISM 03/15/2010  . THROMBOCYTOPENIA 01/12/2010    He has a past surgical history that includes Bone marrow biopsy.   His family history includes Diabetes in his brother and father; Heart disease in his father; Heart disease (age of onset: 7) in his brother; Hyperlipidemia in his brother; Hypertension in his brother, brother, father, and mother; Kidney disease in his father; Thyroid disease in his mother.He reports that he has been smoking.  He has a 33.00 pack-year smoking history. He does not have any smokeless tobacco history on file. He reports that he does not drink alcohol or use drugs.  No current outpatient prescriptions on file prior to visit.   No current facility-administered medications on file prior to visit.      Objective:  Objective  Physical Exam  Constitutional: He is oriented to person,  place, and time. Vital signs are normal. He appears well-developed and well-nourished. He is sleeping.  HENT:  Head: Normocephalic and atraumatic.  Mouth/Throat: Oropharynx is clear and moist.  Eyes: EOM are normal. Pupils are equal, round, and reactive to light.  Neck: Normal range of motion. Neck supple. No thyromegaly present.  Cardiovascular: Normal rate and regular rhythm.   No murmur heard. Pulmonary/Chest: Effort normal and breath sounds normal. No respiratory distress. He has no wheezes. He has no rales. He exhibits no tenderness.  Musculoskeletal: He exhibits no edema or tenderness.  Neurological: He is alert and oriented to person, place, and time.  Skin: Skin is warm and dry.  Psychiatric: He has a normal mood and affect. His behavior is normal. Judgment and thought content normal.   BP 106/70 (BP Location: Left Arm, Patient Position: Sitting, Cuff Size: Large)   Pulse 70   Temp 98.2 F (36.8 C) (Oral)   Ht '5\' 9"'  (1.753 m)   Wt 285 lb (129.3 kg)   SpO2 98%   BMI 42.09 kg/m  Wt Readings from Last 3 Encounters:  05/23/16 285 lb (129.3 kg)  10/26/15 287 lb 6.4 oz (130.4 kg)  10/09/15 289 lb 6.4 oz (131.3 kg)     Lab Results  Component Value Date   WBC 8.9 05/23/2016   HGB 16.0 05/23/2016   HCT 45.5 05/23/2016   PLT 55.0 (L) 05/23/2016   GLUCOSE 98 05/23/2016   CHOL 153 05/23/2016   TRIG 176.0 (H) 05/23/2016   HDL 27.50 (L) 05/23/2016   LDLDIRECT 141.4 08/01/2013   LDLCALC 91 05/23/2016   ALT 26 05/23/2016  AST 20 05/23/2016   NA 142 05/23/2016   K 4.7 05/23/2016   CL 105 05/23/2016   CREATININE 0.95 05/23/2016   BUN 12 05/23/2016   CO2 32 05/23/2016   TSH 0.14 (L) 05/23/2016   MICROALBUR 1.4 10/10/2014    Dg Chest 2 View  Result Date: 12/02/2013 CLINICAL DATA:  Shortness of breath EXAM: CHEST  2 VIEW COMPARISON:  None. FINDINGS: Normal heart size and vascularity. Low lung volumes without pneumonia, edema, effusion, or pneumothorax. Trachea midline.  IMPRESSION: Low volume chest exam without acute process Electronically Signed   By: Daryll Brod M.D.   On: 12/02/2013 17:11     Assessment & Plan:  Plan  I have discontinued Daniel Acevedo nicotine and escitalopram. I have also changed his levothyroxine. Additionally, I am having him start on escitalopram. Lastly, I am having him maintain his lisinopril and atorvastatin.  Meds ordered this encounter  Medications  . escitalopram (LEXAPRO) 10 MG tablet    Sig: Take 1 tablet (10 mg total) by mouth daily.    Dispense:  30 tablet    Refill:  2  . lisinopril (PRINIVIL,ZESTRIL) 20 MG tablet    Sig: Take 1 tablet (20 mg total) by mouth daily.    Dispense:  90 tablet    Refill:  3  . levothyroxine (SYNTHROID, LEVOTHROID) 200 MCG tablet    Sig: Take 1 tablet (200 mcg total) by mouth daily.    Dispense:  30 tablet    Refill:  8  . atorvastatin (LIPITOR) 40 MG tablet    Sig: Take 1 tablet (40 mg total) by mouth daily at 6 PM.    Dispense:  30 tablet    Refill:  5    Problem List Items Addressed This Visit      Unprioritized   HTN (hypertension)   Relevant Medications   lisinopril (PRINIVIL,ZESTRIL) 20 MG tablet   atorvastatin (LIPITOR) 40 MG tablet   Hyperlipidemia    con't lipitor Check labs      Relevant Medications   lisinopril (PRINIVIL,ZESTRIL) 20 MG tablet   atorvastatin (LIPITOR) 40 MG tablet   TOBACCO USE    Pt admitted to not trying hard to quit because he is just not ready.   He understands the risks but is just not wanting to quit right now.  His wife smokes as well.        Other Visit Diagnoses    Snoring    -  Primary   Relevant Orders   Ambulatory referral to Pulmonology   TSH (Completed)   Lipid panel (Completed)   Comprehensive metabolic panel (Completed)   CBC with Differential/Platelet (Completed)   H/O fatigue       Relevant Orders   TSH (Completed)   Lipid panel (Completed)   Comprehensive metabolic panel (Completed)   CBC with Differential/Platelet  (Completed)   Hypothyroidism, unspecified hypothyroidism type       Relevant Medications   levothyroxine (SYNTHROID, LEVOTHROID) 200 MCG tablet   Other Relevant Orders   TSH (Completed)   Hyperlipidemia LDL goal <100       Relevant Medications   lisinopril (PRINIVIL,ZESTRIL) 20 MG tablet   atorvastatin (LIPITOR) 40 MG tablet   Other Relevant Orders   Lipid panel (Completed)   Generalized anxiety disorder          Follow-up: Return in about 6 months (around 11/23/2016) for annual exam, fasting.  Ann Held, DO

## 2016-05-23 NOTE — Assessment & Plan Note (Signed)
con't lipitor Check labs 

## 2016-05-26 NOTE — Addendum Note (Signed)
Addended byConrad Alsen: Devaris Quirk D on: 05/26/2016 03:27 PM   Modules accepted: Orders

## 2016-06-01 DIAGNOSIS — K1370 Unspecified lesions of oral mucosa: Secondary | ICD-10-CM | POA: Diagnosis not present

## 2016-06-01 DIAGNOSIS — K089 Disorder of teeth and supporting structures, unspecified: Secondary | ICD-10-CM | POA: Diagnosis not present

## 2016-06-23 ENCOUNTER — Encounter: Payer: Self-pay | Admitting: Hematology & Oncology

## 2016-06-23 ENCOUNTER — Other Ambulatory Visit (HOSPITAL_BASED_OUTPATIENT_CLINIC_OR_DEPARTMENT_OTHER): Payer: BLUE CROSS/BLUE SHIELD

## 2016-06-23 ENCOUNTER — Ambulatory Visit: Payer: BLUE CROSS/BLUE SHIELD

## 2016-06-23 ENCOUNTER — Ambulatory Visit (HOSPITAL_BASED_OUTPATIENT_CLINIC_OR_DEPARTMENT_OTHER): Payer: BLUE CROSS/BLUE SHIELD | Admitting: Hematology & Oncology

## 2016-06-23 VITALS — BP 128/78 | HR 75 | Temp 98.1°F | Resp 18 | Ht 69.0 in | Wt 289.0 lb

## 2016-06-23 DIAGNOSIS — D693 Immune thrombocytopenic purpura: Secondary | ICD-10-CM

## 2016-06-23 DIAGNOSIS — D696 Thrombocytopenia, unspecified: Secondary | ICD-10-CM

## 2016-06-23 LAB — CBC WITH DIFFERENTIAL (CANCER CENTER ONLY)
BASO#: 0 10*3/uL (ref 0.0–0.2)
BASO%: 0.2 % (ref 0.0–2.0)
EOS ABS: 0.3 10*3/uL (ref 0.0–0.5)
EOS%: 3.3 % (ref 0.0–7.0)
HCT: 42.8 % (ref 38.7–49.9)
HEMOGLOBIN: 15.1 g/dL (ref 13.0–17.1)
LYMPH#: 1.7 10*3/uL (ref 0.9–3.3)
LYMPH%: 19.3 % (ref 14.0–48.0)
MCH: 30.3 pg (ref 28.0–33.4)
MCHC: 35.3 g/dL (ref 32.0–35.9)
MCV: 86 fL (ref 82–98)
MONO#: 0.5 10*3/uL (ref 0.1–0.9)
MONO%: 6 % (ref 0.0–13.0)
NEUT%: 71.2 % (ref 40.0–80.0)
NEUTROS ABS: 6.3 10*3/uL (ref 1.5–6.5)
Platelets: 55 10*3/uL — ABNORMAL LOW (ref 145–400)
RBC: 4.98 10*6/uL (ref 4.20–5.70)
RDW: 13 % (ref 11.1–15.7)
WBC: 8.9 10*3/uL (ref 4.0–10.0)

## 2016-06-23 LAB — TECHNOLOGIST REVIEW CHCC SATELLITE

## 2016-06-23 LAB — LACTATE DEHYDROGENASE: LDH: 308 U/L — AB (ref 125–245)

## 2016-06-23 LAB — CHCC SATELLITE - SMEAR

## 2016-06-23 NOTE — Progress Notes (Signed)
Hematology and Oncology Follow Up Visit  Daniel Acevedo [id number] March 08, 1976 40 y.o. 06/23/2016   Principle Diagnosis:   Chronic immune thrombocytopenia  Current Therapy:    Observation     Interim History:  [name] is back after a very long absence. I they will last saw him about 3 or 4 years ago. He has chronic immune thrombocytopenia. When I last saw him back in 2013, his platelet was 54,000.  He's had no issues to date. He has no bleeding or bruising. He is still working. He's had no fatigue or weakness. Has been no weight loss or weight gain. He's had no nausea or vomiting. He's had no rashes. He's had no change in bowel or bladder habits.  We did a bone marrow biopsy on him back in 2011. This showed megakaryocytes that were abundant which was consistent with immune-based thrombocytopenia.  He has had no cough. Again, he's had no rashes.  He is still working.  Overall, his performance status is ECOG 1.    Medications:  Current Outpatient Prescriptions:  .  atorvastatin (LIPITOR) 40 MG tablet, Take 1 tablet (40 mg total) by mouth daily at 6 PM., Disp: 30 tablet, Rfl: 5 .  escitalopram (LEXAPRO) 10 MG tablet, Take 1 tablet (10 mg total) by mouth daily., Disp: 30 tablet, Rfl: 2 .  levothyroxine (SYNTHROID, LEVOTHROID) 200 MCG tablet, Take 1 tablet (200 mcg total) by mouth daily., Disp: 30 tablet, Rfl: 8 .  lisinopril (PRINIVIL,ZESTRIL) 20 MG tablet, Take 1 tablet (20 mg total) by mouth daily., Disp: 90 tablet, Rfl: 3  Allergies: No Known Allergies  Past Medical History, Surgical history, Social history, and Family History were reviewed and updated.  Review of Systems:  As above  Physical Exam:  height is '5\' 9"'  (1.753 m) and weight is 289 lb (131.1 kg). His oral temperature is 98.1 F (36.7 C). His blood pressure is 128/78 and his pulse is 75. His respiration is 18.   Wt Readings from Last 3 Encounters:  06/23/16 289 lb (131.1 kg)  05/23/16 285 lb (129.3 kg)    10/26/15 287 lb 6.4 oz (130.4 kg)      Obese white male in no obvious distress. Head and neck exam shows no ocular or oral lesions. He has no palpable cervical or supraclavicular lymph nodes. Lungs are clear. Cardiac exam regular rate and rhythm with no murmurs, rubs or bruits. Abdomen is soft. He has good bowel sounds. There is no fluid wave. There is no palpable liver or spleen tip. Back exam shows no tenderness over the spine, ribs or hips. Extremities shows no clubbing, cyanosis or edema. Neurological exam shows no focal neurological deficits. Skin exam shows no rashes, ecchymoses or petechia.  Lab Results  Component Value Date   WBC 8.9 06/23/2016   HGB 15.1 06/23/2016   HCT 42.8 06/23/2016   MCV 86 06/23/2016   PLT 55 (L) 06/23/2016     Chemistry      Component Value Date/Time   NA 142 05/23/2016 1119   K 4.7 05/23/2016 1119   CL 105 05/23/2016 1119   CO2 32 05/23/2016 1119   BUN 12 05/23/2016 1119   CREATININE 0.95 05/23/2016 1119      Component Value Date/Time   CALCIUM 9.5 05/23/2016 1119   ALKPHOS 130 (H) 05/23/2016 1119   AST 20 05/23/2016 1119   ALT 26 05/23/2016 1119   BILITOT 0.8 05/23/2016 1119         Impression and Plan: [name]  is a 40 year old white male with chronic thrombocytopenia. This is chronic immune-based thrombocytopenia.  Again, we did a bone marrow biopsy on him. This showed [place] what the problem was. He is asymptomatic right now. I don't think we have to do anything for him.  For right now, we will just follow him along yearly. He knows that he can was call us and come in if he has any problems with bleeding or bruising. I told him what to watch out for.  It was nice to see him again. It has been a long time. Both he and his wife still look the same. It was fun talking to him about the [place].  I spent about 35 minutes with he and his wife.   Volanda Napoleon, MD 9/28/20175:22 PM

## 2016-08-20 DIAGNOSIS — R05 Cough: Secondary | ICD-10-CM | POA: Diagnosis not present

## 2016-08-20 DIAGNOSIS — H66001 Acute suppurative otitis media without spontaneous rupture of ear drum, right ear: Secondary | ICD-10-CM | POA: Diagnosis not present

## 2016-10-22 ENCOUNTER — Other Ambulatory Visit: Payer: Self-pay | Admitting: Family Medicine

## 2016-10-22 DIAGNOSIS — I10 Essential (primary) hypertension: Secondary | ICD-10-CM

## 2016-11-19 ENCOUNTER — Other Ambulatory Visit: Payer: Self-pay | Admitting: Family Medicine

## 2017-03-18 ENCOUNTER — Other Ambulatory Visit: Payer: Self-pay | Admitting: Family Medicine

## 2017-03-21 NOTE — Telephone Encounter (Signed)
Pt Is due for fasting physical please call and schedule appointment.

## 2017-03-24 ENCOUNTER — Ambulatory Visit (INDEPENDENT_AMBULATORY_CARE_PROVIDER_SITE_OTHER): Payer: BLUE CROSS/BLUE SHIELD | Admitting: Family Medicine

## 2017-03-24 ENCOUNTER — Encounter: Payer: Self-pay | Admitting: Family Medicine

## 2017-03-24 VITALS — BP 130/80 | HR 90 | Temp 98.1°F | Resp 16 | Ht 69.0 in | Wt 306.2 lb

## 2017-03-24 DIAGNOSIS — I10 Essential (primary) hypertension: Secondary | ICD-10-CM | POA: Diagnosis not present

## 2017-03-24 DIAGNOSIS — E039 Hypothyroidism, unspecified: Secondary | ICD-10-CM

## 2017-03-24 DIAGNOSIS — E785 Hyperlipidemia, unspecified: Secondary | ICD-10-CM

## 2017-03-24 LAB — CBC WITH DIFFERENTIAL/PLATELET
BASOS ABS: 0 {cells}/uL (ref 0–200)
Basophils Relative: 0 %
EOS PCT: 3 %
Eosinophils Absolute: 288 cells/uL (ref 15–500)
HEMATOCRIT: 44.3 % (ref 38.5–50.0)
Hemoglobin: 15.2 g/dL (ref 13.2–17.1)
LYMPHS ABS: 1632 {cells}/uL (ref 850–3900)
Lymphocytes Relative: 17 %
MCH: 29.6 pg (ref 27.0–33.0)
MCHC: 34.3 g/dL (ref 32.0–36.0)
MCV: 86.4 fL (ref 80.0–100.0)
MONO ABS: 672 {cells}/uL (ref 200–950)
MPV: 13 fL — ABNORMAL HIGH (ref 7.5–12.5)
Monocytes Relative: 7 %
NEUTROS ABS: 7008 {cells}/uL (ref 1500–7800)
Neutrophils Relative %: 73 %
PLATELETS: 73 10*3/uL — AB (ref 140–400)
RBC: 5.13 MIL/uL (ref 4.20–5.80)
RDW: 13.7 % (ref 11.0–15.0)
WBC: 9.6 10*3/uL (ref 3.8–10.8)

## 2017-03-24 LAB — POC URINALSYSI DIPSTICK (AUTOMATED)
Bilirubin, UA: NEGATIVE
GLUCOSE UA: NEGATIVE
Ketones, UA: NEGATIVE
Leukocytes, UA: NEGATIVE
Nitrite, UA: NEGATIVE
Protein, UA: NEGATIVE
RBC UA: NEGATIVE
SPEC GRAV UA: 1.015 (ref 1.010–1.025)
Urobilinogen, UA: 1 E.U./dL
pH, UA: 7.5 (ref 5.0–8.0)

## 2017-03-24 LAB — T3, FREE: T3, Free: 2.3 pg/mL (ref 2.3–4.2)

## 2017-03-24 LAB — T4, FREE: Free T4: 0.9 ng/dL (ref 0.8–1.8)

## 2017-03-24 LAB — TSH: TSH: 20.2 mIU/L — ABNORMAL HIGH (ref 0.40–4.50)

## 2017-03-24 MED ORDER — LISINOPRIL 40 MG PO TABS: 40.0000 mg | ORAL_TABLET | Freq: Every day | ORAL | 3 refills | Status: DC

## 2017-03-24 NOTE — Progress Notes (Signed)
Patient ID: Daniel Acevedo, male   DOB: [birth year], 41 y.o.   MRN: [id number]     Subjective:  I acted as a Education administrator for Dr. Carollee Herter.  Guerry Bruin, [place]   Patient ID: Daniel Acevedo, male    DOB: Jan 24, 1976, 41 y.o.   MRN: [id number]  Chief Complaint  Patient presents with  . Hypertension  . Thyroid Problem    HPI  Patient is in today for follow up blood pressure and thyroid.  He states his blood pressures have been running high home.  Patient Care Team: Carollee Herter, Alferd Apa, DO as PCP - General (Family Medicine)   Past Medical History:  Diagnosis Date  . CARPAL TUNNEL SYNDROME, BILATERAL 01/04/2010  . DEPRESSIVE DISORDER 04/27/2010  . HYPERLIPIDEMIA 01/26/2010  . Hypertension   . HYPOTHYROIDISM 03/15/2010  . THROMBOCYTOPENIA 01/12/2010    Past Surgical History:  Procedure Laterality Date  . BONE MARROW BIOPSY      Family History  Problem Relation Age of Onset  . Thyroid disease Mother        hypothyroidism  . Hypertension Mother   . Heart disease Father        cabg  . Diabetes Father   . Kidney disease Father   . Hypertension Father   . Hypertension Brother   . Heart disease Brother 18       MI  . Diabetes Brother   . Hypertension Brother   . Hyperlipidemia Brother     Social History   Social History  . Marital status: Married    Spouse name: N/A  . Number of children: N/A  . Years of education: N/A   Occupational History  .  Manager [employer]   Social History Main Topics  . Smoking status: Smoker, Current Status Unknown    Packs/day: 1.50    Years: 22.00  . Smokeless tobacco: Never Used     Comment: pt has tried many otc--- and wellbutrin and chantix with no success  . Alcohol use No  . Drug use: No  . Sexual activity: Yes    Partners: Female   Other Topics Concern  . Not on file   Social History Narrative   Regular exercise-no          Outpatient Medications Prior to Visit  Medication Sig Dispense Refill  . atorvastatin  (LIPITOR) 40 MG tablet Take 1 tablet (40 mg total) by mouth daily at 6 PM. 30 tablet 5  . escitalopram (LEXAPRO) 20 MG tablet TAKE 1 TABLET BY MOUTH EVERY DAY 30 tablet 10  . levothyroxine (SYNTHROID, LEVOTHROID) 200 MCG tablet Take 1 tablet (200 mcg total) by mouth daily. 30 tablet 8  . lisinopril (PRINIVIL,ZESTRIL) 20 MG tablet TAKE 1 TABLET BY MOUTH EVERY DAY (MAX ON INS) 90 tablet 2  . atorvastatin (LIPITOR) 40 MG tablet TAKE 1 TABLET (40 MG TOTAL) BY MOUTH DAILY AT 6 PM. 30 tablet 0  . escitalopram (LEXAPRO) 10 MG tablet Take 1 tablet (10 mg total) by mouth daily. 30 tablet 2  . levothyroxine (SYNTHROID, LEVOTHROID) 200 MCG tablet TAKE 1 TABLET BY MOUTH EVERY DAY 30 tablet 7   No facility-administered medications prior to visit.     No Known Allergies  Review of Systems  Constitutional: Negative for fever and malaise/fatigue.  HENT: Negative for congestion.   Eyes: Negative for blurred vision.  Respiratory: Negative for cough and shortness of breath.   Cardiovascular: Negative for chest pain, palpitations and leg swelling.  Gastrointestinal:  Negative for vomiting.  Musculoskeletal: Negative for back pain.  Skin: Negative for rash.  Neurological: Negative for loss of consciousness and headaches.       Objective:    Physical Exam  Constitutional: He appears well-developed and well-nourished. No distress.  HENT:  Head: Normocephalic and atraumatic.  Eyes: Conjunctivae are normal.  Neck: Normal range of motion. No thyromegaly present.  Cardiovascular: Normal rate and regular rhythm.   Pulmonary/Chest: Effort normal. He has no wheezes.  Abdominal: Soft. Bowel sounds are normal. There is no tenderness.  Musculoskeletal: Normal range of motion. He exhibits no edema or deformity.  Neurological: He is alert.  Skin: Skin is warm and dry. He is not diaphoretic.  Psychiatric: He has a normal mood and affect.    BP 130/80 (BP Location: Left Arm, Cuff Size: Large)   Pulse 90    Temp 98.1 F (36.7 C) (Oral)   Resp 16   Ht '5\' 9"'  (1.753 m)   Wt (!) 306 lb 3.2 oz (138.9 kg)   SpO2 97%   BMI 45.22 kg/m  Wt Readings from Last 3 Encounters:  03/24/17 (!) 306 lb 3.2 oz (138.9 kg)  06/23/16 289 lb (131.1 kg)  05/23/16 285 lb (129.3 kg)   BP Readings from Last 3 Encounters:  03/24/17 130/80  06/23/16 128/78  05/23/16 106/70      There is no immunization history on file for this patient.  Health Maintenance  Topic Date Due  . INFLUENZA VACCINE  04/26/2017  . TETANUS/TDAP  11/09/2021  . HIV Screening  Completed    Lab Results  Component Value Date   WBC 9.6 03/24/2017   HGB 15.2 03/24/2017   HCT 44.3 03/24/2017   PLT 73 (L) 03/24/2017   GLUCOSE 81 03/24/2017   CHOL 193 03/24/2017   TRIG 418 (H) 03/24/2017   HDL 27 (L) 03/24/2017   LDLDIRECT 141.4 08/01/2013   LDLCALC NOT CALC 03/24/2017   ALT 30 03/24/2017   AST 25 03/24/2017   NA 143 03/24/2017   K 4.3 03/24/2017   CL 105 03/24/2017   CREATININE 1.07 03/24/2017   BUN 14 03/24/2017   CO2 24 03/24/2017   TSH 20.20 (H) 03/24/2017   MICROALBUR 1.4 10/10/2014    Lab Results  Component Value Date   TSH 20.20 (H) 03/24/2017   Lab Results  Component Value Date   WBC 9.6 03/24/2017   HGB 15.2 03/24/2017   HCT 44.3 03/24/2017   MCV 86.4 03/24/2017   PLT 73 (L) 03/24/2017   Lab Results  Component Value Date   NA 143 03/24/2017   K 4.3 03/24/2017   CO2 24 03/24/2017   GLUCOSE 81 03/24/2017   BUN 14 03/24/2017   CREATININE 1.07 03/24/2017   BILITOT 0.6 03/24/2017   ALKPHOS 131 (H) 03/24/2017   AST 25 03/24/2017   ALT 30 03/24/2017   PROT 6.5 03/24/2017   ALBUMIN 4.3 03/24/2017   CALCIUM 9.5 03/24/2017   GFR 94.40 05/23/2016   Lab Results  Component Value Date   CHOL 193 03/24/2017   Lab Results  Component Value Date   HDL 27 (L) 03/24/2017   Lab Results  Component Value Date   LDLCALC NOT CALC 03/24/2017   Lab Results  Component Value Date   TRIG 418 (H) 03/24/2017    Lab Results  Component Value Date   CHOLHDL 7.1 (H) 03/24/2017   No results found for: HGBA1C       Assessment & Plan:   Problem List  Items Addressed This Visit      Unprioritized   HTN (hypertension)    Well controlled, no changes to meds. Encouraged heart healthy diet such as the DASH diet and exercise as tolerated.       Relevant Medications   lisinopril (PRINIVIL,ZESTRIL) 40 MG tablet   Other Relevant Orders   POCT Urinalysis Dipstick (Automated) (Completed)   Comp Met (CMET) (Completed)   CBC w/Diff (Completed)   Hyperlipidemia    Tolerating statin, encouraged heart healthy diet, avoid trans fats, minimize simple carbs and saturated fats. Increase exercise as tolerated      Relevant Medications   lisinopril (PRINIVIL,ZESTRIL) 40 MG tablet    Other Visit Diagnoses    Hypothyroidism, unspecified type    -  Primary   Relevant Orders   TSH (Completed)   T4, free (Completed)   T3, free (Completed)   Hyperlipidemia LDL goal <100       Relevant Medications   lisinopril (PRINIVIL,ZESTRIL) 40 MG tablet   Other Relevant Orders   POCT Urinalysis Dipstick (Automated) (Completed)   Lipid panel (Completed)      I have discontinued Mr. Coupland's lisinopril. I am also having him start on lisinopril. Additionally, I am having him maintain his levothyroxine, atorvastatin, and escitalopram.  Meds ordered this encounter  Medications  . lisinopril (PRINIVIL,ZESTRIL) 40 MG tablet    Sig: Take 1 tablet (40 mg total) by mouth daily.    Dispense:  90 tablet    Refill:  3    CMA served as scribe during this visit. History, Physical and Plan performed by medical provider. Documentation and orders reviewed and attested to.  Ann Held, DO

## 2017-03-24 NOTE — Patient Instructions (Addendum)
DASH Eating Plan DASH stands for "Dietary Approaches to Stop Hypertension." The DASH eating plan is a healthy eating plan that has been shown to reduce high blood pressure (hypertension). It may also reduce your risk for type 2 diabetes, heart disease, and stroke. The DASH eating plan may also help with weight loss. What are tips for following this plan? General guidelines  Avoid eating more than 2,300 mg (milligrams) of salt (sodium) a day. If you have hypertension, you may need to reduce your sodium intake to 1,500 mg a day.  Limit alcohol intake to no more than 1 drink a day for nonpregnant women and 2 drinks a day for men. One drink equals 12 oz of beer, 5 oz of wine, or 1 oz of hard liquor.  Work with your health care provider to maintain a healthy body weight or to lose weight. Ask what an ideal weight is for you.  Get at least 30 minutes of exercise that causes your heart to beat faster (aerobic exercise) most days of the week. Activities may include walking, swimming, or biking.  Work with your health care provider or diet and nutrition specialist (dietitian) to adjust your eating plan to your individual calorie needs. Reading food labels  Check food labels for the amount of sodium per serving. Choose foods with less than 5 percent of the Daily Value of sodium. Generally, foods with less than 300 mg of sodium per serving fit into this eating plan.  To find whole grains, look for the word "whole" as the first word in the ingredient list. Shopping  Buy products labeled as "low-sodium" or "no salt added."  Buy fresh foods. Avoid canned foods and premade or frozen meals. Cooking  Avoid adding salt when cooking. Use salt-free seasonings or herbs instead of table salt or sea salt. Check with your health care provider or pharmacist before using salt substitutes.  Do not fry foods. Cook foods using healthy methods such as baking, boiling, grilling, and broiling instead.  Cook with  heart-healthy oils, such as olive, canola, soybean, or sunflower oil. Meal planning   Eat a balanced diet that includes: ? 5 or more servings of fruits and vegetables each day. At each meal, try to fill half of your plate with fruits and vegetables. ? Up to 6-8 servings of whole grains each day. ? Less than 6 oz of lean meat, poultry, or fish each day. A 3-oz serving of meat is about the same size as a deck of cards. One egg equals 1 oz. ? 2 servings of low-fat dairy each day. ? A serving of nuts, seeds, or beans 5 times each week. ? Heart-healthy fats. Healthy fats called Omega-3 fatty acids are found in foods such as flaxseeds and coldwater fish, like sardines, salmon, and mackerel.  Limit how much you eat of the following: ? Canned or prepackaged foods. ? Food that is high in trans fat, such as fried foods. ? Food that is high in saturated fat, such as fatty meat. ? Sweets, desserts, sugary drinks, and other foods with added sugar. ? Full-fat dairy products.  Do not salt foods before eating.  Try to eat at least 2 vegetarian meals each week.  Eat more home-cooked food and less restaurant, buffet, and fast food.  When eating at a restaurant, ask that your food be prepared with less salt or no salt, if possible. What foods are recommended? The items listed may not be a complete list. Talk with your dietitian about what   dietary choices are best for you. Grains Whole-grain or whole-wheat bread. Whole-grain or whole-wheat pasta. Brown rice. Oatmeal. Quinoa. Bulgur. Whole-grain and low-sodium cereals. Pita bread. Low-fat, low-sodium crackers. Whole-wheat flour tortillas. Vegetables Fresh or frozen vegetables (raw, steamed, roasted, or grilled). Low-sodium or reduced-sodium tomato and vegetable juice. Low-sodium or reduced-sodium tomato sauce and tomato paste. Low-sodium or reduced-sodium canned vegetables. Fruits All fresh, dried, or frozen fruit. Canned fruit in natural juice (without  added sugar). Meat and other protein foods Skinless chicken or turkey. Ground chicken or turkey. Pork with fat trimmed off. Fish and seafood. Egg whites. Dried beans, peas, or lentils. Unsalted nuts, nut butters, and seeds. Unsalted canned beans. Lean cuts of beef with fat trimmed off. Low-sodium, lean deli meat. Dairy Low-fat (1%) or fat-free (skim) milk. Fat-free, low-fat, or reduced-fat cheeses. Nonfat, low-sodium ricotta or cottage cheese. Low-fat or nonfat yogurt. Low-fat, low-sodium cheese. Fats and oils Soft margarine without trans fats. Vegetable oil. Low-fat, reduced-fat, or light mayonnaise and salad dressings (reduced-sodium). Canola, safflower, olive, soybean, and sunflower oils. Avocado. Seasoning and other foods Herbs. Spices. Seasoning mixes without salt. Unsalted popcorn and pretzels. Fat-free sweets. What foods are not recommended? The items listed may not be a complete list. Talk with your dietitian about what dietary choices are best for you. Grains Baked goods made with fat, such as croissants, muffins, or some breads. Dry pasta or rice meal packs. Vegetables Creamed or fried vegetables. Vegetables in a cheese sauce. Regular canned vegetables (not low-sodium or reduced-sodium). Regular canned tomato sauce and paste (not low-sodium or reduced-sodium). Regular tomato and vegetable juice (not low-sodium or reduced-sodium). Pickles. Olives. Fruits Canned fruit in a light or heavy syrup. Fried fruit. Fruit in cream or butter sauce. Meat and other protein foods Fatty cuts of meat. Ribs. Fried meat. Bacon. Sausage. Bologna and other processed lunch meats. Salami. Fatback. Hotdogs. Bratwurst. Salted nuts and seeds. Canned beans with added salt. Canned or smoked fish. Whole eggs or egg yolks. Chicken or turkey with skin. Dairy Whole or 2% milk, cream, and half-and-half. Whole or full-fat cream cheese. Whole-fat or sweetened yogurt. Full-fat cheese. Nondairy creamers. Whipped toppings.  Processed cheese and cheese spreads. Fats and oils Butter. Stick margarine. Lard. Shortening. Ghee. Bacon fat. Tropical oils, such as coconut, palm kernel, or palm oil. Seasoning and other foods Salted popcorn and pretzels. Onion salt, garlic salt, seasoned salt, table salt, and sea salt. Worcestershire sauce. Tartar sauce. Barbecue sauce. Teriyaki sauce. Soy sauce, including reduced-sodium. Steak sauce. Canned and packaged gravies. Fish sauce. Oyster sauce. Cocktail sauce. Horseradish that you find on the shelf. Ketchup. Mustard. Meat flavorings and tenderizers. Bouillon cubes. Hot sauce and Tabasco sauce. Premade or packaged marinades. Premade or packaged taco seasonings. Relishes. Regular salad dressings. Where to find more information:  National Heart, Lung, and Blood Institute: www.nhlbi.nih.gov  American Heart Association: www.heart.org Summary  The DASH eating plan is a healthy eating plan that has been shown to reduce high blood pressure (hypertension). It may also reduce your risk for type 2 diabetes, heart disease, and stroke.  With the DASH eating plan, you should limit salt (sodium) intake to 2,300 mg a day. If you have hypertension, you may need to reduce your sodium intake to 1,500 mg a day.  When on the DASH eating plan, aim to eat more fresh fruits and vegetables, whole grains, lean proteins, low-fat dairy, and heart-healthy fats.  Work with your health care provider or diet and nutrition specialist (dietitian) to adjust your eating plan to your individual   calorie needs. This information is not intended to replace advice given to you by your health care provider. Make sure you discuss any questions you have with your health care provider. Document Released: 09/01/2011 Document Revised: 09/05/2016 Document Reviewed: 09/05/2016 Elsevier Interactive Patient Education  2017 [employer].   Steps to Quit Smoking Smoking tobacco can be harmful to your health and can affect almost  every organ in your body. Smoking puts you, and those around you, at risk for developing many serious chronic diseases. Quitting smoking is difficult, but it is one of the best things that you can do for your health. It is never too late to quit. What are the benefits of quitting smoking? When you quit smoking, you lower your risk of developing serious diseases and conditions, such as:  Lung cancer or lung disease, such as COPD.  Heart disease.  Stroke.  Heart attack.  Infertility.  Osteoporosis and bone fractures.  Additionally, symptoms such as coughing, wheezing, and shortness of breath may get better when you quit. You may also find that you get sick less often because your body is stronger at fighting off colds and infections. If you are pregnant, quitting smoking can help to reduce your chances of having a baby of low birth weight. How do I get ready to quit? When you decide to quit smoking, create a plan to make sure that you are successful. Before you quit:  Pick a date to quit. Set a date within the next two weeks to give you time to prepare.  Write down the reasons why you are quitting. Keep this list in places where you will see it often, such as on your bathroom mirror or in your car or wallet.  Identify the people, places, things, and activities that make you want to smoke (triggers) and avoid them. Make sure to take these actions: ? Throw away all cigarettes at home, at work, and in your car. ? Throw away smoking accessories, such as Set designer. ? Clean your car and make sure to empty the ashtray. ? Clean your home, including curtains and carpets.  Tell your family, friends, and coworkers that you are quitting. Support from your loved ones can make quitting easier.  Talk with your health care provider about your options for quitting smoking.  Find out what treatment options are covered by your health insurance.  What strategies can I use to quit  smoking? Talk with your healthcare provider about different strategies to quit smoking. Some strategies include:  Quitting smoking altogether instead of gradually lessening how much you smoke over a period of time. Research shows that quitting "cold [place]" is more successful than gradually quitting.  Attending in-person counseling to help you build problem-solving skills. You are more likely to have success in quitting if you attend several counseling sessions. Even short sessions of 10 minutes can be effective.  Finding resources and support systems that can help you to quit smoking and remain smoke-free after you quit. These resources are most helpful when you use them often. They can include: ? Online chats with a Veterinary surgeon. ? Telephone quitlines. ? Automotive engineer. ? Support groups or group counseling. ? Text messaging programs. ? Mobile phone applications.  Taking medicines to help you quit smoking. (If you are pregnant or breastfeeding, talk with your health care provider first.) Some medicines contain nicotine and some do not. Both types of medicines help with cravings, but the medicines that include nicotine help to relieve withdrawal symptoms.  Your health care provider may recommend: ? Nicotine patches, gum, or lozenges. ? Nicotine inhalers or sprays. ? Non-nicotine medicine that is taken by mouth.  Talk with your health care provider about combining strategies, such as taking medicines while you are also receiving in-person counseling. Using these two strategies together makes you more likely to succeed in quitting than if you used either strategy on its own. If you are pregnant or breastfeeding, talk with your health care provider about finding counseling or other support strategies to quit smoking. Do not take medicine to help you quit smoking unless told to do so by your health care provider. What things can I do to make it easier to quit? Quitting smoking might feel  overwhelming at first, but there is a lot that you can do to make it easier. Take these important actions:  Reach out to your family and friends and ask that they support and encourage you during this time. Call telephone quitlines, reach out to support groups, or work with a counselor for support.  Ask people who smoke to avoid smoking around you.  Avoid places that trigger you to smoke, such as bars, parties, or smoke-break areas at work.  Spend time around people who do not smoke.  Lessen stress in your life, because stress can be a smoking trigger for some people. To lessen stress, try: ? Exercising regularly. ? Deep-breathing exercises. ? Yoga. ? Meditating. ? Performing a body scan. This involves closing your eyes, scanning your body from head to toe, and noticing which parts of your body are particularly tense. Purposefully relax the muscles in those areas.  Download or purchase mobile phone or tablet apps (applications) that can help you stick to your quit plan by providing reminders, tips, and encouragement. There are many free apps, such as QuitGuide from the [employer] developer(Centers for Disease Control and Prevention). You can find other support for quitting smoking (smoking cessation) through smokefree.gov and other websites.  How will I feel when I quit smoking? Within the first 24 hours of quitting smoking, you may start to feel some withdrawal symptoms. These symptoms are usually most noticeable 2-3 days after quitting, but they usually do not last beyond 2-3 weeks. Changes or symptoms that you might experience include:  Mood swings.  Restlessness, anxiety, or irritation.  Difficulty concentrating.  Dizziness.  Strong cravings for sugary foods in addition to nicotine.  Mild weight gain.  Constipation.  Nausea.  Coughing or a sore throat.  Changes in how your medicines work in your body.  A depressed mood.  Difficulty sleeping (insomnia).  After the first 2-3 weeks of  quitting, you may start to notice more positive results, such as:  Improved sense of smell and taste.  Decreased coughing and sore throat.  Slower heart rate.  Lower blood pressure.  Clearer skin.  The ability to breathe more easily.  Fewer sick days.  Quitting smoking is very challenging for most people. Do not get discouraged if you are not successful the first time. Some people need to make many attempts to quit before they achieve long-term success. Do your best to stick to your quit plan, and talk with your health care provider if you have any questions or concerns. This information is not intended to replace advice given to you by your health care provider. Make sure you discuss any questions you have with your health care provider. Document Released: 09/06/2001 Document Revised: 05/10/2016 Document Reviewed: 01/27/2015 Elsevier Interactive Patient Education  2017 Elsevier  Inc.  

## 2017-03-25 LAB — COMPREHENSIVE METABOLIC PANEL
ALK PHOS: 131 U/L — AB (ref 40–115)
ALT: 30 U/L (ref 9–46)
AST: 25 U/L (ref 10–40)
Albumin: 4.3 g/dL (ref 3.6–5.1)
BUN: 14 mg/dL (ref 7–25)
CALCIUM: 9.5 mg/dL (ref 8.6–10.3)
CO2: 24 mmol/L (ref 20–31)
Chloride: 105 mmol/L (ref 98–110)
Creat: 1.07 mg/dL (ref 0.60–1.35)
GLUCOSE: 81 mg/dL (ref 65–99)
POTASSIUM: 4.3 mmol/L (ref 3.5–5.3)
Sodium: 143 mmol/L (ref 135–146)
Total Bilirubin: 0.6 mg/dL (ref 0.2–1.2)
Total Protein: 6.5 g/dL (ref 6.1–8.1)

## 2017-03-25 LAB — LIPID PANEL
CHOL/HDL RATIO: 7.1 ratio — AB (ref <5.0)
CHOLESTEROL: 193 mg/dL (ref <200)
HDL: 27 mg/dL — ABNORMAL LOW (ref >40)
TRIGLYCERIDES: 418 mg/dL — AB (ref <150)

## 2017-03-26 NOTE — Assessment & Plan Note (Signed)
Well controlled, no changes to meds. Encouraged heart healthy diet such as the DASH diet and exercise as tolerated.  °

## 2017-03-26 NOTE — Assessment & Plan Note (Signed)
Tolerating statin, encouraged heart healthy diet, avoid trans fats, minimize simple carbs and saturated fats. Increase exercise as tolerated 

## 2017-03-27 ENCOUNTER — Encounter: Payer: Self-pay | Admitting: Family Medicine

## 2017-03-28 ENCOUNTER — Other Ambulatory Visit: Payer: Self-pay | Admitting: Family Medicine

## 2017-03-28 DIAGNOSIS — E785 Hyperlipidemia, unspecified: Secondary | ICD-10-CM

## 2017-03-28 DIAGNOSIS — E039 Hypothyroidism, unspecified: Secondary | ICD-10-CM

## 2017-03-28 MED ORDER — FENOFIBRATE 160 MG PO TABS: 160.0000 mg | ORAL_TABLET | Freq: Every day | ORAL | 2 refills | Status: DC

## 2017-03-28 NOTE — Telephone Encounter (Signed)
Pt has been scheduled.  °

## 2017-03-28 NOTE — Telephone Encounter (Signed)
LVM advising patient of message below °

## 2017-04-03 ENCOUNTER — Telehealth: Payer: Self-pay | Admitting: General Practice

## 2017-04-03 NOTE — Telephone Encounter (Signed)
Please call patient @ mobile anytime to schedule a np appointment per referral

## 2017-04-21 ENCOUNTER — Other Ambulatory Visit: Payer: Self-pay | Admitting: Family Medicine

## 2017-04-21 DIAGNOSIS — E785 Hyperlipidemia, unspecified: Secondary | ICD-10-CM

## 2017-04-21 MED ORDER — ATORVASTATIN CALCIUM 40 MG PO TABS: 40.0000 mg | ORAL_TABLET | Freq: Every day | ORAL | 5 refills | Status: DC

## 2017-06-18 ENCOUNTER — Other Ambulatory Visit: Payer: Self-pay | Admitting: Family Medicine

## 2017-06-23 ENCOUNTER — Ambulatory Visit: Payer: BLUE CROSS/BLUE SHIELD | Admitting: Hematology & Oncology

## 2017-06-23 ENCOUNTER — Other Ambulatory Visit: Payer: BLUE CROSS/BLUE SHIELD

## 2017-06-29 ENCOUNTER — Ambulatory Visit (INDEPENDENT_AMBULATORY_CARE_PROVIDER_SITE_OTHER): Payer: BLUE CROSS/BLUE SHIELD | Admitting: Family Medicine

## 2017-06-29 ENCOUNTER — Encounter: Payer: Self-pay | Admitting: Family Medicine

## 2017-06-29 VITALS — BP 116/74 | HR 79 | Temp 97.8°F | Ht 69.0 in | Wt 299.6 lb

## 2017-06-29 DIAGNOSIS — I10 Essential (primary) hypertension: Secondary | ICD-10-CM | POA: Diagnosis not present

## 2017-06-29 DIAGNOSIS — E785 Hyperlipidemia, unspecified: Secondary | ICD-10-CM | POA: Diagnosis not present

## 2017-06-29 DIAGNOSIS — D696 Thrombocytopenia, unspecified: Secondary | ICD-10-CM | POA: Diagnosis not present

## 2017-06-29 DIAGNOSIS — F17200 Nicotine dependence, unspecified, uncomplicated: Secondary | ICD-10-CM | POA: Diagnosis not present

## 2017-06-29 DIAGNOSIS — Z0000 Encounter for general adult medical examination without abnormal findings: Secondary | ICD-10-CM | POA: Diagnosis not present

## 2017-06-29 DIAGNOSIS — E039 Hypothyroidism, unspecified: Secondary | ICD-10-CM | POA: Diagnosis not present

## 2017-06-29 LAB — CBC WITH DIFFERENTIAL/PLATELET
BASOS ABS: 0 10*3/uL (ref 0.0–0.1)
Basophils Relative: 0.2 % (ref 0.0–3.0)
Eosinophils Absolute: 0.2 10*3/uL (ref 0.0–0.7)
Eosinophils Relative: 2.2 % (ref 0.0–5.0)
HCT: 45.5 % (ref 39.0–52.0)
Hemoglobin: 15.6 g/dL (ref 13.0–17.0)
Lymphocytes Relative: 19.3 % (ref 12.0–46.0)
Lymphs Abs: 1.7 10*3/uL (ref 0.7–4.0)
MCHC: 34.3 g/dL (ref 30.0–36.0)
MCV: 87 fl (ref 78.0–100.0)
MONO ABS: 0.6 10*3/uL (ref 0.1–1.0)
MONOS PCT: 7.6 % (ref 3.0–12.0)
NEUTROS ABS: 6 10*3/uL (ref 1.4–7.7)
NEUTROS PCT: 70.7 % (ref 43.0–77.0)
PLATELETS: 60 10*3/uL — AB (ref 150.0–400.0)
RBC: 5.23 Mil/uL (ref 4.22–5.81)
RDW: 13.7 % (ref 11.5–15.5)
WBC: 8.5 10*3/uL (ref 4.0–10.5)

## 2017-06-29 LAB — COMPREHENSIVE METABOLIC PANEL
ALT: 23 U/L (ref 0–53)
AST: 19 U/L (ref 0–37)
Albumin: 4.4 g/dL (ref 3.5–5.2)
Alkaline Phosphatase: 132 U/L — ABNORMAL HIGH (ref 39–117)
BUN: 17 mg/dL (ref 6–23)
CO2: 28 meq/L (ref 19–32)
Calcium: 9.9 mg/dL (ref 8.4–10.5)
Chloride: 104 mEq/L (ref 96–112)
Creatinine, Ser: 1.01 mg/dL (ref 0.40–1.50)
GFR: 87.45 mL/min (ref >60.00)
GLUCOSE: 105 mg/dL — AB (ref 70–99)
POTASSIUM: 4.5 meq/L (ref 3.5–5.1)
SODIUM: 140 meq/L (ref 135–145)
Total Bilirubin: 1.1 mg/dL (ref 0.2–1.2)
Total Protein: 7.2 g/dL (ref 6.0–8.3)

## 2017-06-29 LAB — POC URINALSYSI DIPSTICK (AUTOMATED)
BILIRUBIN UA: NEGATIVE
Blood, UA: NEGATIVE
GLUCOSE UA: NEGATIVE
Ketones, UA: NEGATIVE
Leukocytes, UA: NEGATIVE
NITRITE UA: NEGATIVE
Protein, UA: NEGATIVE
Spec Grav, UA: >=1.03 — AB (ref 1.010–1.025)
UROBILINOGEN UA: 0.2 U/dL
pH, UA: 6 (ref 5.0–8.0)

## 2017-06-29 LAB — LIPID PANEL
CHOL/HDL RATIO: 5
Cholesterol: 149 mg/dL (ref 0–200)
HDL: 27.8 mg/dL — AB (ref >39.00)
LDL Cholesterol: 82 mg/dL (ref 0–99)
NONHDL: 121.21
TRIGLYCERIDES: 197 mg/dL — AB (ref 0.0–149.0)
VLDL: 39.4 mg/dL (ref 0.0–40.0)

## 2017-06-29 LAB — TSH: TSH: 0.7 u[IU]/mL (ref 0.35–4.50)

## 2017-06-29 NOTE — Assessment & Plan Note (Signed)
Per hematology 

## 2017-06-29 NOTE — Progress Notes (Signed)
Patient ID: Daniel Acevedo, male    DOB: 18-Nov-1975  Age: 41 y.o. MRN: [id number]    Subjective:  Subjective  HPI Daniel Acevedo presents for cpe -- no complaints.   Review of Systems  Constitutional: Negative.   HENT: Negative for congestion, ear pain, hearing loss, nosebleeds, postnasal drip, rhinorrhea, sinus pressure, sneezing and tinnitus.   Eyes: Negative for photophobia, discharge, itching and visual disturbance.  Respiratory: Negative.   Cardiovascular: Negative.   Gastrointestinal: Negative for abdominal distention, abdominal pain, anal bleeding, blood in stool and constipation.  Endocrine: Negative.   Genitourinary: Negative.   Musculoskeletal: Negative.   Skin: Negative.   Allergic/Immunologic: Negative.   Neurological: Negative for dizziness, weakness, light-headedness, numbness and headaches.  Psychiatric/Behavioral: Negative for agitation, confusion, decreased concentration, dysphoric mood, sleep disturbance and suicidal ideas. The patient is not nervous/anxious.     History Past Medical History:  Diagnosis Date  . CARPAL TUNNEL SYNDROME, BILATERAL 01/04/2010  . DEPRESSIVE DISORDER 04/27/2010  . HYPERLIPIDEMIA 01/26/2010  . Hypertension   . HYPOTHYROIDISM 03/15/2010  . THROMBOCYTOPENIA 01/12/2010    He has a past surgical history that includes Bone marrow biopsy.   His family history includes Diabetes in his brother and father; Heart disease in his father; Heart disease (age of onset: 53) in his brother; Hyperlipidemia in his brother; Hypertension in his brother, brother, father, and mother; Kidney disease in his father; Thyroid disease in his mother.He reports that he has been smoking.  He has a 33.00 pack-year smoking history. He has never used smokeless tobacco. He reports that he does not drink alcohol or use drugs.  Current Outpatient Prescriptions on File Prior to Visit  Medication Sig Dispense Refill  . atorvastatin (LIPITOR) 40 MG tablet Take 1 tablet (40 mg  total) by mouth daily at 6 PM. 30 tablet 5  . escitalopram (LEXAPRO) 20 MG tablet TAKE 1 TABLET BY MOUTH EVERY DAY 30 tablet 10  . fenofibrate 160 MG tablet TAKE 1 TABLET BY MOUTH EVERY DAY 30 tablet 1  . levothyroxine (SYNTHROID, LEVOTHROID) 200 MCG tablet Take 1 tablet (200 mcg total) by mouth daily. 30 tablet 8  . lisinopril (PRINIVIL,ZESTRIL) 40 MG tablet Take 1 tablet (40 mg total) by mouth daily. 90 tablet 3   No current facility-administered medications on file prior to visit.      Objective:  Objective  Physical Exam  Constitutional: He is oriented to person, place, and time. He appears well-developed and well-nourished. No distress.  HENT:  Head: Normocephalic and atraumatic.  Right Ear: External ear normal.  Left Ear: External ear normal.  Nose: Nose normal.  Mouth/Throat: Oropharynx is clear and moist. No oropharyngeal exudate.  Eyes: Pupils are equal, round, and reactive to light. Conjunctivae and EOM are normal. Right eye exhibits no discharge. Left eye exhibits no discharge.  Neck: Normal range of motion. Neck supple. No JVD present. No thyromegaly present.  Cardiovascular: Normal rate, regular rhythm and intact distal pulses.  Exam reveals no gallop and no friction rub.   No murmur heard. Pulmonary/Chest: Effort normal and breath sounds normal. No respiratory distress. He has no wheezes. He has no rales. He exhibits no tenderness.  Abdominal: Soft. Bowel sounds are normal. He exhibits no distension and no mass. There is no tenderness. There is no rebound and no guarding.  Musculoskeletal: Normal range of motion. He exhibits no edema or tenderness.  Lymphadenopathy:    He has no cervical adenopathy.  Neurological: He is alert and oriented to person, place, and time.  He displays normal reflexes. He exhibits normal muscle tone.  Skin: Skin is warm and dry. No rash noted. He is not diaphoretic. No erythema. No pallor.  Psychiatric: He has a normal mood and affect. His  behavior is normal. Judgment and thought content normal.  Nursing note and vitals reviewed.  BP 116/74 (BP Location: Left Arm, Patient Position: Sitting, Cuff Size: Large)   Pulse 79   Temp 97.8 F (36.6 C) (Oral)   Ht _0  (1.753 m)   Wt 299 lb 9.6 oz (135.9 kg)   SpO2 96%   BMI 44.24 kg/m  Wt Readings from Last 3 Encounters:  06/29/17 299 lb 9.6 oz (135.9 kg)  03/24/17 (!) 306 lb 3.2 oz (138.9 kg)  06/23/16 289 lb (131.1 kg)     Lab Results  Component Value Date   WBC 8.5 06/29/2017   HGB 15.6 06/29/2017   HCT 45.5 06/29/2017   PLT 60.0 (L) 06/29/2017   GLUCOSE 105 (H) 06/29/2017   CHOL 149 06/29/2017   TRIG 197.0 (H) 06/29/2017   HDL 27.80 (L) 06/29/2017   LDLDIRECT 141.4 08/01/2013   LDLCALC 82 06/29/2017   ALT 23 06/29/2017   AST 19 06/29/2017   NA 140 06/29/2017   K 4.5 06/29/2017   CL 104 06/29/2017   CREATININE 1.01 06/29/2017   BUN 17 06/29/2017   CO2 28 06/29/2017   TSH 0.70 06/29/2017   MICROALBUR 1.4 10/10/2014    Dg Chest 2 View  Result Date: 12/02/2013 CLINICAL DATA:  Shortness of breath EXAM: CHEST  2 VIEW COMPARISON:  None. FINDINGS: Normal heart size and vascularity. Low lung volumes without pneumonia, edema, effusion, or pneumothorax. Trachea midline. IMPRESSION: Low volume chest exam without acute process Electronically Signed   By: Daryll Brod M.D.   On: 12/02/2013 17:11     Assessment & Plan:  Plan  I am having Daniel Acevedo maintain his levothyroxine, escitalopram, lisinopril, atorvastatin, and fenofibrate.  No orders of the defined types were placed in this encounter.   Problem List Items Addressed This Visit      Unprioritized   HTN (hypertension)    Well controlled, no changes to meds. Encouraged heart healthy diet such as the DASH diet and exercise as tolerated.       Hyperlipidemia    Tolerating statin, encouraged heart healthy diet, avoid trans fats, minimize simple carbs and saturated fats. Increase exercise as tolerated        Severe obesity (BMI >= 40) (HCC)    Discuss diet and exercise Gave pt info for healthy weight and wellness      THROMBOCYTOPENIA    Per hematology      Relevant Orders   CBC with Differential/Platelet (Completed)   TOBACCO USE    Pt does not want to quick Pt understands risks       Preventative health care - Primary    See AVS Check labs ghm utd      Relevant Orders   Lipid panel (Completed)   CBC with Differential/Platelet (Completed)   Comprehensive metabolic panel (Completed)   TSH (Completed)   POCT Urinalysis Dipstick (Automated) (Completed)    Other Visit Diagnoses    Hypothyroidism, unspecified type       Relevant Orders   TSH (Completed)   Hyperlipidemia LDL goal <100       Relevant Orders   Lipid panel (Completed)   CBC with Differential/Platelet (Completed)   Current every day smoker  Follow-up: Return in about 6 months (around 12/28/2017), or if symptoms worsen or fail to improve, for hypertension, hyperlipidemia.  Ann Held, DO

## 2017-06-29 NOTE — Assessment & Plan Note (Signed)
Well controlled, no changes to meds. Encouraged heart healthy diet such as the DASH diet and exercise as tolerated.  °

## 2017-06-29 NOTE — Assessment & Plan Note (Signed)
Pt does not want to quick Pt understands risks

## 2017-06-29 NOTE — Assessment & Plan Note (Signed)
Tolerating statin, encouraged heart healthy diet, avoid trans fats, minimize simple carbs and saturated fats. Increase exercise as tolerated 

## 2017-06-29 NOTE — Patient Instructions (Signed)
Preventive Care 18-39 Years, Male Preventive care refers to lifestyle choices and visits with your health care provider that can promote health and wellness. What does preventive care include?  A yearly physical exam. This is also called an annual well check.  Dental exams once or twice a year.  Routine eye exams. Ask your health care provider how often you should have your eyes checked.  Personal lifestyle choices, including: ? Daily care of your teeth and gums. ? Regular physical activity. ? Eating a healthy diet. ? Avoiding tobacco and drug use. ? Limiting alcohol use. ? Practicing safe sex. What happens during an annual well check? The services and screenings done by your health care provider during your annual well check will depend on your age, overall health, lifestyle risk factors, and family history of disease. Counseling Your health care provider may ask you questions about your:  Alcohol use.  Tobacco use.  Drug use.  Emotional well-being.  Home and relationship well-being.  Sexual activity.  Eating habits.  Work and work environment.  Screening You may have the following tests or measurements:  Height, weight, and BMI.  Blood pressure.  Lipid and cholesterol levels. These may be checked every 5 years starting at age 20.  Diabetes screening. This is done by checking your blood sugar (glucose) after you have not eaten for a while (fasting).  Skin check.  Hepatitis C blood test.  Hepatitis B blood test.  Sexually transmitted disease (STD) testing.  Discuss your test results, treatment options, and if necessary, the need for more tests with your health care provider. Vaccines Your health care provider may recommend certain vaccines, such as:  Influenza vaccine. This is recommended every year.  Tetanus, diphtheria, and acellular pertussis (Tdap, Td) vaccine. You may need a Td booster every 10 years.  Varicella vaccine. You may need this if you  have not been vaccinated.  HPV vaccine. If you are 26 or younger, you may need three doses over 6 months.  Measles, mumps, and rubella (MMR) vaccine. You may need at least one dose of MMR.You may also need a second dose.  Pneumococcal 13-valent conjugate (PCV13) vaccine. You may need this if you have certain conditions and have not been vaccinated.  Pneumococcal polysaccharide (PPSV23) vaccine. You may need one or two doses if you smoke cigarettes or if you have certain conditions.  Meningococcal vaccine. One dose is recommended if you are age 19-21 years and a first-year college student living in a residence hall, or if you have one of several medical conditions. You may also need additional booster doses.  Hepatitis A vaccine. You may need this if you have certain conditions or if you travel or work in places where you may be exposed to hepatitis A.  Hepatitis B vaccine. You may need this if you have certain conditions or if you travel or work in places where you may be exposed to hepatitis B.  Haemophilus influenzae type b (Hib) vaccine. You may need this if you have certain risk factors.  Talk to your health care provider about which screenings and vaccines you need and how often you need them. This information is not intended to replace advice given to you by your health care provider. Make sure you discuss any questions you have with your health care provider. Document Released: 11/08/2001 Document Revised: 06/01/2016 Document Reviewed: 07/14/2015 Elsevier Interactive Patient Education  2017 Elsevier Inc[employer]  

## 2017-06-29 NOTE — Assessment & Plan Note (Signed)
Discuss diet and exercise Gave pt info for healthy weight and wellness

## 2017-07-02 DIAGNOSIS — Z0000 Encounter for general adult medical examination without abnormal findings: Secondary | ICD-10-CM | POA: Insufficient documentation

## 2017-07-02 NOTE — Assessment & Plan Note (Signed)
See AVS Check labs  ghm utd 

## 2017-09-10 ENCOUNTER — Other Ambulatory Visit: Payer: Self-pay | Admitting: Family Medicine

## 2017-12-18 ENCOUNTER — Other Ambulatory Visit: Payer: Self-pay | Admitting: Family Medicine

## 2017-12-18 DIAGNOSIS — E785 Hyperlipidemia, unspecified: Secondary | ICD-10-CM

## 2018-04-06 ENCOUNTER — Ambulatory Visit: Payer: BLUE CROSS/BLUE SHIELD | Admitting: Family Medicine

## 2018-04-06 ENCOUNTER — Encounter: Payer: Self-pay | Admitting: Family Medicine

## 2018-04-06 VITALS — BP 140/96 | HR 87 | Temp 98.2°F | Resp 18 | Ht 69.0 in | Wt 288.0 lb

## 2018-04-06 DIAGNOSIS — E039 Hypothyroidism, unspecified: Secondary | ICD-10-CM | POA: Diagnosis not present

## 2018-04-06 DIAGNOSIS — E785 Hyperlipidemia, unspecified: Secondary | ICD-10-CM | POA: Diagnosis not present

## 2018-04-06 DIAGNOSIS — R42 Dizziness and giddiness: Secondary | ICD-10-CM | POA: Diagnosis not present

## 2018-04-06 DIAGNOSIS — R0683 Snoring: Secondary | ICD-10-CM

## 2018-04-06 DIAGNOSIS — I10 Essential (primary) hypertension: Secondary | ICD-10-CM | POA: Diagnosis not present

## 2018-04-06 MED ORDER — AMLODIPINE BESYLATE 5 MG PO TABS: 5.0000 mg | ORAL_TABLET | Freq: Every day | ORAL | 1 refills | Status: DC

## 2018-04-06 NOTE — Patient Instructions (Signed)

## 2018-04-06 NOTE — Progress Notes (Signed)
Patient ID: Daniel Acevedo, male    DOB: 07-03-1976  Age: 42 y.o. MRN: [id number]    Subjective:  Subjective  HPI Yecheskel Kurek presents for headache, dizzy,   Review of Systems  Constitutional: Negative for fever.  HENT: Negative for congestion.   Respiratory: Negative for shortness of breath.   Cardiovascular: Negative for chest pain, palpitations and leg swelling.  Gastrointestinal: Negative for abdominal pain, blood in stool and nausea.  Genitourinary: Negative for dysuria and frequency.  Skin: Negative for rash.  Allergic/Immunologic: Negative for environmental allergies.  Neurological: Negative for dizziness and headaches.  Psychiatric/Behavioral: The patient is not nervous/anxious.     History Past Medical History:  Diagnosis Date  . CARPAL TUNNEL SYNDROME, BILATERAL 01/04/2010  . DEPRESSIVE DISORDER 04/27/2010  . HYPERLIPIDEMIA 01/26/2010  . Hypertension   . HYPOTHYROIDISM 03/15/2010  . THROMBOCYTOPENIA 01/12/2010    He has a past surgical history that includes Bone marrow biopsy.   His family history includes Diabetes in his brother and father; Heart disease in his father; Heart disease (age of onset: 75) in his brother; Hyperlipidemia in his brother; Hypertension in his brother, brother, father, and mother; Kidney disease in his father; Thyroid disease in his mother.He reports that he has been smoking.  He has a 33.00 pack-year smoking history. He has never used smokeless tobacco. He reports that he does not drink alcohol or use drugs.  Current Outpatient Medications on File Prior to Visit  Medication Sig Dispense Refill  . atorvastatin (LIPITOR) 40 MG tablet TAKE 1 TABLET (40 MG TOTAL) BY MOUTH DAILY AT 6 PM. 30 tablet 5  . escitalopram (LEXAPRO) 20 MG tablet TAKE 1 TABLET BY MOUTH EVERY DAY 30 tablet 10  . fenofibrate 160 MG tablet TAKE 1 TABLET BY MOUTH EVERY DAY 30 tablet 1  . levothyroxine (SYNTHROID, LEVOTHROID) 200 MCG tablet Take 1 tablet (200 mcg total) by mouth  daily. 30 tablet 8  . lisinopril (PRINIVIL,ZESTRIL) 40 MG tablet Take 1 tablet (40 mg total) by mouth daily. 90 tablet 3   No current facility-administered medications on file prior to visit.      Objective:  Objective  Physical Exam  Constitutional: He is oriented to person, place, and time. Vital signs are normal. He appears well-developed and well-nourished. He is sleeping.  HENT:  Head: Normocephalic and atraumatic.  Mouth/Throat: Oropharynx is clear and moist.  Eyes: Pupils are equal, round, and reactive to light. EOM are normal.  Neck: Normal range of motion. Neck supple. No thyromegaly present.  Cardiovascular: Normal rate and regular rhythm.  No murmur heard. Pulmonary/Chest: Effort normal and breath sounds normal. No respiratory distress. He has no wheezes. He has no rales. He exhibits no tenderness.  Musculoskeletal: He exhibits no edema or tenderness.  Neurological: He is alert and oriented to person, place, and time.  Skin: Skin is warm and dry.  Psychiatric: He has a normal mood and affect. His behavior is normal. Judgment and thought content normal.  Nursing note and vitals reviewed.  BP (!) 140/96 (BP Location: Right Arm, Patient Position: Sitting, Cuff Size: Large)   Pulse 87   Temp 98.2 F (36.8 C) (Oral)   Resp 18   Ht '5\' 9"'  (1.753 m)   Wt 288 lb (130.6 kg)   SpO2 98%   BMI 42.53 kg/m  Wt Readings from Last 3 Encounters:  04/06/18 288 lb (130.6 kg)  06/29/17 299 lb 9.6 oz (135.9 kg)  03/24/17 (!) 306 lb 3.2 oz (138.9 kg)  Lab Results  Component Value Date   WBC 8.8 04/06/2018   HGB 15.5 04/06/2018   HCT 44.4 04/06/2018   PLT 69 (L) 04/06/2018   GLUCOSE 88 04/06/2018   CHOL 156 04/06/2018   TRIG 214 (H) 04/06/2018   HDL 29 (L) 04/06/2018   LDLDIRECT 141.4 08/01/2013   LDLCALC 96 04/06/2018   ALT 25 04/06/2018   AST 21 04/06/2018   NA 143 04/06/2018   K 4.7 04/06/2018   CL 107 04/06/2018   CREATININE 1.09 04/06/2018   BUN 15 04/06/2018    CO2 23 04/06/2018   TSH 6.72 (H) 04/06/2018   HGBA1C 5.1 04/06/2018   MICROALBUR 1.4 10/10/2014    Dg Chest 2 View  Result Date: 12/02/2013 CLINICAL DATA:  Shortness of breath EXAM: CHEST  2 VIEW COMPARISON:  None. FINDINGS: Normal heart size and vascularity. Low lung volumes without pneumonia, edema, effusion, or pneumothorax. Trachea midline. IMPRESSION: Low volume chest exam without acute process Electronically Signed   By: Daryll Brod M.D.   On: 12/02/2013 17:11     Assessment & Plan:  Plan  I am having Theodosia Paling start on amLODipine. I am also having him maintain his levothyroxine, lisinopril, fenofibrate, atorvastatin, and escitalopram.  Meds ordered this encounter  Medications  . amLODipine (NORVASC) 5 MG tablet    Sig: Take 1 tablet (5 mg total) by mouth daily.    Dispense:  30 tablet    Refill:  1    Problem List Items Addressed This Visit      Unprioritized   HTN (hypertension) - Primary    Poorly controlled will alter medications, encouraged DASH diet, minimize caffeine and obtain adequate sleep. Report concerning symptoms and follow up as directed and as needed      Relevant Medications   amLODipine (NORVASC) 5 MG tablet   Other Relevant Orders   TSH (Completed)   Comprehensive metabolic panel (Completed)   Hemoglobin A1c (Completed)   Lipid Profile (Completed)   CBC with Differential/Platelet (Completed)    Other Visit Diagnoses    Dizziness       Relevant Orders   TSH (Completed)   Comprehensive metabolic panel (Completed)   Hemoglobin A1c (Completed)   Lipid Profile (Completed)   CBC with Differential/Platelet (Completed)   Hyperlipidemia LDL goal <100       Relevant Medications   amLODipine (NORVASC) 5 MG tablet   Other Relevant Orders   Comprehensive metabolic panel (Completed)   Lipid Profile (Completed)   Hypothyroidism, unspecified type       Relevant Orders   TSH (Completed)   Snoring       Relevant Orders   Ambulatory referral to  Pulmonology      Follow-up: Return in about 2 weeks (around 04/20/2018), or if symptoms worsen or fail to improve, for bp check.  Ann Held, DO

## 2018-04-07 LAB — TSH: TSH: 6.72 mIU/L — ABNORMAL HIGH (ref 0.40–4.50)

## 2018-04-07 LAB — CBC WITH DIFFERENTIAL/PLATELET
BASOS ABS: 44 {cells}/uL (ref 0–200)
Basophils Relative: 0.5 %
Eosinophils Absolute: 282 cells/uL (ref 15–500)
Eosinophils Relative: 3.2 %
HEMATOCRIT: 44.4 % (ref 38.5–50.0)
HEMOGLOBIN: 15.5 g/dL (ref 13.2–17.1)
LYMPHS ABS: 1769 {cells}/uL (ref 850–3900)
MCH: 28.8 pg (ref 27.0–33.0)
MCHC: 34.9 g/dL (ref 32.0–36.0)
MCV: 82.4 fL (ref 80.0–100.0)
MPV: 14.5 fL — ABNORMAL HIGH (ref 7.5–12.5)
Monocytes Relative: 7 %
NEUTROS PCT: 69.2 %
Neutro Abs: 6090 cells/uL (ref 1500–7800)
Platelets: 69 10*3/uL — ABNORMAL LOW (ref 140–400)
RBC: 5.39 10*6/uL (ref 4.20–5.80)
RDW: 13.4 % (ref 11.0–15.0)
TOTAL LYMPHOCYTE: 20.1 %
WBC mixed population: 616 cells/uL (ref 200–950)
WBC: 8.8 10*3/uL (ref 3.8–10.8)

## 2018-04-07 LAB — COMPREHENSIVE METABOLIC PANEL
AG RATIO: 2.3 (calc) (ref 1.0–2.5)
ALBUMIN MSPROF: 4.6 g/dL (ref 3.6–5.1)
ALT: 25 U/L (ref 9–46)
AST: 21 U/L (ref 10–40)
Alkaline phosphatase (APISO): 137 U/L — ABNORMAL HIGH (ref 40–115)
BILIRUBIN TOTAL: 0.7 mg/dL (ref 0.2–1.2)
BUN: 15 mg/dL (ref 7–25)
CALCIUM: 9.4 mg/dL (ref 8.6–10.3)
CO2: 23 mmol/L (ref 20–32)
Chloride: 107 mmol/L (ref 98–110)
Creat: 1.09 mg/dL (ref 0.60–1.35)
Globulin: 2 g/dL (calc) (ref 1.9–3.7)
Glucose, Bld: 88 mg/dL (ref 65–99)
POTASSIUM: 4.7 mmol/L (ref 3.5–5.3)
SODIUM: 143 mmol/L (ref 135–146)
TOTAL PROTEIN: 6.6 g/dL (ref 6.1–8.1)

## 2018-04-07 LAB — LIPID PANEL
Cholesterol: 156 mg/dL (ref <200)
HDL: 29 mg/dL — ABNORMAL LOW (ref >40)
LDL CHOLESTEROL (CALC): 96 mg/dL
NON-HDL CHOLESTEROL (CALC): 127 mg/dL (ref <130)
TRIGLYCERIDES: 214 mg/dL — AB (ref <150)
Total CHOL/HDL Ratio: 5.4 (calc) — ABNORMAL HIGH (ref <5.0)

## 2018-04-07 LAB — HEMOGLOBIN A1C
Hgb A1c MFr Bld: 5.1 % of total Hgb (ref <5.7)
MEAN PLASMA GLUCOSE: 100 (calc)
eAG (mmol/L): 5.5 (calc)

## 2018-04-08 NOTE — Assessment & Plan Note (Signed)
Poorly controlled will alter medications, encouraged DASH diet, minimize caffeine and obtain adequate sleep. Report concerning symptoms and follow up as directed and as needed 

## 2018-04-14 ENCOUNTER — Other Ambulatory Visit: Payer: Self-pay | Admitting: Family Medicine

## 2018-04-14 DIAGNOSIS — I10 Essential (primary) hypertension: Secondary | ICD-10-CM

## 2018-04-16 ENCOUNTER — Other Ambulatory Visit: Payer: Self-pay | Admitting: Family Medicine

## 2018-04-16 DIAGNOSIS — E039 Hypothyroidism, unspecified: Secondary | ICD-10-CM

## 2018-04-16 DIAGNOSIS — I10 Essential (primary) hypertension: Secondary | ICD-10-CM

## 2018-04-16 NOTE — Progress Notes (Signed)
Labs ordered per Dr. Laury AxonLowne. Patient aware back for lab draw in 3 months.

## 2018-04-16 NOTE — Progress Notes (Signed)
Referal

## 2018-04-20 ENCOUNTER — Ambulatory Visit (INDEPENDENT_AMBULATORY_CARE_PROVIDER_SITE_OTHER): Payer: BLUE CROSS/BLUE SHIELD | Admitting: Family Medicine

## 2018-04-20 VITALS — BP 122/87 | HR 97

## 2018-04-20 DIAGNOSIS — I10 Essential (primary) hypertension: Secondary | ICD-10-CM | POA: Diagnosis not present

## 2018-04-20 NOTE — Progress Notes (Signed)
Pre visit review using our clinic tool,if applicable. No additional management support is needed unless otherwise documented below in the visit note.   Pt here for Blood pressure check per order from Dr. Seabron SpatesYvonne Lowne-Chase.  Pt currently takes Amlodipine 5 mg and Lisinopril 40 mg daily. No complaints voiced this visit.  Pt reports compliance with medication.  BP today @ = 122/87 P =97  Pt advised per Dr. Zola ButtonLowne-Chase to continue taking medications as ordered and return for follow up visit in 3 months. Patient states he will call back  To schedule appointment.

## 2018-04-20 NOTE — Progress Notes (Signed)
Reviewed  Stephanie Mcglone R Lowne Chase, DO  

## 2018-06-01 ENCOUNTER — Other Ambulatory Visit: Payer: Self-pay | Admitting: Family Medicine

## 2018-06-01 DIAGNOSIS — I10 Essential (primary) hypertension: Secondary | ICD-10-CM

## 2018-06-14 ENCOUNTER — Other Ambulatory Visit: Payer: Self-pay | Admitting: Family Medicine

## 2018-06-14 DIAGNOSIS — I10 Essential (primary) hypertension: Secondary | ICD-10-CM

## 2018-06-15 NOTE — Telephone Encounter (Signed)
Lisinopril refill sent to pharmacy. Pt is due for 3 month follow up on 07/21/18. Mychart appt reminder sent to pt.

## 2018-07-11 ENCOUNTER — Other Ambulatory Visit: Payer: Self-pay | Admitting: Family Medicine

## 2018-07-11 DIAGNOSIS — E785 Hyperlipidemia, unspecified: Secondary | ICD-10-CM

## 2018-07-19 ENCOUNTER — Other Ambulatory Visit: Payer: Self-pay | Admitting: Family Medicine

## 2018-07-20 ENCOUNTER — Ambulatory Visit: Payer: BLUE CROSS/BLUE SHIELD | Admitting: Internal Medicine

## 2018-07-20 ENCOUNTER — Encounter: Payer: Self-pay | Admitting: Internal Medicine

## 2018-07-20 VITALS — BP 128/90 | HR 70 | Ht 69.0 in | Wt 284.0 lb

## 2018-07-20 DIAGNOSIS — E039 Hypothyroidism, unspecified: Secondary | ICD-10-CM | POA: Diagnosis not present

## 2018-07-20 MED ORDER — LEVOTHYROXINE SODIUM 175 MCG PO TABS: 175.0000 ug | ORAL_TABLET | Freq: Every day | ORAL | 2 refills | Status: DC

## 2018-07-20 NOTE — Progress Notes (Addendum)
Patient ID: Daniel Acevedo, male   DOB: 08/27/1976, 42 y.o.   MRN: [id number]    HPI  Daniel Acevedo is a 42 y.o.-year-old male, referred by his PCP, Dr. Carollee Herter, for management of hypothyroidism.  Pt. has been dx with hypothyroidism in 2013 >> on Levothyroxine 200 mcg.  He takes the thyroid hormone: - fasting - with water - energy drinks 1h later - no b'fast usually, but if eating b'fast - 2h later - no calcium, iron, multivitamins  - + PPI along with LT4! - no Biotin  I reviewed pt's thyroid tests:  Lab Results  Component Value Date   TSH 6.72 (H) 04/06/2018   TSH 0.70 06/29/2017   TSH 20.20 (H) 03/24/2017   TSH 0.14 (L) 05/23/2016   TSH 4.23 10/26/2015   TSH 0.88 10/10/2014   TSH 0.66 08/01/2013   TSH 0.52 11/01/2012   TSH 1.16 04/25/2012   TSH 13.09 (H) 11/10/2011   FREET4 0.9 03/24/2017   FREET4 1.36 10/10/2014   FREET4 0.46 (L) 03/15/2010   T3FREE 2.3 03/24/2017   T3FREE 3.8 10/10/2014   T3FREE 2.1 (L) 03/15/2010   Antithyroid antibodies: No results found for: THGAB No components found for: TPOAB  Pt denies: - weight gain - he actually lost weight (15 pounds over the last year, intentionally) - cold intolerance - depression - constipation - dry skin - hair loss  However, he complains of: - fatigue  Pt denies feeling nodules in neck, hoarseness, dysphagia/odynophagia, SOB with lying down.  She has + FH of thyroid disorders in: mother, 2 children. No FH of thyroid cancer.  No h/o radiation tx to head or neck. No recent use of iodine supplements.  Pt. also has a history of HTN, HL, GERD.  He smokes 2 packs a day.  He did not really tried to stop.  ROS: Constitutional: + See HPI, + poor sleep Eyes: no blurry vision, no xerophthalmia ENT: no sore throat, no nodules palpated in throat, no dysphagia/odynophagia, no hoarseness Cardiovascular: no CP/SOB/palpitations/leg swelling Respiratory: no cough/SOB Gastrointestinal: no N/V/D/C/+ acid  reflux Musculoskeletal: no muscle/joint aches Skin: no rashes Neurological: no tremors/numbness/tingling/dizziness Psychiatric: no depression/anxiety + Low libido, + difficulty with erections  Past Medical History:  Diagnosis Date  . CARPAL TUNNEL SYNDROME, BILATERAL 01/04/2010  . DEPRESSIVE DISORDER 04/27/2010  . HYPERLIPIDEMIA 01/26/2010  . Hypertension   . HYPOTHYROIDISM 03/15/2010  . THROMBOCYTOPENIA 01/12/2010   Past Surgical History:  Procedure Laterality Date  . BONE MARROW BIOPSY     Social History   Socioeconomic History  . Marital status: Married    Spouse name: Not on file  . Number of children: 3  . Years of education: Not on file  . Highest education level: Not on file  Occupational History  . 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 Financial resource strain: Not on file  . Food insecurity:    Worry: Not on file    Inability: Not on file  . Transportation needs:    Medical: Not on file    Non-medical: Not on file  Tobacco Use  . Smoking status: Smoker, Current Status Unknown    Packs/day: 1.50    Years: 22.00    Pack years: 33.00  . Smokeless tobacco: Never Used  . Tobacco comment: pt has tried many otc--- and wellbutrin and chantix with no success  Substance and Sexual Activity  . Alcohol use: No    Alcohol/week: 0.0 standard  drinks  . Drug use: No   Current Outpatient Medications on File Prior to Visit  Medication Sig Dispense Refill  . amLODipine (NORVASC) 5 MG tablet TAKE 1 TABLET BY MOUTH EVERY DAY 30 tablet 1  . atorvastatin (LIPITOR) 40 MG tablet TAKE 1 TABLET BY MOUTH DAILY AT 6 PM. 30 tablet 5  . escitalopram (LEXAPRO) 20 MG tablet TAKE 1 TABLET BY MOUTH EVERY DAY 30 tablet 10  . fenofibrate 160 MG tablet TAKE 1 TABLET BY MOUTH EVERY DAY 30 tablet 1  . levothyroxine (SYNTHROID, LEVOTHROID) 200 MCG tablet Take 1 tablet (200 mcg total) by mouth daily. 30 tablet 8  . lisinopril (PRINIVIL,ZESTRIL) 40  MG tablet TAKE 1 TABLET BY MOUTH EVERY DAY 30 tablet 1   No current facility-administered medications on file prior to visit.    No Known Allergies Family History  Problem Relation Age of Onset  . Thyroid disease Mother        hypothyroidism  . Hypertension Mother   . Heart disease Father        cabg  . Diabetes Father   . Kidney disease Father   . Hypertension Father   . Hypertension Brother   . Heart disease Brother 21       MI  . Diabetes Brother   . Hypertension Brother   . Hyperlipidemia Brother     PE: BP 128/90   Pulse 70   Ht '5\' 9"'  (1.753 m) Comment: measured  Wt 284 lb (128.8 kg)   SpO2 96%   BMI 41.94 kg/m  Wt Readings from Last 3 Encounters:  07/20/18 284 lb (128.8 kg)  04/06/18 288 lb (130.6 kg)  06/29/17 299 lb 9.6 oz (135.9 kg)   Constitutional: Obese, in NAD Eyes: PERRLA, EOMI, no exophthalmos ENT: moist mucous membranes, no thyromegaly, no cervical lymphadenopathy Cardiovascular: RRR, No MRG Respiratory: CTA B Gastrointestinal: abdomen soft, NT, ND, BS+ Musculoskeletal: no deformities, strength intact in all 4 Skin: moist, warm, no rashes Neurological: + Mild tremor with outstretched hands, DTR normal in all 4  ASSESSMENT: 1. Hypothyroidism -Acquired  PLAN:  1. Patient with long-standing, uncontrolled, hypothyroidism, on levothyroxine therapy.  He has fluctuating TFTs since 2013 alternating between normal tests, along with over-and under-replacement with levothyroxine. - He is currently on 200 mcg levothyroxine daily - he appears euthyroid, but does complain of fatigue.  - he does not appear to have a goiter, thyroid nodules, or neck compression symptoms - We discussed about correct intake of levothyroxine, fasting, with water, separated by at least 30 minutes from breakfast, and separated by more than 4 hours from calcium, iron, multivitamins, acid reflux medications (PPIs).  He is not taking this correctly: Takes the PPIs along with  levothyroxine and the rest of his medicines.  I strongly advised him to separate PPIs by more than 4 hours from LT4.  In the meantime, I will ask him to decrease the levothyroxine dose to 175 mcg daily, since he will now start absorbing this much better. - will check thyroid tests in 5 to 6 weeks, after he starts to take it correctly: TSH, free T4, and will add TPO and ATA antibodies to screen for Hashimoto's thyroiditis.  We discussed what this means and the fact that it aggregating families.  I think the fact that his 2 teenage children have hypothyroidism points towards Hashimoto's thyroiditis, so we are checking his antibodies more for diagnostic purposes and implication for his children. - If labs today are abnormal, he will need  to return in ~6 weeks for repeat labs - Otherwise, I will see him back in 4 months  Orders Placed This Encounter  Procedures  . TSH  . T4, free  . Thyroid peroxidase antibody  . Thyroglobulin antibody   Component     Latest Ref Rng & Units 08/28/2018  TSH     0.35 - 4.50 uIU/mL 0.89  T4,Free(Direct)     0.60 - 1.60 ng/dL 1.09  Thyroglobulin Ab     < or = 1 IU/mL 671 (H)  Thyroperoxidase Ab SerPl-aCnc     <9 IU/mL 104 (H)   Normal TFTs now but with elevated antibodies pointing towards a new diagnosis of Hashimoto's thyroiditis.  Philemon Kingdom, MD PhD [facility]

## 2018-07-20 NOTE — Patient Instructions (Addendum)
Please decrease Levothyroxine to 175 mcg daily.  Move the acid reflux medicine at least 4h later.  Take the thyroid hormone every day, with water, at least 30 minutes before breakfast, separated by at least 4 hours from: - acid reflux medications - calcium - iron - multivitamins  Please come back in 5-6 weeks for a lab recheck.  Please come back for a follow-up appointment in 4 months.   Hypothyroidism Hypothyroidism is a disorder of the thyroid. The thyroid is a large gland that is located in the lower front of the neck. The thyroid releases hormones that control how the body works. With hypothyroidism, the thyroid does not make enough of these hormones. What are the causes? Causes of hypothyroidism may include:  Viral infections.  Pregnancy.  Your own defense system (immune system) attacking your thyroid.  Certain medicines.  Birth defects.  Past radiation treatments to your head or neck.  Past treatment with radioactive iodine.  Past surgical removal of part or all of your thyroid.  Problems with the gland that is located in the center of your brain (pituitary).  What are the signs or symptoms? Signs and symptoms of hypothyroidism may include:  Feeling as though you have no energy (lethargy).  Inability to tolerate cold.  Weight gain that is not explained by a change in diet or exercise habits.  Dry skin.  Coarse hair.  Menstrual irregularity.  Slowing of thought processes.  Constipation.  Sadness or depression.  How is this diagnosed? Your health care provider may diagnose hypothyroidism with blood tests and ultrasound tests. How is this treated? Hypothyroidism is treated with medicine that replaces the hormones that your body does not make. After you begin treatment, it may take several weeks for symptoms to go away. Follow these instructions at home:  Take medicines only as directed by your health care provider.  If you start taking any new  medicines, tell your health care provider.  Keep all follow-up visits as directed by your health care provider. This is important. As your condition improves, your dosage needs may change. You will need to have blood tests regularly so that your health care provider can watch your condition. Contact a health care provider if:  Your symptoms do not get better with treatment.  You are taking thyroid replacement medicine and: ? You sweat excessively. ? You have tremors. ? You feel anxious. ? You lose weight rapidly. ? You cannot tolerate heat. ? You have emotional swings. ? You have diarrhea. ? You feel weak. Get help right away if:  You develop chest pain.  You develop an irregular heartbeat.  You develop a rapid heartbeat. This information is not intended to replace advice given to you by your health care provider. Make sure you discuss any questions you have with your health care provider. Document Released: 09/12/2005 Document Revised: 02/18/2016 Document Reviewed: 01/28/2014 Elsevier Interactive Patient Education  2018 [employer].

## 2018-08-03 ENCOUNTER — Other Ambulatory Visit: Payer: Self-pay | Admitting: Family Medicine

## 2018-08-03 DIAGNOSIS — I10 Essential (primary) hypertension: Secondary | ICD-10-CM

## 2018-08-20 ENCOUNTER — Other Ambulatory Visit: Payer: Self-pay | Admitting: Family Medicine

## 2018-08-20 DIAGNOSIS — I10 Essential (primary) hypertension: Secondary | ICD-10-CM

## 2018-08-28 ENCOUNTER — Other Ambulatory Visit (INDEPENDENT_AMBULATORY_CARE_PROVIDER_SITE_OTHER): Payer: BLUE CROSS/BLUE SHIELD

## 2018-08-28 DIAGNOSIS — E039 Hypothyroidism, unspecified: Secondary | ICD-10-CM | POA: Diagnosis not present

## 2018-08-28 LAB — T4, FREE: Free T4: 1.09 ng/dL (ref 0.60–1.60)

## 2018-08-28 LAB — TSH: TSH: 0.89 u[IU]/mL (ref 0.35–4.50)

## 2018-08-29 LAB — THYROID PEROXIDASE ANTIBODY: THYROID PEROXIDASE ANTIBODY: 104 [IU]/mL — AB (ref <9)

## 2018-08-29 LAB — THYROGLOBULIN ANTIBODY: THYROGLOBULIN AB: 671 [IU]/mL — AB (ref <=1)

## 2018-08-30 NOTE — Addendum Note (Signed)
Addended by: Carlus PavlovGHERGHE, Ashantae Pangallo on: 08/30/2018 12:18 PM   Modules accepted: Orders

## 2018-10-11 ENCOUNTER — Encounter: Payer: Self-pay | Admitting: Family Medicine

## 2018-10-11 ENCOUNTER — Ambulatory Visit: Payer: BLUE CROSS/BLUE SHIELD | Admitting: Family Medicine

## 2018-10-11 VITALS — BP 112/70 | HR 64 | Temp 98.0°F | Ht 69.0 in | Wt 283.8 lb

## 2018-10-11 DIAGNOSIS — R109 Unspecified abdominal pain: Secondary | ICD-10-CM | POA: Diagnosis not present

## 2018-10-11 LAB — POCT URINALYSIS DIPSTICK
Bilirubin, UA: NEGATIVE
Glucose, UA: NEGATIVE
Ketones, UA: NEGATIVE
Nitrite, UA: NEGATIVE
Protein, UA: NEGATIVE
RBC UA: NEGATIVE
Spec Grav, UA: 1.015 (ref 1.010–1.025)
Urobilinogen, UA: 0.2 E.U./dL
pH, UA: 6 (ref 5.0–8.0)

## 2018-10-11 MED ORDER — CIPROFLOXACIN HCL 500 MG PO TABS: 500.0000 mg | ORAL_TABLET | Freq: Two times a day (BID) | ORAL | 0 refills | Status: DC

## 2018-10-11 NOTE — Progress Notes (Signed)
Check labs.  Adjust meds prnCheck labs.  Adjust meds prn Patient ID: Daniel Acevedo, male    DOB: 1975-11-14  Age: 43 y.o. MRN: [id number]    Subjective:  Subjective  HPI Daniel Acevedo presents for r flank pain and fatigue.  Symptoms since Sunday.   No dysuria, frequency.    Review of Systems  Constitutional: Positive for fatigue. Negative for appetite change, diaphoresis and unexpected weight change.  Eyes: Negative for pain, redness and visual disturbance.  Respiratory: Negative for cough, chest tightness, shortness of breath and wheezing.   Cardiovascular: Negative for chest pain, palpitations and leg swelling.  Endocrine: Negative for cold intolerance, heat intolerance, polydipsia, polyphagia and polyuria.  Genitourinary: Positive for flank pain. Negative for difficulty urinating, dysuria, frequency and hematuria.  Neurological: Negative for dizziness, light-headedness, numbness and headaches.    History Past Medical History:  Diagnosis Date  . CARPAL TUNNEL SYNDROME, BILATERAL 01/04/2010  . DEPRESSIVE DISORDER 04/27/2010  . HYPERLIPIDEMIA 01/26/2010  . Hypertension   . HYPOTHYROIDISM 03/15/2010  . THROMBOCYTOPENIA 01/12/2010    Daniel Acevedo has a past surgical history that includes Bone marrow biopsy.   His family history includes Diabetes in his brother and father; Heart disease in his father; Heart disease (age of onset: 77) in his brother; Hyperlipidemia in his brother; Hypertension in his brother, brother, father, and mother; Kidney disease in his father; Thyroid disease in his mother.Daniel Acevedo reports that Daniel Acevedo has been smoking. Daniel Acevedo has a 33.00 pack-year smoking history. Daniel Acevedo has never used smokeless tobacco. Daniel Acevedo reports that Daniel Acevedo does not drink alcohol or use drugs.  Current Outpatient Medications on File Prior to Visit  Medication Sig Dispense Refill  . amLODipine (NORVASC) 5 MG tablet Take 1 tablet (5 mg total) by mouth daily. Needs ov 90 tablet 0  . atorvastatin (LIPITOR) 40 MG tablet TAKE 1 TABLET  BY MOUTH DAILY AT 6 PM. 30 tablet 5  . escitalopram (LEXAPRO) 20 MG tablet TAKE 1 TABLET BY MOUTH EVERY DAY 30 tablet 10  . fenofibrate 160 MG tablet TAKE 1 TABLET BY MOUTH EVERY DAY 30 tablet 1  . levothyroxine (SYNTHROID, LEVOTHROID) 175 MCG tablet Take 1 tablet (175 mcg total) by mouth daily before breakfast. 45 tablet 2  . lisinopril (PRINIVIL,ZESTRIL) 40 MG tablet TAKE 1 TABLET BY MOUTH EVERY DAY 30 tablet 1   No current facility-administered medications on file prior to visit.      Objective:  Objective  Physical Exam Vitals signs and nursing note reviewed.  Constitutional:      Appearance: Daniel Acevedo is well-developed.  HENT:     Right Ear: External ear normal.     Left Ear: External ear normal.  Eyes:     General:        Right eye: No discharge.        Left eye: No discharge.     Conjunctiva/sclera: Conjunctivae normal.  Cardiovascular:     Rate and Rhythm: Normal rate and regular rhythm.     Heart sounds: Normal heart sounds. No murmur.  Pulmonary:     Effort: Pulmonary effort is normal. No respiratory distress.     Breath sounds: Normal breath sounds. No wheezing or rales.  Chest:     Chest wall: No tenderness.  Abdominal:     General: There is no distension.     Tenderness: There is no abdominal tenderness. There is right CVA tenderness. There is no left CVA tenderness or guarding.  Lymphadenopathy:     Cervical: No cervical adenopathy.  Neurological:  Mental Status: Daniel Acevedo is alert and oriented to person, place, and time.    BP 112/70 (BP Location: Right Arm, Patient Position: Sitting, Cuff Size: Large)   Pulse 64   Temp 98 F (36.7 C) (Oral)   Ht '5\' 9"'  (1.753 m)   Wt 283 lb 12.8 oz (128.7 kg)   SpO2 100%   BMI 41.91 kg/m  Wt Readings from Last 3 Encounters:  10/11/18 283 lb 12.8 oz (128.7 kg)  07/20/18 284 lb (128.8 kg)  04/06/18 288 lb (130.6 kg)     Lab Results  Component Value Date   WBC 8.8 04/06/2018   HGB 15.5 04/06/2018   HCT 44.4 04/06/2018    PLT 69 (L) 04/06/2018   GLUCOSE 88 04/06/2018   CHOL 156 04/06/2018   TRIG 214 (H) 04/06/2018   HDL 29 (L) 04/06/2018   LDLDIRECT 141.4 08/01/2013   LDLCALC 96 04/06/2018   ALT 25 04/06/2018   AST 21 04/06/2018   NA 143 04/06/2018   K 4.7 04/06/2018   CL 107 04/06/2018   CREATININE 1.09 04/06/2018   BUN 15 04/06/2018   CO2 23 04/06/2018   TSH 0.89 08/28/2018   HGBA1C 5.1 04/06/2018   MICROALBUR 1.4 10/10/2014    Dg Chest 2 View  Result Date: 12/02/2013 CLINICAL DATA:  Shortness of breath EXAM: CHEST  2 VIEW COMPARISON:  None. FINDINGS: Normal heart size and vascularity. Low lung volumes without pneumonia, edema, effusion, or pneumothorax. Trachea midline. IMPRESSION: Low volume chest exam without acute process Electronically Signed   By: Daryll Brod M.D.   On: 12/02/2013 17:11     Assessment & Plan:  Plan  I am having Daniel Acevedo start on ciprofloxacin. I am also having him maintain his fenofibrate, escitalopram, atorvastatin, levothyroxine, amLODipine, and lisinopril.  Meds ordered this encounter  Medications  . ciprofloxacin (CIPRO) 500 MG tablet    Sig: Take 1 tablet (500 mg total) by mouth 2 (two) times daily.    Dispense:  20 tablet    Refill:  0    Problem List Items Addressed This Visit    None    Visit Diagnoses    Right flank pain    -  Primary   Relevant Medications   ciprofloxacin (CIPRO) 500 MG tablet   Other Relevant Orders   POCT urinalysis dipstick (Completed)   Urine Culture (Completed)   Urine Microscopic Only (Completed)    strain urine --- suspect kidney stone Urine culture pending    Follow-up: No follow-ups on file.  Ann Held, DO

## 2018-10-11 NOTE — Patient Instructions (Signed)
Flank Pain, Adult  Flank pain is pain that is located on the side of the body between the upper abdomen and the back. This area is called the flank. The pain may occur over a short period of time (acute), or it may be long-term or recurring (chronic). It may be mild or severe. Flank pain can be caused by many things, including:  · Muscle soreness or injury.  · Kidney stones or kidney disease.  · Stress.  · A disease of the spine (vertebral disk disease).  · A lung infection (pneumonia).  · Fluid around the lungs (pulmonary edema).  · A skin rash caused by the chickenpox virus (shingles).  · Tumors that affect the back of the abdomen.  · Gallbladder disease.  Follow these instructions at home:    · Drink enough fluid to keep your urine clear or pale yellow.  · Rest as told by your health care provider.  · Take over-the-counter and prescription medicines only as told by your health care provider.  · Keep a journal to track what has caused your flank pain and what has made it feel better.  · Keep all follow-up visits as told by your health care provider. This is important.  Contact a health care provider if:  · Your pain is not controlled with medicine.  · You have new symptoms.  · Your pain gets worse.  · You have a fever.  · Your symptoms last longer than 2-3 days.  · You have trouble urinating or you are urinating very frequently.  Get help right away if:  · You have trouble breathing or you are short of breath.  · Your abdomen hurts or it is swollen or red.  · You have nausea or vomiting.  · You feel faint or you pass out.  · You have blood in your urine.  Summary  · Flank pain is pain that is located on the side of the body between the upper abdomen and the back.  · The pain may occur over a short period of time (acute), or it may be long-term or recurring (chronic). It may be mild or severe.  · Flank pain can be caused by many things.  · Contact your health care provider if your symptoms get worse or they last  longer than 2-3 days.  This information is not intended to replace advice given to you by your health care provider. Make sure you discuss any questions you have with your health care provider.  Document Released: 11/03/2005 Document Revised: 11/25/2016 Document Reviewed: 11/25/2016  Elsevier Interactive Patient Education © 2019 [employer]

## 2018-10-12 LAB — URINE CULTURE
MICRO NUMBER:: 65300
SPECIMEN QUALITY:: ADEQUATE

## 2018-10-12 LAB — URINALYSIS, MICROSCOPIC ONLY

## 2018-10-15 ENCOUNTER — Other Ambulatory Visit: Payer: Self-pay | Admitting: *Deleted

## 2018-10-15 MED ORDER — AMOXICILLIN 875 MG PO TABS: 875.0000 mg | ORAL_TABLET | Freq: Two times a day (BID) | ORAL | 0 refills | Status: AC

## 2018-10-22 ENCOUNTER — Encounter: Payer: Self-pay | Admitting: Family Medicine

## 2018-10-22 NOTE — Telephone Encounter (Signed)
We need to recheck the urine and is pain worse?  May need ct abd pelvis no contrast ---- r/o stone

## 2018-10-24 ENCOUNTER — Other Ambulatory Visit: Payer: Self-pay | Admitting: Family Medicine

## 2018-10-24 DIAGNOSIS — I10 Essential (primary) hypertension: Secondary | ICD-10-CM

## 2018-11-19 ENCOUNTER — Ambulatory Visit: Payer: BLUE CROSS/BLUE SHIELD | Admitting: Internal Medicine

## 2018-11-19 NOTE — Progress Notes (Deleted)
Patient ID: Daniel Acevedo, male   DOB: 02-Mar-1976, 43 y.o.   MRN: [id number]    HPI  Daniel Acevedo is a 43 y.o.-year-old male, returning for follow-up for hypothyroidism, diagnosed as Hashimoto's hypothyroidism after last visit.  Pt. has been dx with hypothyroidism in 2013 >> on levothyroxine.  Pt is on levothyroxine 175 mcg daily (dose decreased to 175 in 03/2018), taken: - in am - fasting - at least 2 hours from b'fast, but usually skips b'fast - no Ca, Fe, MVI - Now takes PPI later in the day, at last visit, he was taking this along with LT4! - not on Biotin  Reviewed patient's TFTs: Lab Results  Component Value Date   TSH 0.89 08/28/2018   TSH 6.72 (H) 04/06/2018   TSH 0.70 06/29/2017   TSH 20.20 (H) 03/24/2017   TSH 0.14 (L) 05/23/2016   TSH 4.23 10/26/2015   TSH 0.88 10/10/2014   TSH 0.66 08/01/2013   TSH 0.52 11/01/2012   TSH 1.16 04/25/2012   FREET4 1.09 08/28/2018   FREET4 0.9 03/24/2017   FREET4 1.36 10/10/2014   FREET4 0.46 (L) 03/15/2010   T3FREE 2.3 03/24/2017   T3FREE 3.8 10/10/2014   T3FREE 2.1 (L) 03/15/2010   Antithyroid antibodies were both elevated: Lab Results  Component Value Date   THGAB 671 (H) 08/28/2018   Component     Latest Ref Rng & Units 08/28/2018  Thyroperoxidase Ab SerPl-aCnc     <9 IU/mL 104 (H)   Patient denies: - Weight gain - Fatigue - Cold intolerance - Constipation - Hair loss  Pt denies: - feeling nodules in neck - hoarseness - dysphagia - choking - SOB with lying down  She has + FH of thyroid disorders in: mother, 2 children. No FH of thyroid cancer. No h/o radiation tx to head or neck.  No herbal supplements. No Biotin use. No recent steroids use.   Pt. also has a history of HTN, HL, GERD.  He smokes 2 packs a day.   ROS: Constitutional: + See HPI Eyes: no blurry vision, no xerophthalmia ENT: no sore throat, + see HPI Cardiovascular: no CP/no SOB/no palpitations/no leg swelling Respiratory: no cough/no  SOB/no wheezing Gastrointestinal: no N/no V/no D/no C/no acid reflux Musculoskeletal: no muscle aches/no joint aches Skin: no rashes, no hair loss Neurological: no tremors/no numbness/no tingling/no dizziness  I reviewed pt's medications, allergies, PMH, social hx, family hx, and changes were documented in the history of present illness. Otherwise, unchanged from my initial visit note.  Past Medical History:  Diagnosis Date  . CARPAL TUNNEL SYNDROME, BILATERAL 01/04/2010  . DEPRESSIVE DISORDER 04/27/2010  . HYPERLIPIDEMIA 01/26/2010  . Hypertension   . HYPOTHYROIDISM 03/15/2010  . THROMBOCYTOPENIA 01/12/2010   Past Surgical History:  Procedure Laterality Date  . BONE MARROW BIOPSY     Social History   Socioeconomic History  . Marital status: Married    Spouse name: Not on file  . Number of children: 3  . Years of education: Not on file  . Highest education level: Not on file  Occupational History  . 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 Financial resource strain: Not on file  . Food insecurity:    Worry: Not on file    Inability: Not on file  . Transportation needs:    Medical: Not on file    Non-medical: Not on file  Tobacco Use  . Smoking status: Smoker, Current Status  Unknown    Packs/day: 1.50    Years: 22.00    Pack years: 33.00  . Smokeless tobacco: Never Used  . Tobacco comment: pt has tried many otc--- and wellbutrin and chantix with no success  Substance and Sexual Activity  . Alcohol use: No    Alcohol/week: 0.0 standard drinks  . Drug use: No   Current Outpatient Medications on File Prior to Visit  Medication Sig Dispense Refill  . amLODipine (NORVASC) 5 MG tablet Take 1 tablet (5 mg total) by mouth daily. Needs ov 90 tablet 0  . atorvastatin (LIPITOR) 40 MG tablet TAKE 1 TABLET BY MOUTH DAILY AT 6 PM. 30 tablet 5  . ciprofloxacin (CIPRO) 500 MG tablet Take 1 tablet (500 mg total) by mouth 2 (two) times  daily. 20 tablet 0  . escitalopram (LEXAPRO) 20 MG tablet TAKE 1 TABLET BY MOUTH EVERY DAY 30 tablet 10  . fenofibrate 160 MG tablet TAKE 1 TABLET BY MOUTH EVERY DAY 30 tablet 1  . levothyroxine (SYNTHROID, LEVOTHROID) 175 MCG tablet Take 1 tablet (175 mcg total) by mouth daily before breakfast. 45 tablet 2  . lisinopril (PRINIVIL,ZESTRIL) 40 MG tablet TAKE 1 TABLET BY MOUTH EVERY DAY 90 tablet 1   No current facility-administered medications on file prior to visit.    No Known Allergies Family History  Problem Relation Age of Onset  . Thyroid disease Mother        hypothyroidism  . Hypertension Mother   . Heart disease Father        cabg  . Diabetes Father   . Kidney disease Father   . Hypertension Father   . Hypertension Brother   . Heart disease Brother 89       MI  . Diabetes Brother   . Hypertension Brother   . Hyperlipidemia Brother     PE: There were no vitals taken for this visit. Wt Readings from Last 3 Encounters:  10/11/18 283 lb 12.8 oz (128.7 kg)  07/20/18 284 lb (128.8 kg)  04/06/18 288 lb (130.6 kg)   Constitutional: overweight, in NAD Eyes: PERRLA, EOMI, no exophthalmos ENT: moist mucous membranes, no thyromegaly, no cervical lymphadenopathy Cardiovascular: RRR, No MRG Respiratory: CTA B Gastrointestinal: abdomen soft, NT, ND, BS+ Musculoskeletal: no deformities, strength intact in all 4 Skin: moist, warm, no rashes Neurological: + mild tremor with outstretched hands, DTR normal in all 4  ASSESSMENT: 1. Hypothyroidism -Acquired -Due to Hashimoto's thyroiditis  PLAN:  1. Patient with longstanding, uncontrolled, hypothyroidism, with elevated TPO and ATA antibodies checked at last visit.  He was on 200 mcg levothyroxine daily at that time but was not taking this correctly, since he was taking PPI along with LT4.  We separated these and decrease the dose of LT4 to 175 mcg daily.  TFTs normalized after this.   - Latest TSH was normal 08/2018. - he now  continues on LT4 175 mcg daily - pt feels good on this dose. - we discussed about taking the thyroid hormone every day, with water, >30 minutes before breakfast, separated by >4 hours from acid reflux medications, calcium, iron, multivitamins. Pt. is taking it correctly. - will check thyroid tests today: TSH and fT4 - If labs are abnormal, he will need to return for repeat TFTs in 1.5 months - We discussed about his new diagnosis of Hashimoto's thyroiditis.  I explained that there is an autoimmune disease, and the most likely cause of his hypothyroidism.  The condition aggravating families.  Besides  thyroid hormones, there is no specific treatment for the condition.  - time spent with the patient: *** min, of which >50% was spent in obtaining information about his symptoms, reviewing his previous labs, evaluations, and treatments, counseling him about his condition (please see the discussed topics above), and developing a plan to further investigate and treat it; he had a number of questions which I addressed.  Philemon Kingdom, MD PhD [facility]

## 2018-12-04 ENCOUNTER — Other Ambulatory Visit: Payer: Self-pay | Admitting: Internal Medicine

## 2018-12-04 ENCOUNTER — Other Ambulatory Visit: Payer: Self-pay | Admitting: Family Medicine

## 2018-12-16 ENCOUNTER — Other Ambulatory Visit: Payer: Self-pay | Admitting: Family Medicine

## 2018-12-16 DIAGNOSIS — I10 Essential (primary) hypertension: Secondary | ICD-10-CM

## 2018-12-27 ENCOUNTER — Encounter: Payer: Self-pay | Admitting: Family Medicine

## 2018-12-27 ENCOUNTER — Other Ambulatory Visit: Payer: Self-pay

## 2018-12-27 ENCOUNTER — Ambulatory Visit (INDEPENDENT_AMBULATORY_CARE_PROVIDER_SITE_OTHER): Payer: BLUE CROSS/BLUE SHIELD | Admitting: Family Medicine

## 2018-12-27 DIAGNOSIS — J40 Bronchitis, not specified as acute or chronic: Secondary | ICD-10-CM | POA: Diagnosis not present

## 2018-12-27 MED ORDER — AZITHROMYCIN 250 MG PO TABS: ORAL_TABLET | ORAL | 0 refills | Status: DC

## 2018-12-27 MED ORDER — PROMETHAZINE-DM 6.25-15 MG/5ML PO SYRP: 5.0000 mL | ORAL_SOLUTION | Freq: Four times a day (QID) | ORAL | 0 refills | Status: DC | PRN

## 2018-12-27 NOTE — Telephone Encounter (Signed)
Scheduled appt today with Dr Zola Button at 10:30am. Pt has been given verbal instructions for downloading app and verified email address on file is correct. Advised pt I will send written instructions to him via mychart and he voices understanding.  Webex invite will be sent.

## 2018-12-27 NOTE — Progress Notes (Signed)
Virtual Visit via Video Note  I connected with Daniel Acevedo on 12/27/18 at 10:30 AM EDT by a video enabled telemedicine application and verified that I am speaking with the correct person using two identifiers.   I discussed the limitations of evaluation and management by telemedicine and the availability of in person appointments. The patient expressed understanding and agreed to proceed.  History of Present Illness: Pt at home c/o cough and congestion  X 4 days   + productive cough, no fever, no wheezing.  No otc meds .  No sinus pressure.    + clear mucus   Pt is a smoker Pt has not traveled and has not had any known contact with +covid 19     Observations/Objective: Afebrile 97.6 Pt in NAD   Assessment and Plan: 1. Bronchitis Call if symptoms do not improve--- if worsening symptoms and trouble breathing go to ER - azithromycin (ZITHROMAX Z-PAK) 250 MG tablet; As directed  Dispense: 6 each; Refill: 0 - promethazine-dextromethorphan (PROMETHAZINE-DM) 6.25-15 MG/5ML syrup; Take 5 mLs by mouth 4 (four) times daily as needed.  Dispense: 118 mL; Refill: 0   Follow Up Instructions:    I discussed the assessment and treatment plan with the patient. The patient was provided an opportunity to ask questions and all were answered. The patient agreed with the plan and demonstrated an understanding of the instructions.   The patient was advised to call back or seek an in-person evaluation if the symptoms worsen or if the condition fails to improve as anticipated.  I provided 15 minutes of non-face-to-face time during this encounter.   Donato Schultz, DO

## 2019-01-01 ENCOUNTER — Other Ambulatory Visit: Payer: Self-pay

## 2019-01-01 ENCOUNTER — Encounter: Payer: Self-pay | Admitting: Family Medicine

## 2019-01-01 ENCOUNTER — Ambulatory Visit (INDEPENDENT_AMBULATORY_CARE_PROVIDER_SITE_OTHER): Payer: BLUE CROSS/BLUE SHIELD | Admitting: Family Medicine

## 2019-01-01 DIAGNOSIS — J40 Bronchitis, not specified as acute or chronic: Secondary | ICD-10-CM | POA: Diagnosis not present

## 2019-01-01 MED ORDER — LEVOFLOXACIN 500 MG PO TABS: 500.0000 mg | ORAL_TABLET | Freq: Every day | ORAL | 0 refills | Status: DC

## 2019-01-01 MED ORDER — ALBUTEROL SULFATE HFA 108 (90 BASE) MCG/ACT IN AERS: 2.0000 | INHALATION_SPRAY | Freq: Four times a day (QID) | RESPIRATORY_TRACT | 0 refills | Status: DC | PRN

## 2019-01-01 MED ORDER — PREDNISONE 10 MG PO TABS: ORAL_TABLET | ORAL | 0 refills | Status: DC

## 2019-01-01 NOTE — Telephone Encounter (Signed)
Virtual visit scheduled.  

## 2019-01-01 NOTE — Progress Notes (Signed)
Virtual Visit via Video Note  I connected with Daniel Acevedo on 01/01/19 at  2:45 PM EDT by a video enabled telemedicine application and verified that I am speaking with the correct person using two identifiers.   I discussed the limitations of evaluation and management by telemedicine and the availability of in person appointments. The patient expressed understanding and agreed to proceed.  History of Present Illness: Pt is home c/o con't cough and productive cough  He has finished all his meds from his last visit  No fever--- temp 99.3 with no meds in him  He is wheezing , cough is productive     Observations/Objective: Temp 99.3   No palp, RR normal  No chest pain   Assessment and Plan: 1. Bronchitis meds per orders If any inc fever , sob -- go to ER   - levofloxacin (LEVAQUIN) 500 MG tablet; Take 1 tablet (500 mg total) by mouth daily.  Dispense: 7 tablet; Refill: 0 - albuterol (PROVENTIL HFA;VENTOLIN HFA) 108 (90 Base) MCG/ACT inhaler; Inhale 2 puffs into the lungs every 6 (six) hours as needed for wheezing or shortness of breath.  Dispense: 1 Inhaler; Refill: 0 - predniSONE (DELTASONE) 10 MG tablet; TAKE 3 TABLETS PO QD FOR 3 DAYS THEN TAKE 2 TABLETS PO QD FOR 3 DAYS THEN TAKE 1 TABLET PO QD FOR 3 DAYS THEN TAKE 1/2 TAB PO QD FOR 3 DAYS  Dispense: 20 tablet; Refill: 0   Follow Up Instructions:    I discussed the assessment and treatment plan with the patient. The patient was provided an opportunity to ask questions and all were answered. The patient agreed with the plan and demonstrated an understanding of the instructions.   The patient was advised to call back or seek an in-person evaluation if the symptoms worsen or if the condition fails to improve as anticipated.  I provided 15 minutes of non-face-to-face time during this encounter.   Donato Schultz, DO

## 2019-01-02 ENCOUNTER — Encounter: Payer: Self-pay | Admitting: Family Medicine

## 2019-01-04 NOTE — Telephone Encounter (Signed)
Copied from CRM [id number]. Topic: General - Inquiry >> Jan 04, 2019 11:39 AM Floria Raveling A wrote: Pt called in and would like to know if he can get a work note   note to my work saying that you put him out of work until Monday. Please state that I have bronchitis/upper respiratory infection and that due to COVID-19 you didn't want me to go to work. The fax number is [phone]   Reason for CRM Best number  [phone]

## 2019-01-07 ENCOUNTER — Encounter: Payer: Self-pay | Admitting: *Deleted

## 2019-01-07 ENCOUNTER — Encounter: Payer: Self-pay | Admitting: Family Medicine

## 2019-01-09 ENCOUNTER — Ambulatory Visit (INDEPENDENT_AMBULATORY_CARE_PROVIDER_SITE_OTHER)
Admission: RE | Admit: 2019-01-09 | Discharge: 2019-01-09 | Disposition: A | Discharge status: Home / Self Care | Payer: BLUE CROSS/BLUE SHIELD | Source: Ambulatory Visit | Attending: Family Medicine | Admitting: Family Medicine

## 2019-01-09 ENCOUNTER — Other Ambulatory Visit: Payer: Self-pay

## 2019-01-09 ENCOUNTER — Ambulatory Visit (INDEPENDENT_AMBULATORY_CARE_PROVIDER_SITE_OTHER): Payer: BLUE CROSS/BLUE SHIELD | Admitting: Family Medicine

## 2019-01-09 DIAGNOSIS — R05 Cough: Secondary | ICD-10-CM

## 2019-01-09 DIAGNOSIS — J209 Acute bronchitis, unspecified: Secondary | ICD-10-CM

## 2019-01-09 DIAGNOSIS — R0683 Snoring: Secondary | ICD-10-CM

## 2019-01-09 DIAGNOSIS — R059 Cough, unspecified: Secondary | ICD-10-CM

## 2019-01-09 MED ORDER — BECLOMETHASONE DIPROP HFA 40 MCG/ACT IN AERB: 2.0000 | INHALATION_SPRAY | Freq: Two times a day (BID) | RESPIRATORY_TRACT | 2 refills | Status: DC

## 2019-01-09 NOTE — Progress Notes (Signed)
Virtual Visit via Video Note  I connected with [name] on 01/09/19 at  9:30 AM EDT by a video enabled telemedicine application and verified that I am speaking with the correct person using two identifiers.   I discussed the limitations of evaluation and management by telemedicine and the availability of in person appointments. The patient expressed understanding and agreed to proceed.  History of Present Illness: Pt home c/o cont cough and sore throat.  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 Cough  - DG Chest 2 View; Future - Ambulatory referral to Pulmonology  2. Bronchitis, acute, with bronchospasm Pt finished with abx pred almost done Check cxr today Refer to pulm Again d/w the importance of quitting smoking  con't albuterol prn  Add qvar bid-- pt instructed to brush tongue and rinse mouth with water  - beclomethasone (QVAR REDIHALER) 40 MCG/ACT inhaler; Inhale 2 puffs into the lungs 2 (two) times daily.  Dispense: 1 Inhaler; Refill: 2  3. Snoring Pt never went to pulmonary--  New referral put in  - Ambulatory referral to Pulmonology   Follow Up Instructions:    I discussed the assessment and treatment plan with the patient. The patient was provided an opportunity to ask questions and all were answered. The patient agreed with the plan and demonstrated an understanding of the instructions.   The patient was advised to call back or seek an in-person evaluation if the symptoms worsen or if the condition fails to improve as anticipated.     Daniel Schultz, DO

## 2019-01-09 NOTE — Telephone Encounter (Signed)
Video visit done today

## 2019-01-11 ENCOUNTER — Encounter: Payer: Self-pay | Admitting: Family Medicine

## 2019-03-23 ENCOUNTER — Other Ambulatory Visit: Payer: Self-pay | Admitting: Family Medicine

## 2019-03-23 DIAGNOSIS — I10 Essential (primary) hypertension: Secondary | ICD-10-CM

## 2019-05-15 ENCOUNTER — Other Ambulatory Visit: Payer: Self-pay | Admitting: *Deleted

## 2019-05-15 DIAGNOSIS — E785 Hyperlipidemia, unspecified: Secondary | ICD-10-CM

## 2019-05-15 MED ORDER — ATORVASTATIN CALCIUM 40 MG PO TABS: ORAL_TABLET | ORAL | 1 refills | Status: DC

## 2019-05-30 ENCOUNTER — Other Ambulatory Visit: Payer: Self-pay | Admitting: Internal Medicine

## 2019-05-30 NOTE — Telephone Encounter (Signed)
Last office visit 07/20/18 last labs 08/28/18  Cancel/No-show? none  Future office visit scheduled? none

## 2019-05-30 NOTE — Telephone Encounter (Signed)
Okay to refill and let's schedule another appointment in 2 to 3 months.

## 2019-07-29 ENCOUNTER — Encounter: Payer: Self-pay | Admitting: Family Medicine

## 2019-07-29 ENCOUNTER — Other Ambulatory Visit: Payer: Self-pay

## 2019-07-29 DIAGNOSIS — Z20822 Contact with and (suspected) exposure to covid-19: Secondary | ICD-10-CM

## 2019-07-30 LAB — NOVEL CORONAVIRUS, NAA: SARS-CoV-2, NAA: NOT DETECTED

## 2019-07-31 ENCOUNTER — Encounter: Payer: Self-pay | Admitting: Family Medicine

## 2019-07-31 ENCOUNTER — Telehealth: Payer: BLUE CROSS/BLUE SHIELD | Admitting: Family

## 2019-07-31 ENCOUNTER — Encounter (INDEPENDENT_AMBULATORY_CARE_PROVIDER_SITE_OTHER): Payer: Self-pay

## 2019-07-31 DIAGNOSIS — Z20828 Contact with and (suspected) exposure to other viral communicable diseases: Secondary | ICD-10-CM

## 2019-07-31 DIAGNOSIS — Z20822 Contact with and (suspected) exposure to covid-19: Secondary | ICD-10-CM

## 2019-07-31 MED ORDER — BENZONATATE 100 MG PO CAPS: 100.0000 mg | ORAL_CAPSULE | Freq: Three times a day (TID) | ORAL | 0 refills | Status: DC | PRN

## 2019-07-31 NOTE — Progress Notes (Signed)
E-Visit for Corona Virus Screening   Your current symptoms could be consistent with the coronavirus.  Many health care providers can now test patients at their office but not all are.  [facility] has multiple testing sites. For information on our COVID testing locations and hours go to [email or web address]  Please quarantine yourself while awaiting your test results.  We are enrolling you in our MyChart [facility] for COVID19 . Daily you will receive a questionnaire within the MyChart website. Our COVID 19 response team willl be monitoriing your responses daily.  You can go to one of the testing sites listed below, while they are opened (see hours). You do not need an order and will stay in your car during the test. You do need to self isolate until your results return and if positive 10 days from when your symptoms started and until you are 3 days fever free.   Testing Locations (Monday - Friday, 8 a.m. - 3:30 p.m.) . [place]: [facility], [address], [place], [place]  . [place]: [facility], [address], [place], [place] (entrance [address])  . [place]: (Closed each Monday): Testing site relocated to the short stay covered drive at [facility]. (Use the [address] entrance to [facility] next to [facility].)   COVID-19 is a respiratory illness with symptoms that are similar to the flu. Symptoms are typically mild to moderate, but there have been cases of severe illness and death due to the virus. The following symptoms may appear 2-14 days after exposure: . Fever . Cough . Shortness of breath or difficulty breathing . Chills . Repeated shaking with chills . Muscle pain . Headache . Sore throat . New loss of taste or smell . Fatigue . Congestion or runny nose . Nausea or vomiting . Diarrhea  It is vitally important that if you feel that you  have an infection such as this virus or any other virus that you stay home and away from places where you may spread it to others.  You should self-quarantine for 14 days if you have symptoms that could potentially be coronavirus or have been in close contact a with a person diagnosed with COVID-19 within the last 2 weeks. You should avoid contact with people age 65 and older.   You should wear a mask or cloth face covering over your nose and mouth if you must be around other people or animals, including pets (even at home). Try to stay at least 6 feet away from other people. This will protect the people around you.  You can use medication such as A prescription cough medication called Tessalon Perles 100 mg. You may take 1-2 capsules every 8 hours as needed for cough.  You may also take acetaminophen (Tylenol) as needed for fever.   Reduce your risk of any infection by using the same precautions used for avoiding the common cold or flu:  . Wash your hands often with soap and warm water for at least 20 seconds.  If soap and water are not readily available, use an alcohol-based hand sanitizer with at least 60% alcohol.  . If coughing or sneezing, cover your mouth and nose by coughing or sneezing into the elbow areas of your shirt or coat, into a tissue or into your sleeve (not your hands). . Avoid shaking hands with others and consider head nods or verbal greetings only. . Avoid touching your   eyes, nose, or mouth with unwashed hands.  . Avoid close contact with people who are sick. . Avoid places or events with large numbers of people in one location, like concerts or sporting events. . Carefully consider travel plans you have or are making. . If you are planning any travel outside or inside the [place], visit the CDC's Travelers' Health webpage for the latest health notices. . If you have some symptoms but not all symptoms, continue to monitor at home and seek medical attention if your symptoms  worsen. . If you are having a medical emergency, call 911.  HOME CARE . Only take medications as instructed by your medical team. . Drink plenty of fluids and get plenty of rest. . A steam or ultrasonic humidifier can help if you have congestion.   GET HELP RIGHT AWAY IF YOU HAVE EMERGENCY WARNING SIGNS** FOR COVID-19. If you or someone is showing any of these signs seek emergency medical care immediately. Call 911 or proceed to your closest emergency facility if: . You develop worsening high fever. . Trouble breathing . Bluish lips or face . Persistent pain or pressure in the chest . New confusion . Inability to wake or stay awake . You cough up blood. . Your symptoms become more severe  **This list is not all possible symptoms. Contact your medical provider for any symptoms that are sever or concerning to you.   MAKE SURE YOU   Understand these instructions.  Will watch your condition.  Will get help right away if you are not doing well or get worse.  Your e-visit answers were reviewed by a board certified advanced clinical practitioner to complete your personal care plan.  Depending on the condition, your plan could have included both over the counter or prescription medications.  If there is a problem please reply once you have received a response from your provider.  Your safety is important to us.  If you have drug allergies check your prescription carefully.    You can use MyChart to ask questions about today's visit, request a non-urgent call back, or ask for a work or school excuse for 24 hours related to this e-Visit. If it has been greater than 24 hours you will need to follow up with your provider, or enter a new e-Visit to address those concerns. You will get an e-mail in the next two days asking about your experience.  I hope that your e-visit has been valuable and will speed your recovery. Thank you for using e-visits.  Approximately 5 minutes was spent documenting  and reviewing patient's chart.    

## 2019-08-01 ENCOUNTER — Encounter (INDEPENDENT_AMBULATORY_CARE_PROVIDER_SITE_OTHER): Payer: Self-pay

## 2019-08-04 ENCOUNTER — Encounter (INDEPENDENT_AMBULATORY_CARE_PROVIDER_SITE_OTHER): Payer: Self-pay

## 2019-08-13 ENCOUNTER — Telehealth: Payer: Self-pay | Admitting: *Deleted

## 2019-08-13 DIAGNOSIS — I10 Essential (primary) hypertension: Secondary | ICD-10-CM

## 2019-08-13 MED ORDER — LISINOPRIL 40 MG PO TABS: 40.0000 mg | ORAL_TABLET | Freq: Every day | ORAL | 0 refills | Status: DC

## 2019-08-13 NOTE — Telephone Encounter (Signed)
Left message on machine that patient needs to schedule follow up.  Has not been seen for a general follow up in a while.

## 2019-09-10 ENCOUNTER — Other Ambulatory Visit: Payer: Self-pay | Admitting: Family Medicine

## 2019-09-10 DIAGNOSIS — I10 Essential (primary) hypertension: Secondary | ICD-10-CM

## 2019-10-16 ENCOUNTER — Other Ambulatory Visit: Payer: Self-pay | Admitting: Family Medicine

## 2019-10-16 DIAGNOSIS — I10 Essential (primary) hypertension: Secondary | ICD-10-CM

## 2019-10-17 ENCOUNTER — Other Ambulatory Visit: Payer: Self-pay

## 2019-10-18 ENCOUNTER — Other Ambulatory Visit: Payer: Self-pay

## 2019-10-18 ENCOUNTER — Ambulatory Visit (INDEPENDENT_AMBULATORY_CARE_PROVIDER_SITE_OTHER): Payer: BC Managed Care – PPO | Admitting: Family Medicine

## 2019-10-18 ENCOUNTER — Encounter: Payer: Self-pay | Admitting: Family Medicine

## 2019-10-18 VITALS — BP 110/70 | HR 81 | Temp 98.1°F | Resp 18 | Ht 69.0 in | Wt 303.6 lb

## 2019-10-18 DIAGNOSIS — R0683 Snoring: Secondary | ICD-10-CM | POA: Diagnosis not present

## 2019-10-18 DIAGNOSIS — I10 Essential (primary) hypertension: Secondary | ICD-10-CM

## 2019-10-18 DIAGNOSIS — E785 Hyperlipidemia, unspecified: Secondary | ICD-10-CM | POA: Diagnosis not present

## 2019-10-18 DIAGNOSIS — R55 Syncope and collapse: Secondary | ICD-10-CM | POA: Diagnosis not present

## 2019-10-18 DIAGNOSIS — R9431 Abnormal electrocardiogram [ECG] [EKG]: Secondary | ICD-10-CM

## 2019-10-18 DIAGNOSIS — G4700 Insomnia, unspecified: Secondary | ICD-10-CM

## 2019-10-18 LAB — COMPREHENSIVE METABOLIC PANEL
ALT: 26 U/L (ref 0–53)
AST: 20 U/L (ref 0–37)
Albumin: 4.6 g/dL (ref 3.5–5.2)
Alkaline Phosphatase: 120 U/L — ABNORMAL HIGH (ref 39–117)
BUN: 14 mg/dL (ref 6–23)
CO2: 27 mEq/L (ref 19–32)
Calcium: 9.5 mg/dL (ref 8.4–10.5)
Chloride: 104 mEq/L (ref 96–112)
Creatinine, Ser: 0.97 mg/dL (ref 0.40–1.50)
GFR: 85.2 mL/min (ref >60.00)
Glucose, Bld: 94 mg/dL (ref 70–99)
Potassium: 4.1 mEq/L (ref 3.5–5.1)
Sodium: 140 mEq/L (ref 135–145)
Total Bilirubin: 0.8 mg/dL (ref 0.2–1.2)
Total Protein: 6.8 g/dL (ref 6.0–8.3)

## 2019-10-18 LAB — LIPID PANEL
Cholesterol: 168 mg/dL (ref 0–200)
HDL: 37 mg/dL — ABNORMAL LOW (ref >39.00)
LDL Cholesterol: 100 mg/dL — ABNORMAL HIGH (ref 0–99)
NonHDL: 131.16
Total CHOL/HDL Ratio: 5
Triglycerides: 158 mg/dL — ABNORMAL HIGH (ref 0.0–149.0)
VLDL: 31.6 mg/dL (ref 0.0–40.0)

## 2019-10-18 MED ORDER — AMLODIPINE BESYLATE 5 MG PO TABS: 5.0000 mg | ORAL_TABLET | Freq: Every day | ORAL | 1 refills | Status: DC

## 2019-10-18 NOTE — Assessment & Plan Note (Signed)
Well controlled, no changes to meds. Encouraged heart healthy diet such as the DASH diet and exercise as tolerated.  °

## 2019-10-18 NOTE — Progress Notes (Signed)
Patient ID: Daniel Acevedo, male    DOB: May 07, 1976  Age: 44 y.o. MRN: [id number]    ---Subjective:  Subjective  HPI Daniel Acevedo presents for f/u bp and chol.  He also states he had an episode of syncope in a restaurant after christmas.  He choked on his Tea and passed out.  He did not go to ER.  He felt fine after he came to.  No chest pain, no palpitations  Review of Systems  Constitutional: Negative for appetite change, diaphoresis, fatigue and unexpected weight change.  Eyes: Negative for pain, redness and visual disturbance.  Respiratory: Negative for cough, chest tightness, shortness of breath and wheezing.   Cardiovascular: Negative for chest pain, palpitations and leg swelling.  Endocrine: Negative for cold intolerance, heat intolerance, polydipsia, polyphagia and polyuria.  Genitourinary: Negative for difficulty urinating, dysuria and frequency.  Neurological: Negative for dizziness, light-headedness, numbness and headaches.    History Past Medical History:  Diagnosis Date  . CARPAL TUNNEL SYNDROME, BILATERAL 01/04/2010  . DEPRESSIVE DISORDER 04/27/2010  . HYPERLIPIDEMIA 01/26/2010  . Hypertension   . HYPOTHYROIDISM 03/15/2010  . THROMBOCYTOPENIA 01/12/2010    He has a past surgical history that includes Bone marrow biopsy.   His family history includes Diabetes in his brother and father; Heart disease in his father; Heart disease (age of onset: 38) in his brother; Hyperlipidemia in his brother; Hypertension in his brother, brother, father, and mother; Kidney disease in his father; Thyroid disease in his mother.He reports that he has been smoking. He has a 33.00 pack-year smoking history. He has never used smokeless tobacco. He reports that he does not drink alcohol or use drugs.  Current Outpatient Medications on File Prior to Visit  Medication Sig Dispense Refill  . albuterol (PROVENTIL HFA;VENTOLIN HFA) 108 (90 Base) MCG/ACT inhaler Inhale 2 puffs into the lungs every 6 (six)  hours as needed for wheezing or shortness of breath. 1 Inhaler 0  . atorvastatin (LIPITOR) 40 MG tablet TAKE 1 TABLET BY MOUTH DAILY AT 6 PM. 90 tablet 1  . beclomethasone (QVAR REDIHALER) 40 MCG/ACT inhaler Inhale 2 puffs into the lungs 2 (two) times daily. 1 Inhaler 2  . escitalopram (LEXAPRO) 20 MG tablet TAKE 1 TABLET BY MOUTH EVERY DAY 30 tablet 10  . fenofibrate 160 MG tablet TAKE 1 TABLET BY MOUTH EVERY DAY 30 tablet 1  . levofloxacin (LEVAQUIN) 500 MG tablet Take 1 tablet (500 mg total) by mouth daily. 7 tablet 0  . levothyroxine (SYNTHROID) 175 MCG tablet TAKE 1 TABLET (175 MCG TOTAL) BY MOUTH DAILY BEFORE BREAKFAST. 30 tablet 4  . lisinopril (ZESTRIL) 40 MG tablet Take 1 tablet (40 mg total) by mouth daily. Needs ov before any more refills 90 tablet 0   No current facility-administered medications on file prior to visit.     Objective:  Objective  Physical Exam Vitals and nursing note reviewed.  Constitutional:      General: He is sleeping.     Appearance: He is well-developed.  HENT:     Head: Normocephalic and atraumatic.  Eyes:     Pupils: Pupils are equal, round, and reactive to light.  Neck:     Thyroid: No thyromegaly.  Cardiovascular:     Rate and Rhythm: Normal rate and regular rhythm.     Heart sounds: No murmur.  Pulmonary:     Effort: Pulmonary effort is normal. No respiratory distress.     Breath sounds: Normal breath sounds. No wheezing or rales.  Chest:  Chest wall: No tenderness.  Musculoskeletal:        General: No tenderness.     Cervical back: Normal range of motion and neck supple.  Skin:    General: Skin is warm and dry.  Neurological:     Mental Status: He is oriented to person, place, and time.  Psychiatric:        Behavior: Behavior normal.        Thought Content: Thought content normal.        Judgment: Judgment normal.    BP 110/70 (BP Location: Right Arm, Patient Position: Sitting, Cuff Size: Large)   Pulse 81   Temp 98.1 F (36.7  C) (Temporal)   Resp 18   Ht '5\' 9"'  (1.753 m)   Wt (!) 303 lb 9.6 oz (137.7 kg)   SpO2 96%   BMI 44.83 kg/m  Wt Readings from Last 3 Encounters:  10/18/19 (!) 303 lb 9.6 oz (137.7 kg)  10/11/18 283 lb 12.8 oz (128.7 kg)  07/20/18 284 lb (128.8 kg)     Lab Results  Component Value Date   WBC 8.8 04/06/2018   HGB 15.5 04/06/2018   HCT 44.4 04/06/2018   PLT 69 (L) 04/06/2018   GLUCOSE 88 04/06/2018   CHOL 156 04/06/2018   TRIG 214 (H) 04/06/2018   HDL 29 (L) 04/06/2018   LDLDIRECT 141.4 08/01/2013   LDLCALC 96 04/06/2018   ALT 25 04/06/2018   AST 21 04/06/2018   NA 143 04/06/2018   K 4.7 04/06/2018   CL 107 04/06/2018   CREATININE 1.09 04/06/2018   BUN 15 04/06/2018   CO2 23 04/06/2018   TSH 0.89 08/28/2018   HGBA1C 5.1 04/06/2018   MICROALBUR 1.4 10/10/2014    DG Chest 2 View  Result Date: 01/09/2019 CLINICAL DATA:  Productive cough 2 weeks. EXAM: CHEST - 2 VIEW COMPARISON:  12/02/2013 FINDINGS: Lungs are hypoinflated without consolidation or effusion. Cardiomediastinal silhouette and remainder of the exam is unchanged. IMPRESSION: Hypoinflation without acute cardiopulmonary disease. Electronically Signed   By: Marin Olp M.D.   On: 01/09/2019 11:37    ekg-- LAD,  NS T wave abnormality  Assessment & Plan:  Plan  I have discontinued Aaric Kennerly's azithromycin, promethazine-dextromethorphan, predniSONE, and benzonatate. I am also having him maintain his fenofibrate, escitalopram, levofloxacin, albuterol, beclomethasone, atorvastatin, levothyroxine, lisinopril, and amLODipine.  Meds ordered this encounter  Medications  . amLODipine (NORVASC) 5 MG tablet    Sig: Take 1 tablet (5 mg total) by mouth daily. Needs ov    Dispense:  90 tablet    Refill:  1    Problem List Items Addressed This Visit      Unprioritized   HTN (hypertension)    Well controlled, no changes to meds. Encouraged heart healthy diet such as the DASH diet and exercise as tolerated.        Relevant Medications   amLODipine (NORVASC) 5 MG tablet   Other Relevant Orders   Lipid panel   Comprehensive metabolic panel   Hyperlipidemia - Primary    Tolerating statin, encouraged heart healthy diet, avoid trans fats, minimize simple carbs and saturated fats. Increase exercise as tolerated      Relevant Medications   amLODipine (NORVASC) 5 MG tablet   Other Relevant Orders   Lipid panel   Comprehensive metabolic panel    Other Visit Diagnoses    Syncope, unspecified syncope type       Relevant Medications   amLODipine (NORVASC) 5 MG tablet  Other Relevant Orders   Ambulatory referral to Neurology   EKG 12-Lead (Completed)   US Carotid Bilateral   ECHOCARDIOGRAM COMPLETE   Snoring       Relevant Orders   Ambulatory referral to Neurology   Insomnia, unspecified type       Relevant Orders   Ambulatory referral to Neurology   Abnormal EKG       Relevant Orders   ECHOCARDIOGRAM COMPLETE      Follow-up: Return in about 6 months (around 04/16/2020), or if symptoms worsen or fail to improve, for annual exam, fasting.  Ann Held, DO

## 2019-10-18 NOTE — Patient Instructions (Signed)
COVID-19 Vaccine Information can be found at: [email or web address] For questions related to vaccine distribution or appointments, please email [email or web address] or call [phone].    Syncope  Syncope refers to a condition in which a person temporarily loses consciousness. Syncope may also be called fainting or passing out. It is caused by a sudden decrease in blood flow to the brain. Even though most causes of syncope are not dangerous, syncope can be a sign of a serious medical problem. Your health care provider may do tests to find the reason why you are having syncope. Signs that you may be about to faint include:  Feeling dizzy or light-headed.  Feeling nauseous.  Seeing all white or all black in your field of vision.  Having cold, clammy skin. If you faint, get medical help right away. Call your local emergency services (911 in the [place] Do not drive yourself to the hospital. Follow these instructions at home: Pay attention to any changes in your symptoms. Take these actions to stay safe and to help relieve your symptoms: Lifestyle  Do not drive, use machinery, or play sports until your health care provider says it is okay.  Do not drink alcohol.  Do not use any products that contain nicotine or tobacco, such as cigarettes and e-cigarettes. If you need help quitting, ask your health care provider.  Drink enough fluid to keep your urine pale yellow. General instructions  Take over-the-counter and prescription medicines only as told by your health care provider.  If you are taking blood pressure or heart medicine, get up slowly and take several minutes to sit and then stand. This can reduce dizziness or light-headedness.  Have someone stay with you until you feel stable.  If you start to feel like you might faint, lie down right away and raise (elevate) your feet above the level of your heart. Breathe deeply and  steadily. Wait until all the symptoms have passed.  Keep all follow-up visits as told by your health care provider. This is important. Get help right away if you:  Have a severe headache.  Faint once or repeatedly.  Have pain in your chest, abdomen, or back.  Have a very fast or irregular heartbeat (palpitations).  Have pain when you breathe.  Are bleeding from your mouth or rectum, or you have black or tarry stool.  Have a seizure.  Are confused.  Have trouble walking.  Have severe weakness.  Have vision problems. These symptoms may represent a serious problem that is an emergency. Do not wait to see if your symptoms will go away. Get medical help right away. Call your local emergency services (911 in the [place] Do not drive yourself to the hospital. Summary  Syncope refers to a condition in which a person temporarily loses consciousness. It is caused by a sudden decrease in blood flow to the brain.  Signs that you may be about to faint include dizziness, feeling light-headed, feeling nauseous, sudden vision changes, or cold, clammy skin.  Although most causes of syncope are not dangerous, syncope can be a sign of a serious medical problem. If you faint, get medical help right away. This information is not intended to replace advice given to you by your health care provider. Make sure you discuss any questions you have with your health care provider. Document Revised: 08/25/2017 Document Reviewed: 08/21/2017 Elsevier Patient Education  2020 [employer]  

## 2019-10-18 NOTE — Assessment & Plan Note (Signed)
Tolerating statin, encouraged heart healthy diet, avoid trans fats, minimize simple carbs and saturated fats. Increase exercise as tolerated 

## 2019-10-24 ENCOUNTER — Encounter: Payer: Self-pay | Admitting: Family Medicine

## 2019-10-28 ENCOUNTER — Ambulatory Visit (HOSPITAL_BASED_OUTPATIENT_CLINIC_OR_DEPARTMENT_OTHER)
Admission: RE | Admit: 2019-10-28 | Discharge: 2019-10-28 | Disposition: A | Discharge status: Home / Self Care | Payer: BC Managed Care – PPO | Source: Ambulatory Visit | Attending: Family Medicine | Admitting: Family Medicine

## 2019-10-28 ENCOUNTER — Other Ambulatory Visit: Payer: Self-pay

## 2019-10-28 DIAGNOSIS — R55 Syncope and collapse: Secondary | ICD-10-CM | POA: Insufficient documentation

## 2019-10-29 ENCOUNTER — Ambulatory Visit: Payer: BC Managed Care – PPO | Admitting: Neurology

## 2019-10-29 ENCOUNTER — Encounter: Payer: Self-pay | Admitting: Neurology

## 2019-10-29 VITALS — BP 127/87 | HR 90 | Temp 97.0°F | Ht 70.0 in | Wt 305.0 lb

## 2019-10-29 DIAGNOSIS — E039 Hypothyroidism, unspecified: Secondary | ICD-10-CM

## 2019-10-29 DIAGNOSIS — G4730 Sleep apnea, unspecified: Secondary | ICD-10-CM | POA: Diagnosis not present

## 2019-10-29 DIAGNOSIS — G4710 Hypersomnia, unspecified: Secondary | ICD-10-CM

## 2019-10-29 DIAGNOSIS — F17200 Nicotine dependence, unspecified, uncomplicated: Secondary | ICD-10-CM

## 2019-10-29 DIAGNOSIS — G4726 Circadian rhythm sleep disorder, shift work type: Secondary | ICD-10-CM | POA: Diagnosis not present

## 2019-10-29 DIAGNOSIS — R0683 Snoring: Secondary | ICD-10-CM | POA: Diagnosis not present

## 2019-10-29 DIAGNOSIS — Z72820 Sleep deprivation: Secondary | ICD-10-CM

## 2019-10-29 DIAGNOSIS — J40 Bronchitis, not specified as acute or chronic: Secondary | ICD-10-CM

## 2019-10-29 DIAGNOSIS — E7801 Familial hypercholesterolemia: Secondary | ICD-10-CM

## 2019-10-29 NOTE — Patient Instructions (Signed)

## 2019-10-29 NOTE — Progress Notes (Addendum)
SLEEP MEDICINE CLINIC    Provider:  Larey Seat, MD  Primary Care Physician:  Ann Held, DO [address] [place] [address]     Referring Provider: Claudette Laws [address] [place],  [place] [address]          Chief Complaint according to patient   Patient presents with:    . New Patient (Initial Visit)     Patient's wife has witnessed snoring and apnea, he wakes himself snoring.       HISTORY OF PRESENT ILLNESS:  Daniel Acevedo is a 44 y.o. year old White male patient and seen upon referral by Dr Roma Schanz, DO, on 10/29/2019.   I have the pleasure of seeing Daniel Acevedo on 10/29/2019, a right -handed  Caucasian male with a possible sleep disorder. He has a past medical history of CARPAL TUNNEL SYNDROME, BILATERAL (01/04/2010), DEPRESSIVE DISORDER (04/27/2010), HYPERLIPIDEMIA (01/26/2010), Hypertension, HYPOTHYROIDISM (03/15/2010), and THROMBOCYTOPENIA (01/12/2010). He is classified as morbidly obese -class 3, with a BMI of 43 kg/m2, he reported chronic cough, and is an active tobacco user.    Sleep relevant medical history: Nocturia: once at night, no Tonsillectomy, cervical spine or TB injuries, no ENT procedures.  Family medical /sleep history: no other family member with OSA, Insomnia and no sleep walkers.  Social history: The Patient is working as a Freight forwarder at [employer] and lives in a household with 5 persons, his wife and 3 kids. The family owns 3 dogs.  The patient currently works in days but as a Freight forwarder he covers some nights, too. His work hours are unpredictable. Tobacco use: 2 ppd. For many years.  ETOH use ; none. Caffeine intake in form of Coffee( no) Soda( no) Tea ( no), but drinks MONSTER energy drinks. Regular exercise is seasonal in form of lawn care/ yard work.    Sleep habits are as follows: The patient's dinner time is between 6.30 PM. The patient goes to bed at 10 PM and continues to sleep for 1-2  hours, wakes not for bathroom breaks but from choking, coughing.  If he is up at 3 AM he will not go back to bed, can't sleep again.    The preferred sleep position is prone , with the support of 1 pillow. Dreams are reportedly rare.  3.30 AM is the usual rise time. The patient wakes up spontaneously. He only allocated 5 hours for sleep, and this sleep time is interrupted.  He reports not feeling refreshed or restored in AM, with symptoms such as dry mouth, coughing, phlegm, but no morning headaches. There is always residual fatigue. He is sleep deprived.   Naps are taken infrequently, lasting from 15 to 20 minutes and are less refreshing than nocturnal sleep.    Review of Systems: Out of a complete 14 system review, the patient complains of only the following symptoms, and all other reviewed systems are negative.:  Fatigue, sleepiness , snoring, fragmented sleep, sleep deprived- restricted sleep pattern. Coughing, gasping, choking.   irregular work hors, sometimes night shift, 10-11 hours    How likely are you to doze in the following situations: 0 = not likely, 1 = slight chance, 2 = moderate chance, 3 = high chance   Sitting and Reading? Watching Television? Sitting inactive in a public place (theater or meeting)? As a passenger in a car for an hour without a break? Lying down in the afternoon  when circumstances permit? Sitting and talking to someone? Sitting quietly after lunch without alcohol? In a car, while stopped for a few minutes in traffic?   Total = 15/ 24 points   FSS endorsed at 48/ 63 points.   Social History   Socioeconomic History  . Marital status: Married    Spouse name: Not on file  . Number of children: Not on file  . Years of education: Not on file  . Highest education level: Not on file  Occupational History  . 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 Smoking status: Smoker, Current Status Unknown     Packs/day: 2.00    Years: 22.00    Pack years: 44.00  . Smokeless tobacco: Never Used  . Tobacco comment: pt has tried many otc--- and wellbutrin and chantix with no success  Substance and Sexual Activity  . Alcohol use: Yes    Alcohol/week: 0.0 standard drinks    Comment: occasional  . Drug use: No  . Sexual activity: Yes    Partners: Female  Other Topics Concern  . Not on file  Social History Narrative   Regular exercise-no      Social Determinants of Health   Financial Resource Strain:   . Difficulty of Paying Living Expenses: Not on file  Food Insecurity:   . Worried About Charity fundraiser in the Last Year: Not on file  . Ran Out of Food in the Last Year: Not on file  Transportation Needs:   . Lack of Transportation (Medical): Not on file  . Lack of Transportation (Non-Medical): Not on file  Physical Activity:   . Days of Exercise per Week: Not on file  . Minutes of Exercise per Session: Not on file  Stress:   . Feeling of Stress : Not on file  Social Connections:   . Frequency of Communication with Friends and Family: Not on file  . Frequency of Social Gatherings with Friends and Family: Not on file  . Attends Religious Services: Not on file  . Active Member of Clubs or Organizations: Not on file  . Attends Archivist Meetings: Not on file  . Marital Status: Not on file    Family History  Problem Relation Age of Onset  . Thyroid disease Mother        hypothyroidism  . Hypertension Mother   . Heart disease Father        cabg  . Diabetes Father   . Kidney disease Father   . Hypertension Father   . Hypertension Brother   . Heart disease Brother 33       MI  . Diabetes Brother   . Hypertension Brother   . Hyperlipidemia Brother     Past Medical History:  Diagnosis Date  . CARPAL TUNNEL SYNDROME, BILATERAL 01/04/2010  . DEPRESSIVE DISORDER 04/27/2010  . HYPERLIPIDEMIA 01/26/2010  . Hypertension   . HYPOTHYROIDISM 03/15/2010  . THROMBOCYTOPENIA  01/12/2010    Past Surgical History:  Procedure Laterality Date  . BONE MARROW BIOPSY       Current Outpatient Medications on File Prior to Visit  Medication Sig Dispense Refill  . amLODipine (NORVASC) 5 MG tablet Take 1 tablet (5 mg total) by mouth daily. Needs ov 90 tablet 1  . atorvastatin (LIPITOR) 40 MG tablet TAKE 1 TABLET BY MOUTH DAILY AT 6 PM. 90 tablet 1  . escitalopram (LEXAPRO) 20 MG tablet TAKE 1  TABLET BY MOUTH EVERY DAY 30 tablet 10  . fenofibrate 160 MG tablet TAKE 1 TABLET BY MOUTH EVERY DAY 30 tablet 1  . levothyroxine (SYNTHROID) 175 MCG tablet TAKE 1 TABLET (175 MCG TOTAL) BY MOUTH DAILY BEFORE BREAKFAST. 30 tablet 4  . lisinopril (ZESTRIL) 40 MG tablet Take 1 tablet (40 mg total) by mouth daily. Needs ov before any more refills 90 tablet 0   No current facility-administered medications on file prior to visit.    Physical exam:  Today's Vitals   10/29/19 1521  BP: 127/87  Pulse: 90  Temp: (!) 97 F (36.1 C)  Weight: (!) 305 lb (138.3 kg)  Height: '5\' 10"'  (1.778 m)   Body mass index is 43.76 kg/m.   Wt Readings from Last 3 Encounters:  10/29/19 (!) 305 lb (138.3 kg)  10/18/19 (!) 303 lb 9.6 oz (137.7 kg)  10/11/18 283 lb 12.8 oz (128.7 kg)     Ht Readings from Last 3 Encounters:  10/29/19 '5\' 10"'  (1.778 m)  10/18/19 '5\' 9"'  (1.753 m)  10/11/18 '5\' 9"'  (1.753 m)      General: The patient is awake, alert and appears not in acute distress. The patient is well groomed. Head: Normocephalic, atraumatic. Neck is supple. Mallampati 2- the uvula hangs low and is swollen, puffy- ,  neck circumference:18.25  inches . Nasal airflow patent.  Retrognathia is mild..  Dental status: intact.  Cardiovascular:  Regular rate and cardiac rhythm by pulse,  without distended neck veins. Respiratory: Lungs are clear to auscultation.  Skin:  Without evidence of ankle edema, or rash. Trunk: The patient's posture is erect.   Neurologic exam : The patient is awake and alert,  oriented to place and time.   Memory subjective described as intact.  Attention span & concentration ability appears normal.  Speech is fluent,  without  dysarthria, dysphonia or aphasia.  Mood and affect are appropriate.   Cranial nerves: no loss of smell or taste reported  Pupils are equal and briskly reactive to light. Funduscopic exam deferred.  Extraocular movements in vertical and horizontal planes were intact and without nystagmus. No Diplopia. Visual fields by finger perimetry are intact. Hearing was intact to soft voice and finger rubbing.   Facial sensation intact to fine touch. Facial motor strength is symmetric and tongue and uvula move midline.  Neck ROM : rotation, tilt and flexion extension were normal for age and shoulder shrug was symmetrical.    Motor exam:  Symmetric bulk, tone and ROM.   Normal tone without cog wheeling, symmetric grip strength . Sensory:  Fine touch, pinprick and vibration were tested  and  normal.  Proprioception tested in the upper extremities was normal.  Coordination: Rapid alternating movements in the fingers/hands were of normal speed.  The Finger-to-nose maneuver was intact without evidence of ataxia, dysmetria or tremor. Gait and station: Patient could rise unassisted from a seated position, walked without assistive device.  Stance is of normal width/ base and the patient turned with 3 steps.  Toe and heel walk were deferred.  Deep tendon reflexes: in the  upper and lower extremities are symmetric and intact.  Babinski response was deferred.      0)The patient had a choking episode in late December and passed out, vasovagal syncope. [place] was done yesterday. Had carotid dopplers ( negative) .  After spending a total time of 40 minutes face to face and additional time for physical and neurologic examination, review of laboratory studies,  personal  review of imaging studies, reports and results of other testing and review of referral  information / records as far as provided in visit, I have established the following assessments:  1)  The patient will need to be tested for OSA.  His wife has clearly stated that he has irregular nocturnal breathing, and there has been snoring but even woken himself up.   He also has a longstanding history of heavy smoking which could contribute to low oxygen levels at night and a higher tolerance for elevated CO2 levels as well. He may have overlap COPD/OSA disorder.   I would like to offer him support to quit smoking as I think that will also contribute greatly to a better night sleep.    2) Another risk factor is weight -current BMI is 43.7 which places him in the 3 degree of obesity. Obesity hypoventilation can be seen with obesity,  hypoventilating causes retention of CO2 and hypoxemia.   3) Energy drinks - he consumes plenty- an with it a lot of adenosine antagonists, the same mechanism that caffeine uses, only mediated by Taurin. In the strict sense he is not consuming caffeine,but something with a similar effect and longer metabolism.    As to his medications: I am not concerned that there is any kind of sleep apnea risk contribution as he is not on muscle relaxants or pain medication.  No tranquilizers.    I will order a home sleep test as well as an attended sleep study depending on what his insurance company will pre-authorize, if he should have a significant enough level of obstructive sleep apnea we will initiate either CPAP treatment or can meet and discuss further diagnostic steps after the sleep study.  I would like to offer him support to quit smoking as I think that will also contribute greatly to a better night sleep.     My Plan is to proceed with:  1) consider weight and wellness clinic-  2) smoking cessation , quit energy drinks.  3) Sleep hygiene- make room in your schedule for 7 hours of sleep!   HST and attended sleep study were both ordered.   I would like to  thank Carollee Herter, Alferd Apa, DO and [place] [place],  [place] [id number] for allowing me to meet with and to take care of this pleasant patient.  Electronically signed by: Larey Seat, MD 10/29/2019 3:30 PM  [facility] Board certified by The [employer] of Sleep Medicine and Diplomate of the [employer] of Sleep Medicine. Board certified In Neurology through the [place], Fellow of the [employer] of Neurology. Medical Director of [facility].

## 2019-11-12 ENCOUNTER — Other Ambulatory Visit: Payer: Self-pay | Admitting: Family Medicine

## 2019-11-12 DIAGNOSIS — E785 Hyperlipidemia, unspecified: Secondary | ICD-10-CM

## 2019-11-12 DIAGNOSIS — I10 Essential (primary) hypertension: Secondary | ICD-10-CM

## 2019-11-13 ENCOUNTER — Other Ambulatory Visit: Payer: Self-pay

## 2019-11-13 ENCOUNTER — Ambulatory Visit (HOSPITAL_BASED_OUTPATIENT_CLINIC_OR_DEPARTMENT_OTHER)
Admission: RE | Admit: 2019-11-13 | Discharge: 2019-11-13 | Disposition: A | Discharge status: Home / Self Care | Payer: BC Managed Care – PPO | Source: Ambulatory Visit | Attending: Family Medicine | Admitting: Family Medicine

## 2019-11-13 DIAGNOSIS — R9431 Abnormal electrocardiogram [ECG] [EKG]: Secondary | ICD-10-CM | POA: Diagnosis not present

## 2019-11-13 DIAGNOSIS — R55 Syncope and collapse: Secondary | ICD-10-CM

## 2019-11-13 NOTE — Progress Notes (Signed)
  Echocardiogram 2D Echocardiogram has been performed.  Sinda Du 11/13/2019, 11:33 AM

## 2019-11-14 ENCOUNTER — Other Ambulatory Visit: Payer: Self-pay | Admitting: Family Medicine

## 2019-11-14 DIAGNOSIS — I712 Thoracic aortic aneurysm, without rupture, unspecified: Secondary | ICD-10-CM

## 2019-11-25 ENCOUNTER — Institutional Professional Consult (permissible substitution): Payer: BC Managed Care – PPO | Admitting: Neurology

## 2019-11-25 ENCOUNTER — Other Ambulatory Visit: Payer: Self-pay

## 2019-11-25 ENCOUNTER — Telehealth: Payer: Self-pay | Admitting: Neurology

## 2019-11-25 ENCOUNTER — Institutional Professional Consult (permissible substitution): Payer: BC Managed Care – PPO | Admitting: Cardiothoracic Surgery

## 2019-11-25 ENCOUNTER — Ambulatory Visit (INDEPENDENT_AMBULATORY_CARE_PROVIDER_SITE_OTHER): Payer: BC Managed Care – PPO | Admitting: Neurology

## 2019-11-25 VITALS — BP 113/76 | HR 72 | Temp 97.7°F | Resp 20 | Ht 70.0 in | Wt 306.0 lb

## 2019-11-25 DIAGNOSIS — E039 Hypothyroidism, unspecified: Secondary | ICD-10-CM | POA: Diagnosis not present

## 2019-11-25 DIAGNOSIS — G4710 Hypersomnia, unspecified: Secondary | ICD-10-CM

## 2019-11-25 DIAGNOSIS — I712 Thoracic aortic aneurysm, without rupture: Secondary | ICD-10-CM | POA: Diagnosis not present

## 2019-11-25 DIAGNOSIS — J40 Bronchitis, not specified as acute or chronic: Secondary | ICD-10-CM

## 2019-11-25 DIAGNOSIS — I7121 Aneurysm of the ascending aorta, without rupture: Secondary | ICD-10-CM

## 2019-11-25 DIAGNOSIS — Z72820 Sleep deprivation: Secondary | ICD-10-CM

## 2019-11-25 DIAGNOSIS — E7801 Familial hypercholesterolemia: Secondary | ICD-10-CM

## 2019-11-25 DIAGNOSIS — F17200 Nicotine dependence, unspecified, uncomplicated: Secondary | ICD-10-CM

## 2019-11-25 DIAGNOSIS — G4730 Sleep apnea, unspecified: Secondary | ICD-10-CM

## 2019-11-25 NOTE — Telephone Encounter (Signed)
Patient had appointment today and did not need to be seen. Paid $30 copay at check-in. Will be credited to next visit.

## 2019-11-26 NOTE — Progress Notes (Signed)
[address]       [place] [address]             [place] REPORT  Referring Provider is Carollee Herter, Alferd Apa, * Primary Cardiologist is No primary care provider on file. PCP is Carollee Herter, Alferd Apa, DO  Chief Complaint  Patient presents with  . Thoracic Aortic Aneurysm    Surgical eval, ECHO 11/13/19, No CT scan's ?    HPI:  44 year old gentleman is referred for evaluation of an ascending aortic aneurysm determined by echocardiogram earlier this year.  He was in his usual state of good health until approximately 2 months ago when he had an episode of passing out at [employer].  Shortly after he regained consciousness.  He sought attention for this and work-up for stroke or TIA was performed.  This was in large measure negative.  However on echocardiogram a small ascending aortic aneurysm was seen.  He has no prior history of aneurysm.  There is no family history of aneurysm.  He is not syndromic.  He is asymptomatic from a cardiovascular standpoint.  He does continue to smoke and is currently employed.  Past Medical History:  Diagnosis Date  . CARPAL TUNNEL SYNDROME, BILATERAL 01/04/2010  . DEPRESSIVE DISORDER 04/27/2010  . HYPERLIPIDEMIA 01/26/2010  . Hypertension   . HYPOTHYROIDISM 03/15/2010  . THROMBOCYTOPENIA 01/12/2010    Past Surgical History:  Procedure Laterality Date  . BONE MARROW BIOPSY      Family History  Problem Relation Age of Onset  . Thyroid disease Mother        hypothyroidism  . Hypertension Mother   . Heart disease Father        cabg  . Diabetes Father   . Kidney disease Father   . Hypertension Father   . Hypertension Brother   . Heart disease Brother 90       MI  . Diabetes Brother   . Hypertension Brother   . Hyperlipidemia Brother     Social History   Socioeconomic History  . Marital status: Married    Spouse name: Not on file  . Number of children: Not on file  .  Years of education: Not on file  . Highest education level: Not on file  Occupational History  . 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 Smoking status: Smoker, Current Status Unknown    Packs/day: 2.00    Years: 22.00    Pack years: 44.00  . Smokeless tobacco: Never Used  . Tobacco comment: pt has tried many otc--- and wellbutrin and chantix with no success  Substance and Sexual Activity  . Alcohol use: Yes    Alcohol/week: 0.0 standard drinks    Comment: occasional  . Drug use: No  . Sexual activity: Yes    Partners: Female  Other Topics Concern  . Not on file  Social History Narrative   Regular exercise-no      Social Determinants of Health   Financial Resource Strain:   . Difficulty of Paying Living Expenses: Not on file  Food Insecurity:   . Worried About Charity fundraiser in the Last Year: Not on file  . Ran Out of Food in the Last Year: Not on file  Transportation Needs:   . Lack of Transportation (Medical): Not on file  . Lack of Transportation (Non-Medical):  Not on file  Physical Activity:   . Days of Exercise per Week: Not on file  . Minutes of Exercise per Session: Not on file  Stress:   . Feeling of Stress : Not on file  Social Connections:   . Frequency of Communication with Friends and Family: Not on file  . Frequency of Social Gatherings with Friends and Family: Not on file  . Attends Religious Services: Not on file  . Active Member of Clubs or Organizations: Not on file  . Attends Archivist Meetings: Not on file  . Marital Status: Not on file  Intimate Partner Violence:   . Fear of Current or Ex-Partner: Not on file  . Emotionally Abused: Not on file  . Physically Abused: Not on file  . Sexually Abused: Not on file    Current Outpatient Medications  Medication Sig Dispense Refill  . amLODipine (NORVASC) 5 MG tablet Take 1 tablet (5 mg total) by mouth daily. Needs ov 90 tablet 1  .  atorvastatin (LIPITOR) 40 MG tablet TAKE 1 TABLET BY MOUTH DAILY AT 6 PM. 30 tablet 5  . escitalopram (LEXAPRO) 20 MG tablet TAKE 1 TABLET BY MOUTH EVERY DAY 30 tablet 10  . fenofibrate 160 MG tablet TAKE 1 TABLET BY MOUTH EVERY DAY 30 tablet 1  . levothyroxine (SYNTHROID) 175 MCG tablet TAKE 1 TABLET (175 MCG TOTAL) BY MOUTH DAILY BEFORE BREAKFAST. 30 tablet 4  . lisinopril (ZESTRIL) 40 MG tablet TAKE 1 TABLET (40 MG TOTAL) BY MOUTH DAILY. NEEDS OV BEFORE ANY MORE REFILLS 30 tablet 2   No current facility-administered medications for this visit.    No Known Allergies    Review of Systems:   General:  Decreased energy, + weight gain 20 lbs in past few mos  Cardiac:  Denies chest pain with exertion/est, denies SOB with  exertion, otherwise per HPI   Respiratory:  Denies shortness of breath, history of bronchitis with bronchospasm; history of snoring and questionable sleep apnea; positive nonproductive cough  GI:   Endorses frequent heartburn,  GU:   No kidney disease  Vascular:  Negative  Neuro:   Per HPI with no definitive stroke  Musculoskeletal: Negative  Skin:   Negative  Psych:   Negative  Eyes:   Does wear glasses   ENT:   Negative  Hematologic:  Positive easy bruising,   Endocrine:  No diabetes, does not check CBG's at home     Physical Exam:   BP 113/76 (BP Location: Right Arm, Patient Position: Sitting, Cuff Size: Large)   Pulse 72   Temp 97.7 F (36.5 C) (Temporal)   Resp 20   Ht '5\' 10"'  (1.778 m)   Wt (!) 138.8 kg   SpO2 97% Comment: RA  BMI 43.91 kg/m   General:    well-appearing young man, no acute distress  HEENT:  Unremarkable   Neck:   no JVD, no bruits, no adenopathy  Chest:   clear to auscultation, symmetrical breath sounds, no wheezes, no rhonchi  CV:   RRR, no  murmur   Abdomen:  soft, non-tender, no masses  Extremities:  warm, well-perfused, pulses intact throughout, no LE edema  Rectal/GU  Deferred  Neuro:   Grossly non-focal and symmetrical  throughout  Skin:   Clean and dry, no rashes, no breakdown   Diagnostic Tests:  I have reviewed his available imaging including the transthoracic echocardiogram from 11/13/2019 and agree with the interpretation.   Impression:  44 year old gentleman with hypertension  and overweight who has been diagnosed with a small ascending aortic aneurysm by transthoracic echocardiography alone.  Will need further information to fully assess the risk this poses to him   Plan:  Obtain CT scan of aorta in the next few weeks.  This will more fully assess the nature of the ascending aorta.  He will follow up after the scan is completed.  In the meantime he is encouraged to continue strict blood pressure control and is once again counseled regarding tobacco cessation, this time for aneurysm protection.  He is counseled to present to the emergency room for any sudden and severe chest or back pain.   I spent in excess of 45 minutes during the conduct of this office consultation and >50% of this time involved direct face-to-face encounter with the patient for counseling and/or coordination of their care.          Level 3 Office Consult = 40 minutes         Level 4 Office Consult = 60 minutes         Level 5 Office Consult = 80 minutes  B. Murvin Natal, MD 11/26/2019 6:34 PM

## 2019-12-01 DIAGNOSIS — Z72820 Sleep deprivation: Secondary | ICD-10-CM | POA: Insufficient documentation

## 2019-12-01 DIAGNOSIS — G4710 Hypersomnia, unspecified: Secondary | ICD-10-CM | POA: Insufficient documentation

## 2019-12-01 NOTE — Progress Notes (Signed)
Summary & Diagnosis:   This HST revealed severe, most likely obstructive sleep apnea,  with an AHI of 33.9/h and non-REM sleep dependence. The RDI, an  indicator for snoring, was much higher at 40.6/h. and not  accentuated in REM sleep. No clinically relevant degree of  hypoxia was noted, and the heart rate trended towards  bradycardia. The most severe AHI was seen in right lateral sleep  position at AHI of 70/h.   Recommendations:    This degree of apnea is severe and will need therapy by positive  airway pressure. I will order an interface of the patient's  choice, he should have an active part considering his comfort. He  auto-titration capable CPAP device will offer heated humidity and  be set at 5-18 cm water with 3 cm EPR.   Interpreting Physician: Melvyn Novas, MD

## 2019-12-01 NOTE — Procedures (Signed)
Patient Information     First Name: Gio Last Name: Czaplicki ID: [id number]  Birth Date: 01-Jan-1976 Age: 44 Gender: Male  Referring Provider: Zola Button, Grayling Congress, DO BMI: 43.6 (W=304 lb, H=5' 10'')  Neck Circ.:  18 '' Epworth:  15/24   Sleep Study Information    Study Date: Nov 25, 2019 S/H/A Version: [id number] / 4.1.1528 / 77  History:    Xsavier Seeley is a right -handed Caucasian 44 year-old male with a medical history of CARPAL TUNNEL SYNDROME, BILATERAL (01/04/2010), DEPRESSIVE DISORDER (04/27/2010), HYPERLIPIDEMIA (01/26/2010), Hypertension, HYPOTHYROIDISM (03/15/2010), and THROMBOCYTOPENIA (01/12/2010).chronic cough, and he is an active tobacco user. He consumes energy drinks Actor). He struggles with obesity.  He presented for concerns of his wife, who witnessed snoring, apneas and he has woken himself.       Summary & Diagnosis:    This HST revealed severe, most likely obstructive sleep apnea, with an AHI of 33.9/h and non-REM sleep dependence. The RDI, an indicator for snoring, was much higher at 40.6/h. and not accentuated in REM sleep. No clinically relevant degree of hypoxia was noted, and the heart rate trended towards bradycardia. The most severe AHI was seen in right lateral sleep position at AHI of 70/h.   Recommendations:      This degree of apnea is severe and will need therapy by positive airway pressure. I will order an interface of the patient's choice, he should have an active part considering his comfort. He auto-titration capable CPAP device will offer heated humidity and be set at 5-18 cm water with 3 cm EPR.   Interpreting Physician: Melvyn Novas, MD              Sleep Summary  Oxygen Saturation Statistics   Start Study Time: End Study Time: Total Recording Time:  7:46:15 PM 3:05:16 AM 7 h, 19 min  Total Sleep Time % REM of Sleep Time:  5 h, 45 min 28.5    Mean: 94 Minimum: 90 Maximum: 99  Mean of Desaturations Nadirs (%):   93  Oxygen  Desaturation. %: 4-9 10-20 >20 Total  Events Number Total  76 100.0  0 0.0  0 0.0  76 100.0  Oxygen Saturation: <90 <=88 <85 <80 <70  Duration (minutes): Sleep % 0.0 0.0 0.0 0.0 0.0 0.0 0.0 0.0 0.0 0.0     Respiratory Indices      Total Events REM NREM All Night  pRDI:  230  pAHI:  192 ODI:  76  pAHIc:  24  % CSR: 0.0 35.1 24.0 10.5 10.0 42.8 37.9 14.6 3.1 40.6 33.9 13.4 4.7       Pulse Rate Statistics during Sleep (BPM)      Mean: 63 Minimum: 41 Maximum:  95    Indices are calculated using technically valid sleep time of 5 h, 40 min. Central-Indices are calculated using technically valid sleep time of  5  h, 5 min. pRDI/pAHI are calculated using 02 desaturations ? 3%  Body Position Statistics  Position Supine Prone Right Left Non-Supine  Sleep (min) 138.0 167.0 18.0 20.0 205.0  Sleep % 40.0 48.4 5.2 5.8 59.4  pRDI 53.5 27.8 70.0 28.1 31.6  pAHI 46.5 20.1 70.0 24.9 25.0  ODI 18.9 7.3 26.7 15.6 9.8     Snoring Statistics Snoring Level (dB) >40 >50 >60 >70 >80 >Threshold (45)  Sleep (min) 276.6 27.5 5.5 0.0 0.0 100.3  Sleep % 80.2 8.0 1.6 0.0 0.0 29.1    Mean: 44 dB

## 2019-12-01 NOTE — Addendum Note (Signed)
Addended by: Melvyn Novas on: 12/01/2019 05:19 PM   Modules accepted: Orders

## 2019-12-04 ENCOUNTER — Telehealth: Payer: Self-pay | Admitting: Neurology

## 2019-12-04 ENCOUNTER — Encounter: Payer: Self-pay | Admitting: Neurology

## 2019-12-04 NOTE — Telephone Encounter (Signed)
I called pt. I advised pt that Dr. Vickey Huger reviewed their sleep study results and found that pt has severe sleep apnea . Dr. Vickey Huger recommends that pt starts auto CPAP. I reviewed PAP compliance expectations with the pt. Pt is agreeable to starting a CPAP. I advised pt that an order will be sent to a DME, [facility], and [facility] will call the pt within about one week after they file with the pt's insurance. [facility] will show the pt how to use the machine, fit for masks, and troubleshoot the CPAP if needed. A follow up appt was made for insurance purposes with Butch Penny, NP on May 20,2021 at 1:30 pm. Pt verbalized understanding to arrive 15 minutes early and bring their CPAP. A letter with all of this information in it will be mailed to the pt as a reminder. I verified with the pt that the address we have on file is correct. Pt verbalized understanding of results. Pt had no questions at this time but was encouraged to call back if questions arise. I have sent the order to [facility] and have received confirmation that they have received the order.

## 2019-12-04 NOTE — Telephone Encounter (Signed)
Called patient to discuss sleep study results. No answer at this time. LVM for the patient to call back.   

## 2019-12-04 NOTE — Telephone Encounter (Signed)
-----   Message from Melvyn Novas, MD sent at 12/01/2019  5:19 PM EST ----- Summary & Diagnosis:   This HST revealed severe, most likely obstructive sleep apnea,  with an AHI of 33.9/h and non-REM sleep dependence. The RDI, an  indicator for snoring, was much higher at 40.6/h. and not  accentuated in REM sleep. No clinically relevant degree of  hypoxia was noted, and the heart rate trended towards  bradycardia. The most severe AHI was seen in right lateral sleep  position at AHI of 70/h.   Recommendations:    This degree of apnea is severe and will need therapy by positive  airway pressure. I will order an interface of the patient's  choice, he should have an active part considering his comfort. He  auto-titration capable CPAP device will offer heated humidity and  be set at 5-18 cm water with 3 cm EPR.   Interpreting Physician: Melvyn Novas, MD

## 2019-12-17 ENCOUNTER — Other Ambulatory Visit: Payer: Self-pay | Admitting: Family Medicine

## 2019-12-18 DIAGNOSIS — G4733 Obstructive sleep apnea (adult) (pediatric): Secondary | ICD-10-CM | POA: Diagnosis not present

## 2020-01-16 ENCOUNTER — Other Ambulatory Visit: Payer: Self-pay | Admitting: Internal Medicine

## 2020-01-18 DIAGNOSIS — G4733 Obstructive sleep apnea (adult) (pediatric): Secondary | ICD-10-CM | POA: Diagnosis not present

## 2020-02-03 ENCOUNTER — Other Ambulatory Visit: Payer: Self-pay | Admitting: *Deleted

## 2020-02-03 DIAGNOSIS — I712 Thoracic aortic aneurysm, without rupture, unspecified: Secondary | ICD-10-CM

## 2020-02-12 ENCOUNTER — Telehealth: Payer: Self-pay

## 2020-02-12 NOTE — Telephone Encounter (Addendum)
Attempted to call the patient re: cpap usage for initial compliance. Pt has 21/30 day usage.

## 2020-02-13 ENCOUNTER — Encounter: Payer: Self-pay | Admitting: Adult Health

## 2020-02-13 ENCOUNTER — Ambulatory Visit (INDEPENDENT_AMBULATORY_CARE_PROVIDER_SITE_OTHER): Payer: BC Managed Care – PPO | Admitting: Adult Health

## 2020-02-13 ENCOUNTER — Other Ambulatory Visit: Payer: Self-pay

## 2020-02-13 ENCOUNTER — Other Ambulatory Visit: Payer: Self-pay | Admitting: Family Medicine

## 2020-02-13 VITALS — BP 115/77 | HR 76 | Ht 70.0 in | Wt 305.0 lb

## 2020-02-13 DIAGNOSIS — Z9989 Dependence on other enabling machines and devices: Secondary | ICD-10-CM

## 2020-02-13 DIAGNOSIS — G4733 Obstructive sleep apnea (adult) (pediatric): Secondary | ICD-10-CM | POA: Diagnosis not present

## 2020-02-13 DIAGNOSIS — I10 Essential (primary) hypertension: Secondary | ICD-10-CM

## 2020-02-13 NOTE — Patient Instructions (Signed)

## 2020-02-13 NOTE — Progress Notes (Signed)
PATIENT: Daniel Acevedo DOB: 02-24-1976  REASON FOR VISIT: follow up HISTORY FROM: patient  HISTORY OF PRESENT ILLNESS: Today 02/13/20:  Daniel Acevedo is Daniel 44 year old Acevedo with Daniel history of obstructive sleep apnea on CPAP.  He returns today for follow-up.  His download indicates that he use his machine 21 out of 30 days for compliance of 70%.  He uses machine greater than 4 hours 14 days for compliance of 47%.  Residual AHI is 3.8 on 5 to 18 cm of water with EPR 3.  Leak in the 95th percentile is 9.9 L/min.  Reports that he is working Daniel lot of hours right now some nights he does not get more than 4 hours of sleep.  HISTORY (Copied from Dr.Dohmeier's note) Daniel Acevedo Daniel 44 y.o. year old [place]  Acevedo patient and seen here upon referralon 10/29/2019 .  I have the pleasure of seeing Daniel Acevedo today,Daniel Acevedo with Daniel possible sleep disorder. He  has Daniel past medical history of CARPAL TUNNEL SYNDROME, BILATERAL (01/04/2010), DEPRESSIVE DISORDER (04/27/2010), HYPERLIPIDEMIA (01/26/2010), Hypertension, HYPOTHYROIDISM (03/15/2010), and THROMBOCYTOPENIA (01/12/2010).chronic cough, active tobacco user.    Sleeprelevant medical history: Nocturia: once at night, no Tonsillectomy,  cervical spine  or TB injuries, no ENT procedures.   Familymedical /sleep history:no  other family member on CPAP with OSA,   Social history:Patient is working as Daniel Freight forwarder at [employer] and lives in Daniel household with 5 persons, wife and 3 kids. The family owns 3 dogs.  The patient currently works in days but as Daniel Freight forwarder he covers some nights, too. Pets are ** present. Tobacco use: 2 ppd.  ETOH use ; none , Caffeine intake in form of Coffee( no) Soda( no) Tea ( no)  But drinks MONSTER energy drinks. Regular exercise in form of lawn care. .    Sleep habits are as follows:The patient's dinner time is between 6.30 PM. The patient goes to bed at 10 PM and continues to sleep for 1-2 hours,  wakes not for  bathroom breaks, but from choking, coughing.  If he up at 3 Am he will not go back to bed.    The preferred sleep position is prone , with the support of 1 pillow. Dreams are reportedly rare.  3.30 AM is the usual rise time. The patient wakes up spontaneously.  He reports not feeling refreshed or restored in AM, with symptoms such as dry mouth, coughing, phlegm, but no morning headaches*. There is always residual fatigue.  Naps are taken in frequently, lasting from 15 to 20 minutes and are less refreshing than nocturnal sleep.   REVIEW OF SYSTEMS: Out of Daniel complete 14 system review of symptoms, the patient complains only of the following symptoms, and all other reviewed systems are negative.  Epworth sleepiness score 16   ALLERGIES: No Known Allergies  HOME MEDICATIONS: Outpatient Medications Prior to Visit  Medication Sig Dispense Refill  . amLODipine (NORVASC) 5 MG tablet Take 1 tablet (5 mg total) by mouth daily. Needs ov 90 tablet 1  . atorvastatin (LIPITOR) 40 MG tablet TAKE 1 TABLET BY MOUTH DAILY AT 6 PM. 30 tablet 5  . escitalopram (LEXAPRO) 20 MG tablet TAKE 1 TABLET BY MOUTH EVERY DAY 30 tablet 10  . fenofibrate 160 MG tablet TAKE 1 TABLET BY MOUTH EVERY DAY 30 tablet 1  . levothyroxine (SYNTHROID) 175 MCG tablet TAKE 1 TABLET (175 MCG TOTAL) BY MOUTH DAILY BEFORE BREAKFAST. 30 tablet 4  .  lisinopril (ZESTRIL) 40 MG tablet TAKE 1 TABLET (40 MG TOTAL) BY MOUTH DAILY. NEEDS OV BEFORE ANY MORE REFILLS 30 tablet 2   No facility-administered medications prior to visit.    PAST MEDICAL HISTORY: Past Medical History:  Diagnosis Date  . CARPAL TUNNEL SYNDROME, BILATERAL 01/04/2010  . DEPRESSIVE DISORDER 04/27/2010  . HYPERLIPIDEMIA 01/26/2010  . Hypertension   . HYPOTHYROIDISM 03/15/2010  . THROMBOCYTOPENIA 01/12/2010    PAST SURGICAL HISTORY: Past Surgical History:  Procedure Laterality Date  . BONE MARROW BIOPSY      FAMILY HISTORY: Family History  Problem  Relation Age of Onset  . Thyroid disease Mother        hypothyroidism  . Hypertension Mother   . Heart disease Father        cabg  . Diabetes Father   . Kidney disease Father   . Hypertension Father   . Hypertension Brother   . Heart disease Brother 42       MI  . Diabetes Brother   . Hypertension Brother   . Hyperlipidemia Brother     SOCIAL HISTORY: Social History   Socioeconomic History  . Marital status: Married    Spouse name: Not on file  . Number of children: Not on file  . Years of education: Not on file  . Highest education level: Not on file  Occupational History  . 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 Smoking status: Smoker, Current Status Unknown    Packs/day: 2.00    Years: 22.00    Pack years: 44.00  . Smokeless tobacco: Never Used  . Tobacco comment: pt has tried many otc--- and wellbutrin and chantix with no success  Substance and Sexual Activity  . Alcohol use: Yes    Alcohol/week: 0.0 standard drinks    Comment: occasional  . Drug use: No  . Sexual activity: Yes    Partners: Female  Other Topics Concern  . Not on file  Social History Narrative   Regular exercise-no      Social Determinants of Health   Financial Resource Strain:   . Difficulty of Paying Living Expenses:   Food Insecurity:   . Worried About Charity fundraiser in the Last Year:   . Arboriculturist in the Last Year:   Transportation Needs:   . Film/video editor (Medical):   Marland Kitchen Lack of Transportation (Non-Medical):   Physical Activity:   . Days of Exercise per Week:   . Minutes of Exercise per Session:   Stress:   . Feeling of Stress :   Social Connections:   . Frequency of Communication with Friends and Family:   . Frequency of Social Gatherings with Friends and Family:   . Attends Religious Services:   . Active Member of Clubs or Organizations:   . Attends Archivist Meetings:   Marland Kitchen Marital Status:     Intimate Partner Violence:   . Fear of Current or Ex-Partner:   . Emotionally Abused:   Marland Kitchen Physically Abused:   . Sexually Abused:       PHYSICAL EXAM  Vitals:   02/13/20 1309  BP: 115/77  Pulse: 76  Weight: (!) 305 lb (138.3 kg)  Height: '5\' 10"'  (1.778 m)   Body mass index is 43.76 kg/m.  Generalized: Well developed, in no acute distress  Chest: Lungs clear to auscultation bilaterally  Neurological examination  Mentation: Alert oriented to  time, place, history taking. Follows all commands speech and language fluent Cranial nerve II-XII: Extraocular movements were full, visual field were full on confrontational test Head turning and shoulder shrug  were normal and symmetric. Motor: The motor testing reveals 5 over 5 strength of all 4 extremities. Good symmetric motor tone is noted throughout.  Sensory: Sensory testing is intact to soft touch on all 4 extremities. No evidence of extinction is noted.  Gait and station: Gait is normal.    DIAGNOSTIC DATA (LABS, IMAGING, TESTING) - I reviewed patient records, labs, notes, testing and imaging myself where available.  Lab Results  Component Value Date   WBC 8.8 04/06/2018   HGB 15.5 04/06/2018   HCT 44.4 04/06/2018   MCV 82.4 04/06/2018   PLT 69 (L) 04/06/2018      Component Value Date/Time   NA 140 10/18/2019 1124   K 4.1 10/18/2019 1124   CL 104 10/18/2019 1124   CO2 27 10/18/2019 1124   GLUCOSE 94 10/18/2019 1124   BUN 14 10/18/2019 1124   CREATININE 0.97 10/18/2019 1124   CREATININE 1.09 04/06/2018 1705   CALCIUM 9.5 10/18/2019 1124   PROT 6.8 10/18/2019 1124   ALBUMIN 4.6 10/18/2019 1124   AST 20 10/18/2019 1124   ALT 26 10/18/2019 1124   ALKPHOS 120 (H) 10/18/2019 1124   BILITOT 0.8 10/18/2019 1124   GFRNONAA 62.64 01/11/2010 0839   Lab Results  Component Value Date   CHOL 168 10/18/2019   HDL 37.00 (L) 10/18/2019   LDLCALC 100 (H) 10/18/2019   LDLDIRECT 141.4 08/01/2013   TRIG 158.0 (H) 10/18/2019    CHOLHDL 5 10/18/2019   Lab Results  Component Value Date   HGBA1C 5.1 04/06/2018   Lab Results  Component Value Date   VITAMINB12 908 08/01/2013   Lab Results  Component Value Date   TSH 0.89 08/28/2018      ASSESSMENT AND PLAN 44 y.o. year old Acevedo  has Daniel past medical history of CARPAL TUNNEL SYNDROME, BILATERAL (01/04/2010), DEPRESSIVE DISORDER (04/27/2010), HYPERLIPIDEMIA (01/26/2010), Hypertension, HYPOTHYROIDISM (03/15/2010), and THROMBOCYTOPENIA (01/12/2010). here with:  Obstructive sleep apnea on CPAP   Download shows suboptimal compliance  Good treatment of apnea when he is using the machine  Encouraged the patient try to use machine nightly and greater than 4 hours each night  Follow-up in 6 months or sooner if needed   I spent 20 minutes of face-to-face and non-face-to-face time with patient.  This included previsit chart review, lab review, study review, order entry, electronic health record documentation, patient education.  Ward Givens, MSN, NP-C 02/13/2020, 1:15 PM [facility] [address], [place], [place] [address] [phone]

## 2020-02-13 NOTE — Telephone Encounter (Signed)
  Pt was contacted and appt was confirmed. Cpap data is sufficient.

## 2020-02-17 DIAGNOSIS — G4733 Obstructive sleep apnea (adult) (pediatric): Secondary | ICD-10-CM | POA: Diagnosis not present

## 2020-03-16 ENCOUNTER — Encounter: Payer: BC Managed Care – PPO | Admitting: Cardiothoracic Surgery

## 2020-03-16 ENCOUNTER — Ambulatory Visit
Admission: RE | Admit: 2020-03-16 | Discharge: 2020-03-16 | Disposition: A | Discharge status: Home / Self Care | Payer: BC Managed Care – PPO | Source: Ambulatory Visit | Attending: Cardiothoracic Surgery | Admitting: Cardiothoracic Surgery

## 2020-03-16 DIAGNOSIS — I712 Thoracic aortic aneurysm, without rupture, unspecified: Secondary | ICD-10-CM

## 2020-03-16 MED ORDER — IOPAMIDOL (ISOVUE-370) INJECTION 76%
75.0000 mL | Freq: Once | INTRAVENOUS | Status: AC | PRN
  Administered 2020-03-16: 75 mL via INTRAVENOUS

## 2020-03-17 ENCOUNTER — Encounter: Payer: BC Managed Care – PPO | Admitting: Cardiothoracic Surgery

## 2020-03-18 ENCOUNTER — Ambulatory Visit: Payer: BC Managed Care – PPO | Admitting: Cardiothoracic Surgery

## 2020-03-18 ENCOUNTER — Other Ambulatory Visit: Payer: Self-pay

## 2020-03-18 VITALS — BP 107/73 | HR 76 | Temp 98.1°F | Resp 20 | Ht 70.0 in | Wt 305.0 lb

## 2020-03-18 DIAGNOSIS — I7121 Aneurysm of the ascending aorta, without rupture: Secondary | ICD-10-CM

## 2020-03-18 DIAGNOSIS — I712 Thoracic aortic aneurysm, without rupture: Secondary | ICD-10-CM

## 2020-03-19 DIAGNOSIS — G4733 Obstructive sleep apnea (adult) (pediatric): Secondary | ICD-10-CM | POA: Diagnosis not present

## 2020-03-19 NOTE — Progress Notes (Signed)
      [address]       [place] [address]             [phone]     CARDIOTHORACIC SURGERY OFFICE NOTE  Referring Provider is Zola Button, Grayling Congress, * Primary Cardiologist is No primary care provider on file. PCP is Zola Button, Grayling Congress, DO   HPI:  44 yo man returns as outpatient for additional evaluation of asc aortic aneurysm which was discovered during w/u for transient neuro event earlier this year. He is asx. He has no chest pain. Is treated for HTN which appears well-controlled; continues to smoke.   Current Outpatient Medications  Medication Sig Dispense Refill  . amLODipine (NORVASC) 5 MG tablet Take 1 tablet (5 mg total) by mouth daily. Needs ov 90 tablet 1  . atorvastatin (LIPITOR) 40 MG tablet TAKE 1 TABLET BY MOUTH DAILY AT 6 PM. 30 tablet 5  . escitalopram (LEXAPRO) 20 MG tablet TAKE 1 TABLET BY MOUTH EVERY DAY 30 tablet 10  . fenofibrate 160 MG tablet TAKE 1 TABLET BY MOUTH EVERY DAY 30 tablet 1  . levothyroxine (SYNTHROID) 175 MCG tablet TAKE 1 TABLET (175 MCG TOTAL) BY MOUTH DAILY BEFORE BREAKFAST. 30 tablet 4  . lisinopril (ZESTRIL) 40 MG tablet TAKE 1 TABLET (40 MG TOTAL) BY MOUTH DAILY. NEEDS OV BEFORE ANY MORE REFILLS 30 tablet 2   No current facility-administered medications for this visit.      Physical Exam:   BP 107/73 (BP Location: Right Arm, Patient Position: Sitting, Cuff Size: Large)   Pulse 76   Temp 98.1 F (36.7 C) (Temporal)   Resp 20   Ht 5\' 10"  (1.778 m)   Wt (!) 138.3 kg   SpO2 97% Comment: RA  BMI 43.76 kg/m   General:  Well-appearing, NAD  Chest:   cta  CV:   rrr  Abdomen:  sntnd  Extremities:  No edema  Diagnostic Tests:  I have reviewed his available imaging studies including most recent CT and agree with findings/interpretation.   Impression:  44 yo man with 4.5 cm ascending aortic aneurysm, not currently meeting criteria for elective repair based on relatively small size and no other compelling  factors such as personal or FHx of aneurysmal disease  Plan:  F/u in 6 months with repeat CT scan Report to local ED for any severe chest or back pain Smoking cessation BP control  I spent in excess of 20 minutes during the conduct of this office consultation and >50% of this time involved direct face-to-face encounter with the patient for counseling and/or coordination of their care.  Level 2                 10 minutes Level 3                 15 minutes Level 4                 25 minutes Level 5                 40 minutes  B. 46, MD 03/19/2020 7:42 AM

## 2020-03-25 DIAGNOSIS — G4733 Obstructive sleep apnea (adult) (pediatric): Secondary | ICD-10-CM | POA: Diagnosis not present

## 2020-04-05 ENCOUNTER — Other Ambulatory Visit: Payer: Self-pay | Admitting: Internal Medicine

## 2020-04-18 DIAGNOSIS — G4733 Obstructive sleep apnea (adult) (pediatric): Secondary | ICD-10-CM | POA: Diagnosis not present

## 2020-05-05 ENCOUNTER — Other Ambulatory Visit: Payer: Self-pay

## 2020-05-05 ENCOUNTER — Ambulatory Visit: Payer: BC Managed Care – PPO | Admitting: Family Medicine

## 2020-05-05 ENCOUNTER — Encounter: Payer: Self-pay | Admitting: Family Medicine

## 2020-05-05 VITALS — BP 110/80 | HR 80 | Temp 97.9°F | Resp 18 | Ht 70.0 in | Wt 310.2 lb

## 2020-05-05 DIAGNOSIS — E039 Hypothyroidism, unspecified: Secondary | ICD-10-CM

## 2020-05-05 DIAGNOSIS — E785 Hyperlipidemia, unspecified: Secondary | ICD-10-CM

## 2020-05-05 DIAGNOSIS — I10 Essential (primary) hypertension: Secondary | ICD-10-CM

## 2020-05-05 DIAGNOSIS — F17200 Nicotine dependence, unspecified, uncomplicated: Secondary | ICD-10-CM

## 2020-05-05 DIAGNOSIS — R1013 Epigastric pain: Secondary | ICD-10-CM

## 2020-05-05 MED ORDER — LISINOPRIL 40 MG PO TABS: 40.0000 mg | ORAL_TABLET | Freq: Every day | ORAL | 1 refills | Status: DC

## 2020-05-05 MED ORDER — LEVOTHYROXINE SODIUM 175 MCG PO TABS: 175.0000 ug | ORAL_TABLET | Freq: Every day | ORAL | 1 refills | Status: DC

## 2020-05-05 MED ORDER — PANTOPRAZOLE SODIUM 40 MG PO TBEC: 40.0000 mg | DELAYED_RELEASE_TABLET | Freq: Every day | ORAL | 3 refills | Status: DC

## 2020-05-05 NOTE — Assessment & Plan Note (Signed)
Check labs con't meds-- he ran out a few days ago

## 2020-05-05 NOTE — Assessment & Plan Note (Signed)
Encouraged heart healthy diet, increase exercise, avoid trans fats, consider a krill oil cap daily 

## 2020-05-05 NOTE — Progress Notes (Signed)
Patient ID: Daniel Acevedo, male    DOB: [birth year]  Age: 44 y.o. MRN: [id number]    Subjective:  Subjective  HPI Daniel Acevedo presents for f/u bp and thyroid -- he has been out of his thyroid meds for a few days.  Pt also c/o heartburn -- he takes otc daily --  But it has stopped working   Review of Systems  Constitutional: Negative for appetite change, diaphoresis, fatigue and unexpected weight change.  Eyes: Negative for pain, redness and visual disturbance.  Respiratory: Negative for cough, chest tightness, shortness of breath and wheezing.   Cardiovascular: Negative for chest pain, palpitations and leg swelling.  Gastrointestinal: Negative for abdominal distention, constipation, diarrhea, nausea and vomiting.  Endocrine: Negative for cold intolerance, heat intolerance, polydipsia, polyphagia and polyuria.  Genitourinary: Negative for difficulty urinating, dysuria and frequency.  Neurological: Negative for dizziness, light-headedness, numbness and headaches.    History Past Medical History:  Diagnosis Date  . CARPAL TUNNEL SYNDROME, BILATERAL 01/04/2010  . DEPRESSIVE DISORDER 04/27/2010  . HYPERLIPIDEMIA 01/26/2010  . Hypertension   . HYPOTHYROIDISM 03/15/2010  . THROMBOCYTOPENIA 01/12/2010    He has a past surgical history that includes Bone marrow biopsy.   His family history includes Diabetes in his brother and father; Heart disease in his father; Heart disease (age of onset: 58) in his brother; Hyperlipidemia in his brother; Hypertension in his brother, brother, father, and mother; Kidney disease in his father; Thyroid disease in his mother.He reports that he has been smoking. He has a 44.00 pack-year smoking history. He has never used smokeless tobacco. He reports current alcohol use. He reports that he does not use drugs.  Current Outpatient Medications on File Prior to Visit  Medication Sig Dispense Refill  . amLODipine (NORVASC) 5 MG tablet Take 1 tablet (5 mg total) by mouth  daily. Needs ov 90 tablet 1  . atorvastatin (LIPITOR) 40 MG tablet TAKE 1 TABLET BY MOUTH DAILY AT 6 PM. 30 tablet 5  . escitalopram (LEXAPRO) 20 MG tablet TAKE 1 TABLET BY MOUTH EVERY DAY 30 tablet 10  . fenofibrate 160 MG tablet TAKE 1 TABLET BY MOUTH EVERY DAY 30 tablet 1   No current facility-administered medications on file prior to visit.     Objective:  Objective  Physical Exam Vitals and nursing note reviewed.  Constitutional:      General: He is sleeping.     Appearance: He is well-developed.  HENT:     Head: Normocephalic and atraumatic.  Eyes:     Pupils: Pupils are equal, round, and reactive to light.  Neck:     Thyroid: No thyromegaly.  Cardiovascular:     Rate and Rhythm: Normal rate and regular rhythm.     Heart sounds: No murmur heard.   Pulmonary:     Effort: Pulmonary effort is normal. No respiratory distress.     Breath sounds: Normal breath sounds. No wheezing or rales.  Chest:     Chest wall: No tenderness.  Musculoskeletal:        General: No tenderness.     Cervical back: Normal range of motion and neck supple.  Skin:    General: Skin is warm and dry.  Neurological:     Mental Status: He is oriented to person, place, and time.  Psychiatric:        Behavior: Behavior normal.        Thought Content: Thought content normal.        Judgment: Judgment normal.  BP 110/80 (BP Location: Right Arm, Patient Position: Sitting, Cuff Size: Large)   Pulse 80   Temp 97.9 F (36.6 C) (Oral)   Resp 18   Ht '5\' 10"'  (1.778 m)   Wt (!) 310 lb 3.2 oz (140.7 kg)   SpO2 96%   BMI 44.51 kg/m  Wt Readings from Last 3 Encounters:  05/05/20 (!) 310 lb 3.2 oz (140.7 kg)  03/18/20 (!) 305 lb (138.3 kg)  02/13/20 (!) 305 lb (138.3 kg)     Lab Results  Component Value Date   WBC 8.8 04/06/2018   HGB 15.5 04/06/2018   HCT 44.4 04/06/2018   PLT 69 (L) 04/06/2018   GLUCOSE 94 10/18/2019   CHOL 168 10/18/2019   TRIG 158.0 (H) 10/18/2019   HDL 37.00 (L)  10/18/2019   LDLDIRECT 141.4 08/01/2013   LDLCALC 100 (H) 10/18/2019   ALT 26 10/18/2019   AST 20 10/18/2019   NA 140 10/18/2019   K 4.1 10/18/2019   CL 104 10/18/2019   CREATININE 0.97 10/18/2019   BUN 14 10/18/2019   CO2 27 10/18/2019   TSH 0.89 08/28/2018   HGBA1C 5.1 04/06/2018   MICROALBUR 1.4 10/10/2014    CT ANGIO CHEST AORTA W/CM & OR WO/CM  Result Date: 03/16/2020 CLINICAL DATA:  Thoracic aortic aneurysm.  History of smoking. Creatinine was obtained on site at [facility] at [address] Results: Creatinine 1.2 mg/dL. EXAM: CT ANGIOGRAPHY CHEST WITH CONTRAST TECHNIQUE: Multidetector CT imaging of the chest was performed using the standard protocol during bolus administration of intravenous contrast. Multiplanar CT image reconstructions and MIPs were obtained to evaluate the vascular anatomy. CONTRAST:  63m ISOVUE-370 IOPAMIDOL (ISOVUE-370) INJECTION 76% COMPARISON:  Chest radiographs 01/09/2019 FINDINGS: Cardiovascular: Thoracic aortic diameters as follows: 3.6 cm at the sinuses of Valsalva, 3.0 cm at the sinotubular junction, 4.3 cm in the mid ascending aorta, 3.1 cm in the proximal aortic arch, 2.3 cm in the proximal descending aorta, and 2.1 cm at the hiatus. No aortic dissection or significant atherosclerosis. Normal variant aortic arch branching pattern with the left vertebral artery arising from the arch. Normal heart size. No pericardial effusion. Mediastinum/Nodes: No enlarged axillary, mediastinal, or hilar lymph nodes. Unremarkable esophagus and thyroid. Lungs/Pleura: No pleural effusion or pneumothorax. Punctate calcified nodule along the right minor fissure. 2 mm left upper lobe nodule (series 6, image 37). Subtle diffuse centrilobular ground-glass micronodularity. Upper Abdomen: Left upper pole renal scarring. Musculoskeletal: No acute osseous abnormality or suspicious osseous lesion. Multiple small Schmorl's nodes in the mid to lower thoracic spine. Review of  the MIP images confirms the above findings. IMPRESSION: 1. 4.3 cm ascending aortic aneurysm. Recommend annual imaging followup by CTA or MRA. This recommendation follows 2010 ACCF/AHA/AATS/ACR/ASA/SCA/SCAI/SIR/STS/SVM Guidelines for the Diagnosis and Management of Patients with Thoracic Aortic Disease. Circulation. 2010; 121:: D622-W979 Aortic aneurysm NOS (ICD10-I71.9) 2. Subtle diffuse centrilobular ground-glass micronodularity, query respiratory bronchiolitis. 3. 2 mm left upper lobe nodule. No follow-up needed if patient is low-risk. Non-contrast chest CT can be considered in 12 months if patient is high-risk. This recommendation follows the consensus statement: Guidelines for Management of Incidental Pulmonary Nodules Detected on CT Images: From the Fleischner Society 2017; Radiology 2017; 284:228-243. 4. Aortic Atherosclerosis (ICD10-I70.0). Electronically Signed   By: ALogan BoresM.D.   On: 03/16/2020 09:42     Assessment & Plan:  Plan  I am having CTheodosia Palingstart on pantoprazole. I am also having him maintain his fenofibrate, amLODipine, atorvastatin, escitalopram, lisinopril, and  levothyroxine.  Meds ordered this encounter  Medications  . lisinopril (ZESTRIL) 40 MG tablet    Sig: Take 1 tablet (40 mg total) by mouth daily. Needs ov before any more refills    Dispense:  90 tablet    Refill:  1  . levothyroxine (SYNTHROID) 175 MCG tablet    Sig: Take 1 tablet (175 mcg total) by mouth daily before breakfast.    Dispense:  90 tablet    Refill:  1  . pantoprazole (PROTONIX) 40 MG tablet    Sig: Take 1 tablet (40 mg total) by mouth daily.    Dispense:  30 tablet    Refill:  3    Problem List Items Addressed This Visit      Unprioritized   HTN (hypertension)   Relevant Medications   lisinopril (ZESTRIL) 40 MG tablet   Other Relevant Orders   Lipid panel   Comprehensive metabolic panel    Other Visit Diagnoses    Hypothyroidism, unspecified type    -  Primary   Relevant  Medications   levothyroxine (SYNTHROID) 175 MCG tablet   Other Relevant Orders   TSH   Dyspepsia       Relevant Medications   pantoprazole (PROTONIX) 40 MG tablet   Other Relevant Orders   H. pylori antibody, IgG      Follow-up: Return in about 2 months (around 07/05/2020), or if symptoms worsen or fail to improve, for f/u thyroid.  Ann Held, DO

## 2020-05-05 NOTE — Patient Instructions (Addendum)
DASH Eating Plan DASH stands for "Dietary Approaches to Stop Hypertension." The DASH eating plan is a healthy eating plan that has been shown to reduce high blood pressure (hypertension). It may also reduce your risk for type 2 diabetes, heart disease, and stroke. The DASH eating plan may also help with weight loss. What are tips for following this plan?  General guidelines  Avoid eating more than 2,300 mg (milligrams) of salt (sodium) a day. If you have hypertension, you may need to reduce your sodium intake to 1,500 mg a day.  Limit alcohol intake to no more than 1 drink a day for nonpregnant women and 2 drinks a day for men. One drink equals 12 oz of beer, 5 oz of wine, or 1 oz of hard liquor.  Work with your health care provider to maintain a healthy body weight or to lose weight. Ask what an ideal weight is for you.  Get at least 30 minutes of exercise that causes your heart to beat faster (aerobic exercise) most days of the week. Activities may include walking, swimming, or biking.  Work with your health care provider or diet and nutrition specialist (dietitian) to adjust your eating plan to your individual calorie needs. Reading food labels   Check food labels for the amount of sodium per serving. Choose foods with less than 5 percent of the Daily Value of sodium. Generally, foods with less than 300 mg of sodium per serving fit into this eating plan.  To find whole grains, look for the word "whole" as the first word in the ingredient list. Shopping  Buy products labeled as "low-sodium" or "no salt added."  Buy fresh foods. Avoid canned foods and premade or frozen meals. Cooking  Avoid adding salt when cooking. Use salt-free seasonings or herbs instead of table salt or sea salt. Check with your health care provider or pharmacist before using salt substitutes.  Do not fry foods. Cook foods using healthy methods such as baking, boiling, grilling, and broiling instead.  Cook with  heart-healthy oils, such as olive, canola, soybean, or sunflower oil. Meal planning  Eat a balanced diet that includes: ? 5 or more servings of fruits and vegetables each day. At each meal, try to fill half of your plate with fruits and vegetables. ? Up to 6-8 servings of whole grains each day. ? Less than 6 oz of lean meat, poultry, or fish each day. A 3-oz serving of meat is about the same size as a deck of cards. One egg equals 1 oz. ? 2 servings of low-fat dairy each day. ? A serving of nuts, seeds, or beans 5 times each week. ? Heart-healthy fats. Healthy fats called Omega-3 fatty acids are found in foods such as flaxseeds and coldwater fish, like sardines, salmon, and mackerel.  Limit how much you eat of the following: ? Canned or prepackaged foods. ? Food that is high in trans fat, such as fried foods. ? Food that is high in saturated fat, such as fatty meat. ? Sweets, desserts, sugary drinks, and other foods with added sugar. ? Full-fat dairy products.  Do not salt foods before eating.  Try to eat at least 2 vegetarian meals each week.  Eat more home-cooked food and less restaurant, buffet, and fast food.  When eating at a restaurant, ask that your food be prepared with less salt or no salt, if possible. What foods are recommended? The items listed may not be a complete list. Talk with your dietitian about   what dietary choices are best for you. Grains Whole-grain or whole-wheat bread. Whole-grain or whole-wheat pasta. Brown rice. Oatmeal. Quinoa. Bulgur. Whole-grain and low-sodium cereals. Pita bread. Low-fat, low-sodium crackers. Whole-wheat flour tortillas. Vegetables Fresh or frozen vegetables (raw, steamed, roasted, or grilled). Low-sodium or reduced-sodium tomato and vegetable juice. Low-sodium or reduced-sodium tomato sauce and tomato paste. Low-sodium or reduced-sodium canned vegetables. Fruits All fresh, dried, or frozen fruit. Canned fruit in natural juice (without  added sugar). Meat and other protein foods Skinless chicken or turkey. Ground chicken or turkey. Pork with fat trimmed off. Fish and seafood. Egg whites. Dried beans, peas, or lentils. Unsalted nuts, nut butters, and seeds. Unsalted canned beans. Lean cuts of beef with fat trimmed off. Low-sodium, lean deli meat. Dairy Low-fat (1%) or fat-free (skim) milk. Fat-free, low-fat, or reduced-fat cheeses. Nonfat, low-sodium ricotta or cottage cheese. Low-fat or nonfat yogurt. Low-fat, low-sodium cheese. Fats and oils Soft margarine without trans fats. Vegetable oil. Low-fat, reduced-fat, or light mayonnaise and salad dressings (reduced-sodium). Canola, safflower, olive, soybean, and sunflower oils. Avocado. Seasoning and other foods Herbs. Spices. Seasoning mixes without salt. Unsalted popcorn and pretzels. Fat-free sweets. What foods are not recommended? The items listed may not be a complete list. Talk with your dietitian about what dietary choices are best for you. Grains Baked goods made with fat, such as croissants, muffins, or some breads. Dry pasta or rice meal packs. Vegetables Creamed or fried vegetables. Vegetables in a cheese sauce. Regular canned vegetables (not low-sodium or reduced-sodium). Regular canned tomato sauce and paste (not low-sodium or reduced-sodium). Regular tomato and vegetable juice (not low-sodium or reduced-sodium). Pickles. Olives. Fruits Canned fruit in a light or heavy syrup. Fried fruit. Fruit in cream or butter sauce. Meat and other protein foods Fatty cuts of meat. Ribs. Fried meat. Bacon. Sausage. Bologna and other processed lunch meats. Salami. Fatback. Hotdogs. Bratwurst. Salted nuts and seeds. Canned beans with added salt. Canned or smoked fish. Whole eggs or egg yolks. Chicken or turkey with skin. Dairy Whole or 2% milk, cream, and half-and-half. Whole or full-fat cream cheese. Whole-fat or sweetened yogurt. Full-fat cheese. Nondairy creamers. Whipped toppings.  Processed cheese and cheese spreads. Fats and oils Butter. Stick margarine. Lard. Shortening. Ghee. Bacon fat. Tropical oils, such as coconut, palm kernel, or palm oil. Seasoning and other foods Salted popcorn and pretzels. Onion salt, garlic salt, seasoned salt, table salt, and sea salt. Worcestershire sauce. Tartar sauce. Barbecue sauce. Teriyaki sauce. Soy sauce, including reduced-sodium. Steak sauce. Canned and packaged gravies. Fish sauce. Oyster sauce. Cocktail sauce. Horseradish that you find on the shelf. Ketchup. Mustard. Meat flavorings and tenderizers. Bouillon cubes. Hot sauce and Tabasco sauce. Premade or packaged marinades. Premade or packaged taco seasonings. Relishes. Regular salad dressings. Where to find more information:  National Heart, Lung, and Blood Institute: www.nhlbi.nih.gov  American Heart Association: www.heart.org Summary  The DASH eating plan is a healthy eating plan that has been shown to reduce high blood pressure (hypertension). It may also reduce your risk for type 2 diabetes, heart disease, and stroke.  With the DASH eating plan, you should limit salt (sodium) intake to 2,300 mg a day. If you have hypertension, you may need to reduce your sodium intake to 1,500 mg a day.  When on the DASH eating plan, aim to eat more fresh fruits and vegetables, whole grains, lean proteins, low-fat dairy, and heart-healthy fats.  Work with your health care provider or diet and nutrition specialist (dietitian) to adjust your eating plan to your   individual calorie needs. This information is not intended to replace advice given to you by your health care provider. Make sure you discuss any questions you have with your health care provider. Document Revised: 08/25/2017 Document Reviewed: 09/05/2016 Elsevier Patient Education  2020 Elsevier Inc. Food Choices for Gastroesophageal Reflux Disease, Adult When you have gastroesophageal reflux disease (GERD), the foods you eat and your  eating habits are very important. Choosing the right foods can help ease your discomfort. Think about working with a nutrition specialist (dietitian) to help you make good choices. What are tips for following this plan?  Meals  Choose healthy foods that are low in fat, such as fruits, vegetables, whole grains, low-fat dairy products, and lean meat, fish, and poultry.  Eat small meals often instead of 3 large meals a day. Eat your meals slowly, and in a place where you are relaxed. Avoid bending over or lying down until 2-3 hours after eating.  Avoid eating meals 2-3 hours before bed.  Avoid drinking a lot of liquid with meals.  Cook foods using methods other than frying. Bake, grill, or broil food instead.  Avoid or limit: ? Chocolate. ? Peppermint or spearmint. ? Alcohol. ? Pepper. ? Black and decaffeinated coffee. ? Black and decaffeinated tea. ? Bubbly (carbonated) soft drinks. ? Caffeinated energy drinks and soft drinks.  Limit high-fat foods such as: ? Fatty meat or fried foods. ? Whole milk, cream, butter, or ice cream. ? Nuts and nut butters. ? Pastries, donuts, and sweets made with butter or shortening.  Avoid foods that cause symptoms. These foods may be different for everyone. Common foods that cause symptoms include: ? Tomatoes. ? Oranges, lemons, and limes. ? Peppers. ? Spicy food. ? Onions and garlic. ? Vinegar. Lifestyle  Maintain a healthy weight. Ask your doctor what weight is healthy for you. If you need to lose weight, work with your doctor to do so safely.  Exercise for at least 30 minutes for 5 or more days each week, or as told by your doctor.  Wear loose-fitting clothes.  Do not smoke. If you need help quitting, ask your doctor.  Sleep with the head of your bed higher than your feet. Use a wedge under the mattress or blocks under the bed frame to raise the head of the bed. Summary  When you have gastroesophageal reflux disease (GERD), food and  lifestyle choices are very important in easing your symptoms.  Eat small meals often instead of 3 large meals a day. Eat your meals slowly, and in a place where you are relaxed.  Limit high-fat foods such as fatty meat or fried foods.  Avoid bending over or lying down until 2-3 hours after eating.  Avoid peppermint and spearmint, caffeine, alcohol, and chocolate. This information is not intended to replace advice given to you by your health care provider. Make sure you discuss any questions you have with your health care provider. Document Revised: 01/03/2019 Document Reviewed: 10/18/2016 Elsevier Patient Education  2020 Elsevier Inc.  

## 2020-05-05 NOTE — Assessment & Plan Note (Signed)
Pt still smoking  Not interested in stopping at this time

## 2020-05-06 LAB — COMPREHENSIVE METABOLIC PANEL
ALT: 25 U/L (ref 0–53)
AST: 28 U/L (ref 0–37)
Albumin: 4.7 g/dL (ref 3.5–5.2)
Alkaline Phosphatase: 106 U/L (ref 39–117)
BUN: 15 mg/dL (ref 6–23)
CO2: 24 mEq/L (ref 19–32)
Calcium: 9.6 mg/dL (ref 8.4–10.5)
Chloride: 105 mEq/L (ref 96–112)
Creatinine, Ser: 1.29 mg/dL (ref 0.40–1.50)
GFR: 61.15 mL/min (ref >60.00)
Glucose, Bld: 66 mg/dL — ABNORMAL LOW (ref 70–99)
Potassium: 3.9 mEq/L (ref 3.5–5.1)
Sodium: 141 mEq/L (ref 135–145)
Total Bilirubin: 0.6 mg/dL (ref 0.2–1.2)
Total Protein: 7.3 g/dL (ref 6.0–8.3)

## 2020-05-06 LAB — LDL CHOLESTEROL, DIRECT: Direct LDL: 123 mg/dL

## 2020-05-06 LAB — LIPID PANEL
Cholesterol: 204 mg/dL — ABNORMAL HIGH (ref 0–200)
HDL: 42.3 mg/dL (ref >39.00)
NonHDL: 161.53
Total CHOL/HDL Ratio: 5
Triglycerides: 206 mg/dL — ABNORMAL HIGH (ref 0.0–149.0)
VLDL: 41.2 mg/dL — ABNORMAL HIGH (ref 0.0–40.0)

## 2020-05-06 LAB — TSH: TSH: 197.96 u[IU]/mL — ABNORMAL HIGH (ref 0.35–4.50)

## 2020-05-06 LAB — H. PYLORI ANTIBODY, IGG: H Pylori IgG: NEGATIVE

## 2020-05-12 MED ORDER — FENOFIBRATE 160 MG PO TABS: 160.0000 mg | ORAL_TABLET | Freq: Every day | ORAL | 3 refills | Status: DC

## 2020-05-12 MED ORDER — ROSUVASTATIN CALCIUM 40 MG PO TABS: 40.0000 mg | ORAL_TABLET | Freq: Every day | ORAL | 3 refills | Status: DC

## 2020-05-12 NOTE — Addendum Note (Signed)
Addended byConrad Chacra D on: 05/12/2020 04:18 PM   Modules accepted: Orders

## 2020-05-19 DIAGNOSIS — G4733 Obstructive sleep apnea (adult) (pediatric): Secondary | ICD-10-CM | POA: Diagnosis not present

## 2020-06-02 ENCOUNTER — Telehealth: Payer: BC Managed Care – PPO | Admitting: Nurse Practitioner

## 2020-06-02 DIAGNOSIS — J029 Acute pharyngitis, unspecified: Secondary | ICD-10-CM | POA: Diagnosis not present

## 2020-06-02 NOTE — Progress Notes (Signed)
We are sorry that you are not feeling well.  Here is how we plan to help!  * you should still be tested for covid tomorrow as scheduled.  Your symptoms indicate a likely viral infection (Pharyngitis).   Pharyngitis is inflammation in the back of the throat which can cause a sore throat, scratchiness and sometimes difficulty swallowing.   Pharyngitis is typically caused by a respiratory virus and will just run its course.  Please keep in mind that your symptoms could last up to 10 days.  For throat pain, we recommend over the counter oral pain relief medications such as acetaminophen or aspirin, or anti-inflammatory medications such as ibuprofen or naproxen sodium.  Topical treatments such as oral throat lozenges or sprays may be used as needed.  Avoid close contact with loved ones, especially the very young and elderly.  Remember to wash your hands thoroughly throughout the day as this is the number one way to prevent the spread of infection and wipe down door knobs and counters with disinfectant.  After careful review of your answers, I would not recommend and antibiotic for your condition.  Antibiotics should not be used to treat conditions that we suspect are caused by viruses like the virus that causes the common cold or flu. However, some people can have Strep with atypical symptoms. You may need formal testing in clinic or office to confirm if your symptoms continue or worsen.  Providers prescribe antibiotics to treat infections caused by bacteria. Antibiotics are very powerful in treating bacterial infections when they are used properly.  To maintain their effectiveness, they should be used only when necessary.  Overuse of antibiotics has resulted in the development of super bugs that are resistant to treatment!    Home Care:  Only take medications as instructed by your medical team.  Do not drink alcohol while taking these medications.  A steam or ultrasonic humidifier can help congestion.   You can place a towel over your head and breathe in the steam from hot water coming from a faucet.  Avoid close contacts especially the very young and the elderly.  Cover your mouth when you cough or sneeze.  Always remember to wash your hands.  Get Help Right Away If:  You develop worsening fever or throat pain.  You develop a severe head ache or visual changes.  Your symptoms persist after you have completed your treatment plan.  Make sure you  Understand these instructions.  Will watch your condition.  Will get help right away if you are not doing well or get worse.  Your e-visit answers were reviewed by a board certified advanced clinical practitioner to complete your personal care plan.  Depending on the condition, your plan could have included both over the counter or prescription medications.  If there is a problem please reply  once you have received a response from your provider.  Your safety is important to [place].  If you have drug allergies check your prescription carefully.    You can use MyChart to ask questions about todays visit, request a non-urgent call back, or ask for a work or school excuse for 24 hours related to this e-Visit. If it has been greater than 24 hours you will need to follow up with your provider, or enter a new e-Visit to address those concerns.  You will get an e-mail in the next two days asking about your experience.  I hope that your e-visit has been valuable and will speed your  recovery. Thank you for using e-visits.   5-10 minutes spent reviewing and documenting in chart.

## 2020-06-19 DIAGNOSIS — G4733 Obstructive sleep apnea (adult) (pediatric): Secondary | ICD-10-CM | POA: Diagnosis not present

## 2020-07-19 DIAGNOSIS — G4733 Obstructive sleep apnea (adult) (pediatric): Secondary | ICD-10-CM | POA: Diagnosis not present

## 2020-08-10 ENCOUNTER — Other Ambulatory Visit: Payer: Self-pay | Admitting: Cardiothoracic Surgery

## 2020-08-10 DIAGNOSIS — I712 Thoracic aortic aneurysm, without rupture, unspecified: Secondary | ICD-10-CM

## 2020-08-17 NOTE — Progress Notes (Deleted)
PATIENT: Daniel Acevedo DOB: 07-11-1976  REASON FOR VISIT: follow up HISTORY FROM: patient  Virtual Visit via Video Note  I connected with Daniel Acevedo on 08/17/20 at  1:45 PM EST by a video enabled telemedicine application located remotely at [facility] and verified that I am speaking with the correct person using two identifiers who was located at their own home.   I discussed the limitations of evaluation and management by telemedicine and the availability of in person appointments. The patient expressed understanding and agreed to proceed.   PATIENT: Daniel Acevedo DOB: Mar 17, 1976  REASON FOR VISIT: follow up HISTORY FROM: patient  HISTORY OF PRESENT ILLNESS: Today 08/17/20  HISTORY 02/13/20:  Mr. Biss is a 44 year old male with a history of obstructive sleep apnea on CPAP.  He returns today for follow-up.  His download indicates that he use his machine 21 out of 30 days for compliance of 70%.  He uses machine greater than 4 hours 14 days for compliance of 47%.  Residual AHI is 3.8 on 5 to 18 cm of water with EPR 3.  Leak in the 95th percentile is 9.9 L/min.  Reports that he is working a lot of hours right now some nights he does not get more than 4 hours of sleep.   REVIEW OF SYSTEMS: Out of a complete 14 system review of symptoms, the patient complains only of the following symptoms, and all other reviewed systems are negative.  ALLERGIES: No Known Allergies  HOME MEDICATIONS: Outpatient Medications Prior to Visit  Medication Sig Dispense Refill  . amLODipine (NORVASC) 5 MG tablet Take 1 tablet (5 mg total) by mouth daily. Needs ov 90 tablet 1  . escitalopram (LEXAPRO) 20 MG tablet TAKE 1 TABLET BY MOUTH EVERY DAY 30 tablet 10  . fenofibrate 160 MG tablet Take 1 tablet (160 mg total) by mouth daily. 30 tablet 3  . levothyroxine (SYNTHROID) 175 MCG tablet Take 1 tablet (175 mcg total) by mouth daily before breakfast. 90 tablet 1  . lisinopril  (ZESTRIL) 40 MG tablet Take 1 tablet (40 mg total) by mouth daily. Needs ov before any more refills 90 tablet 1  . pantoprazole (PROTONIX) 40 MG tablet Take 1 tablet (40 mg total) by mouth daily. 30 tablet 3  . rosuvastatin (CRESTOR) 40 MG tablet Take 1 tablet (40 mg total) by mouth at bedtime. 30 tablet 3   No facility-administered medications prior to visit.    PAST MEDICAL HISTORY: Past Medical History:  Diagnosis Date  . CARPAL TUNNEL SYNDROME, BILATERAL 01/04/2010  . DEPRESSIVE DISORDER 04/27/2010  . HYPERLIPIDEMIA 01/26/2010  . Hypertension   . HYPOTHYROIDISM 03/15/2010  . THROMBOCYTOPENIA 01/12/2010    PAST SURGICAL HISTORY: Past Surgical History:  Procedure Laterality Date  . BONE MARROW BIOPSY      FAMILY HISTORY: Family History  Problem Relation Age of Onset  . Thyroid disease Mother        hypothyroidism  . Hypertension Mother   . Heart disease Father        cabg  . Diabetes Father   . Kidney disease Father   . Hypertension Father   . Hypertension Brother   . Heart disease Brother 67       MI  . Diabetes Brother   . Hypertension Brother   . Hyperlipidemia Brother     SOCIAL HISTORY: Social History   Socioeconomic History  . Marital status: Married    Spouse name: Not on file  . Number  of children: Not on file  . Years of education: Not on file  . Highest education level: Not on file  Occupational History  . 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 Smoking status: Smoker, Current Status Unknown    Packs/day: 2.00    Years: 22.00    Pack years: 44.00  . Smokeless tobacco: Never Used  . Tobacco comment: pt has tried many otc--- and wellbutrin and chantix with no success  Vaping Use  . Vaping Use: Never used  Substance and Sexual Activity  . Alcohol use: Yes    Alcohol/week: 0.0 standard drinks    Comment: occasional  . Drug use: No  . Sexual activity: Yes    Partners: Female  Other Topics Concern  .  Not on file  Social History Narrative   Regular exercise-no      Social Determinants of Health   Financial Resource Strain:   . Difficulty of Paying Living Expenses: Not on file  Food Insecurity:   . Worried About Charity fundraiser in the Last Year: Not on file  . Ran Out of Food in the Last Year: Not on file  Transportation Needs:   . Lack of Transportation (Medical): Not on file  . Lack of Transportation (Non-Medical): Not on file  Physical Activity:   . Days of Exercise per Week: Not on file  . Minutes of Exercise per Session: Not on file  Stress:   . Feeling of Stress : Not on file  Social Connections:   . Frequency of Communication with Friends and Family: Not on file  . Frequency of Social Gatherings with Friends and Family: Not on file  . Attends Religious Services: Not on file  . Active Member of Clubs or Organizations: Not on file  . Attends Archivist Meetings: Not on file  . Marital Status: Not on file  Intimate Partner Violence:   . Fear of Current or Ex-Partner: Not on file  . Emotionally Abused: Not on file  . Physically Abused: Not on file  . Sexually Abused: Not on file      PHYSICAL EXAM Generalized: Well developed, in no acute distress   Neurological examination  Mentation: Alert oriented to time, place, history taking. Follows all commands speech and language fluent Cranial nerve II-XII:Extraocular movements were full. Facial symmetry noted. uvula tongue midline. Head turning and shoulder shrug  were normal and symmetric. Motor: Good strength throughout subjectively per patient Sensory: Sensory testing is intact to soft touch on all 4 extremities subjectively per patient Coordination: Cerebellar testing reveals good finger-nose-finger  Gait and station: Patient is able to stand from a seated position. gait is normal.  Reflexes: UTA  DIAGNOSTIC DATA (LABS, IMAGING, TESTING) - I reviewed patient records, labs, notes, testing and imaging  myself where available.  Lab Results  Component Value Date   WBC 8.8 04/06/2018   HGB 15.5 04/06/2018   HCT 44.4 04/06/2018   MCV 82.4 04/06/2018   PLT 69 (L) 04/06/2018      Component Value Date/Time   NA 141 05/05/2020 1316   K 3.9 05/05/2020 1316   CL 105 05/05/2020 1316   CO2 24 05/05/2020 1316   GLUCOSE 66 (L) 05/05/2020 1316   BUN 15 05/05/2020 1316   CREATININE 1.29 05/05/2020 1316   CREATININE 1.09 04/06/2018 1705   CALCIUM 9.6 05/05/2020 1316   PROT 7.3 05/05/2020 1316   ALBUMIN 4.7 05/05/2020 1316  AST 28 05/05/2020 1316   ALT 25 05/05/2020 1316   ALKPHOS 106 05/05/2020 1316   BILITOT 0.6 05/05/2020 1316   GFRNONAA 62.64 01/11/2010 0839   Lab Results  Component Value Date   CHOL 204 (H) 05/05/2020   HDL 42.30 05/05/2020   LDLCALC 100 (H) 10/18/2019   LDLDIRECT 123.0 05/05/2020   TRIG 206.0 (H) 05/05/2020   CHOLHDL 5 05/05/2020   Lab Results  Component Value Date   HGBA1C 5.1 04/06/2018   Lab Results  Component Value Date   VITAMINB12 908 08/01/2013   Lab Results  Component Value Date   TSH 197.96 (H) 05/05/2020      ASSESSMENT AND PLAN 44 y.o. year old male  has a past medical history of CARPAL TUNNEL SYNDROME, BILATERAL (01/04/2010), DEPRESSIVE DISORDER (04/27/2010), HYPERLIPIDEMIA (01/26/2010), Hypertension, HYPOTHYROIDISM (03/15/2010), and THROMBOCYTOPENIA (01/12/2010). here with:  OSA on CPAP  . CPAP compliance excellent . Residual AHI is good . Encouraged patient to continue using CPAP nightly and > 4 hours each night . F/U in 1 year or sooner if needed  I spent 20 minutes of face-to-face and non-face-to-face time with patient.  This included previsit chart review, lab review, study review, order entry, electronic health record documentation, patient education.  Ward Givens, MSN, NP-C 08/17/2020, 4:44 PM [facility] [address], [place], [place] [address] [phone]

## 2020-08-18 ENCOUNTER — Other Ambulatory Visit: Payer: Self-pay | Admitting: Family Medicine

## 2020-08-18 ENCOUNTER — Telehealth: Payer: BC Managed Care – PPO | Admitting: Adult Health

## 2020-08-18 DIAGNOSIS — I10 Essential (primary) hypertension: Secondary | ICD-10-CM

## 2020-08-19 DIAGNOSIS — G4733 Obstructive sleep apnea (adult) (pediatric): Secondary | ICD-10-CM | POA: Diagnosis not present

## 2020-09-02 ENCOUNTER — Ambulatory Visit
Admission: RE | Admit: 2020-09-02 | Discharge: 2020-09-02 | Disposition: A | Discharge status: Home / Self Care | Payer: BC Managed Care – PPO | Source: Ambulatory Visit | Attending: Cardiothoracic Surgery | Admitting: Cardiothoracic Surgery

## 2020-09-02 DIAGNOSIS — I712 Thoracic aortic aneurysm, without rupture, unspecified: Secondary | ICD-10-CM

## 2020-09-02 DIAGNOSIS — R911 Solitary pulmonary nodule: Secondary | ICD-10-CM | POA: Diagnosis not present

## 2020-09-07 ENCOUNTER — Ambulatory Visit: Payer: BC Managed Care – PPO | Admitting: Cardiothoracic Surgery

## 2020-09-07 ENCOUNTER — Other Ambulatory Visit: Payer: Self-pay

## 2020-09-07 ENCOUNTER — Encounter: Payer: Self-pay | Admitting: Cardiothoracic Surgery

## 2020-09-07 VITALS — BP 116/69 | HR 100 | Resp 18 | Ht 70.0 in | Wt 313.0 lb

## 2020-09-07 DIAGNOSIS — I712 Thoracic aortic aneurysm, without rupture, unspecified: Secondary | ICD-10-CM

## 2020-09-08 ENCOUNTER — Other Ambulatory Visit: Payer: Self-pay | Admitting: Family Medicine

## 2020-09-08 DIAGNOSIS — R1013 Epigastric pain: Secondary | ICD-10-CM

## 2020-09-15 ENCOUNTER — Other Ambulatory Visit: Payer: Self-pay | Admitting: Family Medicine

## 2020-09-15 DIAGNOSIS — I10 Essential (primary) hypertension: Secondary | ICD-10-CM

## 2020-10-10 ENCOUNTER — Telehealth: Payer: BC Managed Care – PPO | Admitting: Nurse Practitioner

## 2020-10-10 DIAGNOSIS — K121 Other forms of stomatitis: Secondary | ICD-10-CM

## 2020-10-10 MED ORDER — TRIAMCINOLONE ACETONIDE 0.1 % MT PSTE: 1.0000 "application " | PASTE | Freq: Two times a day (BID) | OROMUCOSAL | 12 refills | Status: DC

## 2020-10-10 NOTE — Progress Notes (Signed)
E-Visit for Mouth Ulcers  We are sorry that you are not feeling well.  Here is how we plan to help!  Based on what you have shared with me, it appears that you do have mouth ulcer(s).     The following medications should decrease the discomfort and help with healing. Triamcinolone Dental Paste apply 3 times daily as needed for up to 3 days   Mouth ulcers are painful areas in the mouth and gums. These are also known as "canker sores".  They can occur anywhere inside the mouth. While mostly harmless, mouth ulcers can be extremely uncomfortable and may make it difficult to eat, drink, and brush your teeth.  You may have more than 1 ulcer and they can vary and change in size. Mouth ulcers are not contagious and should not be confused with cold sores.  Cold sores appear on the lip or around the outside of the mouth and often begin with a tingling, burning or itching sensation.   While the exact causes are unknown, some common causes and factors that may aggravate mouth ulcers include: . Genetics - Sometimes mouth ulcers run in families . High alcohol intake . Acidic foods such as citrus fruits like pineapple, grapefruit, orange fruits/juices, may aggravate mouth ulcers . Other foods high in acidity or spice such as coffee, chocolate, chips, pretzels, eggs, nuts, cheese . Quitting smoking . Injury caused by biting the tongue or inside of the cheek . Diet lacking in B-12, zinc, folic acid or iron . Male hormone shifts with menstruation . Excessive fatigue, emotional stress or anxiety Prevention: . Talk to your doctor if you are taking meds that are known to cause mouth ulcers such as:   Anti-inflammatory drugs (for example Ibuprofen, Naproxen sodium), pain killers, Beta blockers, Oral nicotine replacement drugs, Some street drugs (heroin).   . Avoid allowing any tablets to dissolve in your mouth that are meant to swallowed whole . Avoid foods/drinks that trigger or worsen symptoms . Keep your  mouth clean with daily brushing and flossing  Home Care: . The goal with treatment is to ease the pain where ulcers occur and help them heal as quickly as possible.  There is no medical treatment to prevent mouth ulcers from coming back or recurring.  . Avoid spicy and acidic foods . Eat soft foods and avoid rough, crunchy foods . Avoid chewing gum . Do not use toothpaste that contains sodium lauryl sulphite . Use a straw to drink which helps avoid liquids toughing the ulcers near the front of your mouth . Use a very soft toothbrush . If you have dentures or dental hardware that you feel is not fitting well or contributing to his, please see your dentist. . Use saltwater mouthwash which helps healing. Dissolve a  teaspoon of salt in a glass of warm water. Swish around your mouth and spit it out. This can be used as needed if it is soothing.   GET HELP RIGHT AWAY IF: . Persistent ulcers require checking IN PERSON (face to face). Any mouth lesion lasting longer than a month should be seen by your DENTIST as soon as possible for evaluation for possible oral cancer. . If you have a non-painful ulcer in 1 or more areas of your mouth . Ulcers that are spreading, are very large or particularly painful . Ulcers last longer than one week without improving on treatment . If you develop a fever, swollen glands and begin to feel unwell . Ulcers that developed after  starting a new medication MAKE SURE YOU:  Understand these instructions.  Will watch your condition.  Will get help right away if you are not doing well or get worse.  Your e-visit answers were reviewed by a board certified advanced clinical practitioner to complete your personal care plan.  Depending upon the condition, your plan could have included both over the counter or prescription medications.    Please review your pharmacy choice.  Be sure that the pharmacy you have chosen is open so that you can pick up your prescription now.   If there is a problem, you can message your provider in MyChart to have the prescription routed to another pharmacy.    Your safety is important to [place].  If you have drug allergies check our prescription carefully.  For the next 24 hours you can use MyChart to ask questions about today's visit, request a non-urgent call back, or ask for a work or school excuse from your e-visit provider.  You will get an email with a survey asking about your experience and to give [place] any feedback.  I hope that your e-visit has been valuable and will speed your recovery.  5-10 minutes spent reviewing and documenting in chart.

## 2020-10-14 NOTE — Progress Notes (Signed)
The patient presents for surveillance of his ascending aortic aneurysm.  He is a 45 year old man with hypertension.  He has a sub-5 cm ascending aortic aneurysm.  He states that his blood pressure is reasonably well controlled on lisinopril and Norvasc.  He denies chest pain or back pain.  Physical exam: BP 116/69 (BP Location: Left Arm, Patient Position: Sitting)   Pulse 100   Resp 18   Ht 5\' 10"  (1.778 m)   Wt (!) 142 kg   SpO2 96% Comment: RA with mask on  BMI 44.91 kg/m  Well-appearing man in no acute distress, mildly obese Clear to auscultation bilaterally  Regular rate and rhythm  soft nontender nondistended Extremities well perfused  Imaging: I have personally reviewed his CT scan of the chest from 09/02/2020 showing no significant change in his ascending aortic aneurysm  Impression: Stable ascending aortic aneurysm with reasonably well-controlled hypertension  Plan: Continue to observe strict blood pressure control Follow-up in 1 year for surveillance Report any severe chest or back pain to the local emergency department  Marykathleen Russi Z. 14/04/2020, MD [phone]

## 2020-10-21 ENCOUNTER — Other Ambulatory Visit: Payer: Self-pay | Admitting: Family Medicine

## 2020-10-21 DIAGNOSIS — I10 Essential (primary) hypertension: Secondary | ICD-10-CM

## 2020-11-29 ENCOUNTER — Other Ambulatory Visit: Payer: Self-pay | Admitting: Family Medicine

## 2020-11-29 DIAGNOSIS — E039 Hypothyroidism, unspecified: Secondary | ICD-10-CM

## 2021-01-02 ENCOUNTER — Other Ambulatory Visit: Payer: Self-pay | Admitting: Family Medicine

## 2021-01-02 DIAGNOSIS — E039 Hypothyroidism, unspecified: Secondary | ICD-10-CM

## 2021-01-05 ENCOUNTER — Other Ambulatory Visit: Payer: Self-pay | Admitting: Family Medicine

## 2021-01-05 DIAGNOSIS — I10 Essential (primary) hypertension: Secondary | ICD-10-CM

## 2021-01-05 NOTE — Telephone Encounter (Signed)
Okay for refills? Pt has not been seen since 04/2020

## 2021-01-05 NOTE — Telephone Encounter (Signed)
1 month each and he needs an ov

## 2021-01-13 ENCOUNTER — Other Ambulatory Visit: Payer: Self-pay | Admitting: Family Medicine

## 2021-01-13 DIAGNOSIS — I10 Essential (primary) hypertension: Secondary | ICD-10-CM

## 2021-01-14 ENCOUNTER — Other Ambulatory Visit: Payer: Self-pay | Admitting: Family Medicine

## 2021-01-14 DIAGNOSIS — I10 Essential (primary) hypertension: Secondary | ICD-10-CM

## 2021-01-14 MED ORDER — LISINOPRIL 40 MG PO TABS: 40.0000 mg | ORAL_TABLET | Freq: Every day | ORAL | 0 refills | Status: DC

## 2021-01-23 ENCOUNTER — Other Ambulatory Visit: Payer: Self-pay | Admitting: Family Medicine

## 2021-01-23 DIAGNOSIS — E039 Hypothyroidism, unspecified: Secondary | ICD-10-CM

## 2021-02-07 ENCOUNTER — Other Ambulatory Visit: Payer: Self-pay | Admitting: Family Medicine

## 2021-02-07 DIAGNOSIS — I10 Essential (primary) hypertension: Secondary | ICD-10-CM

## 2021-02-12 ENCOUNTER — Other Ambulatory Visit: Payer: Self-pay | Admitting: Family Medicine

## 2021-02-13 ENCOUNTER — Other Ambulatory Visit: Payer: Self-pay | Admitting: Family Medicine

## 2021-02-13 DIAGNOSIS — I10 Essential (primary) hypertension: Secondary | ICD-10-CM

## 2021-02-15 ENCOUNTER — Other Ambulatory Visit: Payer: Self-pay | Admitting: Family Medicine

## 2021-02-15 DIAGNOSIS — E039 Hypothyroidism, unspecified: Secondary | ICD-10-CM

## 2021-03-11 ENCOUNTER — Other Ambulatory Visit: Payer: Self-pay | Admitting: Family Medicine

## 2021-03-11 DIAGNOSIS — E039 Hypothyroidism, unspecified: Secondary | ICD-10-CM

## 2021-03-26 ENCOUNTER — Other Ambulatory Visit: Payer: Self-pay | Admitting: Family Medicine

## 2021-03-30 ENCOUNTER — Other Ambulatory Visit: Payer: Self-pay | Admitting: Family Medicine

## 2021-03-30 DIAGNOSIS — R1013 Epigastric pain: Secondary | ICD-10-CM

## 2021-04-02 ENCOUNTER — Other Ambulatory Visit: Payer: Self-pay | Admitting: Family Medicine

## 2021-04-02 DIAGNOSIS — E039 Hypothyroidism, unspecified: Secondary | ICD-10-CM

## 2021-04-30 ENCOUNTER — Other Ambulatory Visit: Payer: Self-pay | Admitting: Family Medicine

## 2021-04-30 DIAGNOSIS — R1013 Epigastric pain: Secondary | ICD-10-CM

## 2021-05-20 ENCOUNTER — Other Ambulatory Visit: Payer: Self-pay | Admitting: Family Medicine

## 2021-05-20 DIAGNOSIS — E039 Hypothyroidism, unspecified: Secondary | ICD-10-CM

## 2021-05-26 ENCOUNTER — Other Ambulatory Visit: Payer: Self-pay | Admitting: Family Medicine

## 2021-05-26 DIAGNOSIS — E039 Hypothyroidism, unspecified: Secondary | ICD-10-CM

## 2021-05-26 DIAGNOSIS — I10 Essential (primary) hypertension: Secondary | ICD-10-CM

## 2021-05-26 DIAGNOSIS — R1013 Epigastric pain: Secondary | ICD-10-CM

## 2021-05-30 ENCOUNTER — Other Ambulatory Visit: Payer: Self-pay | Admitting: Family Medicine

## 2021-05-30 DIAGNOSIS — R1013 Epigastric pain: Secondary | ICD-10-CM

## 2021-06-11 ENCOUNTER — Ambulatory Visit: Payer: BC Managed Care – PPO | Admitting: Family Medicine

## 2021-06-11 ENCOUNTER — Encounter: Payer: Self-pay | Admitting: Family Medicine

## 2021-06-11 ENCOUNTER — Other Ambulatory Visit: Payer: Self-pay

## 2021-06-11 VITALS — BP 118/90 | HR 94 | Temp 97.9°F | Resp 18 | Ht 70.0 in | Wt 318.6 lb

## 2021-06-11 DIAGNOSIS — R1013 Epigastric pain: Secondary | ICD-10-CM

## 2021-06-11 DIAGNOSIS — Z1159 Encounter for screening for other viral diseases: Secondary | ICD-10-CM

## 2021-06-11 DIAGNOSIS — I712 Thoracic aortic aneurysm, without rupture, unspecified: Secondary | ICD-10-CM | POA: Insufficient documentation

## 2021-06-11 DIAGNOSIS — E039 Hypothyroidism, unspecified: Secondary | ICD-10-CM

## 2021-06-11 DIAGNOSIS — F418 Other specified anxiety disorders: Secondary | ICD-10-CM

## 2021-06-11 DIAGNOSIS — E785 Hyperlipidemia, unspecified: Secondary | ICD-10-CM

## 2021-06-11 DIAGNOSIS — D696 Thrombocytopenia, unspecified: Secondary | ICD-10-CM

## 2021-06-11 DIAGNOSIS — I10 Essential (primary) hypertension: Secondary | ICD-10-CM | POA: Diagnosis not present

## 2021-06-11 HISTORY — DX: Thoracic aortic aneurysm, without rupture, unspecified: I71.20

## 2021-06-11 LAB — LIPID PANEL
Cholesterol: 184 mg/dL (ref 0–200)
HDL: 46 mg/dL (ref >39.00)
LDL Cholesterol: 110 mg/dL — ABNORMAL HIGH (ref 0–99)
NonHDL: 138.01
Total CHOL/HDL Ratio: 4
Triglycerides: 139 mg/dL (ref 0.0–149.0)
VLDL: 27.8 mg/dL (ref 0.0–40.0)

## 2021-06-11 LAB — COMPREHENSIVE METABOLIC PANEL
ALT: 23 U/L (ref 0–53)
AST: 22 U/L (ref 0–37)
Albumin: 4.6 g/dL (ref 3.5–5.2)
Alkaline Phosphatase: 83 U/L (ref 39–117)
BUN: 19 mg/dL (ref 6–23)
CO2: 26 mEq/L (ref 19–32)
Calcium: 10 mg/dL (ref 8.4–10.5)
Chloride: 105 mEq/L (ref 96–112)
Creatinine, Ser: 1.3 mg/dL (ref 0.40–1.50)
GFR: 67.59 mL/min (ref >60.00)
Glucose, Bld: 94 mg/dL (ref 70–99)
Potassium: 4.4 mEq/L (ref 3.5–5.1)
Sodium: 140 mEq/L (ref 135–145)
Total Bilirubin: 0.6 mg/dL (ref 0.2–1.2)
Total Protein: 7 g/dL (ref 6.0–8.3)

## 2021-06-11 LAB — HEMOGLOBIN A1C: Hgb A1c MFr Bld: 5.6 % (ref 4.6–6.5)

## 2021-06-11 LAB — TSH: TSH: 35.84 u[IU]/mL — ABNORMAL HIGH (ref 0.35–5.50)

## 2021-06-11 MED ORDER — LEVOTHYROXINE SODIUM 175 MCG PO TABS: ORAL_TABLET | ORAL | 1 refills | Status: DC

## 2021-06-11 MED ORDER — PANTOPRAZOLE SODIUM 40 MG PO TBEC: DELAYED_RELEASE_TABLET | ORAL | 3 refills | Status: DC

## 2021-06-11 MED ORDER — FENOFIBRATE 160 MG PO TABS: ORAL_TABLET | ORAL | 1 refills | Status: DC

## 2021-06-11 MED ORDER — ESCITALOPRAM OXALATE 20 MG PO TABS
20.0000 mg | ORAL_TABLET | Freq: Every day | ORAL | 3 refills | Status: AC
Start: 2021-06-11 — End: ?

## 2021-06-11 MED ORDER — AMLODIPINE BESYLATE 5 MG PO TABS: ORAL_TABLET | ORAL | 1 refills | Status: DC

## 2021-06-11 MED ORDER — LISINOPRIL 40 MG PO TABS: ORAL_TABLET | ORAL | 1 refills | Status: DC

## 2021-06-11 MED ORDER — ROSUVASTATIN CALCIUM 40 MG PO TABS: ORAL_TABLET | ORAL | 1 refills | Status: DC

## 2021-06-11 NOTE — Patient Instructions (Signed)

## 2021-06-11 NOTE — Assessment & Plan Note (Signed)
Encourage heart healthy diet such as MIND or DASH diet, increase exercise, avoid trans fats, simple carbohydrates and processed foods, consider a krill or fish or flaxseed oil cap daily.  °

## 2021-06-11 NOTE — Progress Notes (Signed)
Subjective:   By signing my name below, I, Daniel Acevedo, attest that this documentation has been prepared under the direction and in the presence of Ann Held, DO. 06/11/2021    Patient ID: Daniel Acevedo, male    DOB: Nov 16, 1975, 45 y.o.   MRN: [id number]  Chief Complaint  Patient presents with   Hypertension   Hyperlipidemia   Hypothyroidism   Follow-up    HPI Patient is in today for an office visit.  He mentions he has been having a good summer.  He is requesting a refill on  40 mg Crestor, 40 mg pantoprazole, 40 mg lisinopril, 175 mcg synthroid and 20 mg lexapro.   He still sees his cardiothoracic surgery for his aneurysm and sees him in November 2022.  He still smokes about 2 ppd and has not made any efforts to stop or reduce his smoking. He mentions he does not experience shortness of breath.  He is interested in starting the Healthy weight and wellness program.   Past Medical History:  Diagnosis Date   CARPAL TUNNEL SYNDROME, BILATERAL 01/04/2010   DEPRESSIVE DISORDER 04/27/2010   HYPERLIPIDEMIA 01/26/2010   Hypertension    HYPOTHYROIDISM 03/15/2010   THROMBOCYTOPENIA 01/12/2010    Past Surgical History:  Procedure Laterality Date   BONE MARROW BIOPSY      Family History  Problem Relation Age of Onset   Thyroid disease Mother        hypothyroidism   Hypertension Mother    Heart disease Father        cabg   Diabetes Father    Kidney disease Father    Hypertension Father    Hypertension Brother    Heart disease Brother 57       MI   Diabetes Brother    Hypertension Brother    Hyperlipidemia Brother     Social History   Socioeconomic History   Marital status: Married    Spouse name: Not on file   Number of children: Not on file   Years of education: Not on file   Highest education level: Not on file  Occupational History   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  Tobacco Use   Smoking status: Smoker,  Current Status Unknown    Packs/day: 2.00    Years: 23.00    Pack years: 46.00    Types: Cigarettes   Smokeless tobacco: Never   Tobacco comments:    pt has tried many otc--- and wellbutrin and chantix with no success  Vaping Use   Vaping Use: Never used  Substance and Sexual Activity   Alcohol use: Yes    Alcohol/week: 0.0 standard drinks    Comment: occasional   Drug use: No   Sexual activity: Yes    Partners: Female  Other Topics Concern   Not on file  Social History Narrative   Regular exercise-no      Social Determinants of Health   Financial Resource Strain: Not on file  Food Insecurity: Not on file  Transportation Needs: Not on file  Physical Activity: Not on file  Stress: Not on file  Social Connections: Not on file  Intimate Partner Violence: Not on file       No Known Allergies  Review of Systems  Constitutional:  Negative for chills, fever and malaise/fatigue.  HENT:  Negative for congestion and hearing loss.   Eyes:  Negative for blurred vision and discharge.  Respiratory:  Negative for cough, sputum production and shortness of breath.   Cardiovascular:  Negative for chest pain, palpitations and leg swelling.  Gastrointestinal:  Negative for abdominal pain, blood in stool, constipation, diarrhea, heartburn, nausea and vomiting.  Genitourinary:  Negative for dysuria, frequency, hematuria and urgency.  Musculoskeletal:  Negative for back pain, falls and myalgias.  Skin:  Negative for rash.  Neurological:  Negative for dizziness, sensory change, loss of consciousness, weakness and headaches.  Endo/Heme/Allergies:  Negative for environmental allergies. Does not bruise/bleed easily.  Psychiatric/Behavioral:  Negative for depression and suicidal ideas. The patient is not nervous/anxious and does not have insomnia.       Objective:    Physical Exam Vitals and nursing note reviewed.  Constitutional:      General: He is not in acute distress.     Appearance: Normal appearance. He is not ill-appearing.  HENT:     Head: Normocephalic and atraumatic.     Right Ear: Tympanic membrane, ear canal and external ear normal.     Left Ear: Tympanic membrane, ear canal and external ear normal.  Eyes:     Pupils: Pupils are equal, round, and reactive to light.  Cardiovascular:     Rate and Rhythm: Normal rate and regular rhythm.     Pulses: Normal pulses.     Heart sounds: No murmur heard.   No gallop.  Pulmonary:     Effort: Pulmonary effort is normal. No respiratory distress.     Breath sounds: No rhonchi.  Abdominal:     General: Bowel sounds are normal. There is no distension.     Palpations: Abdomen is soft.     Tenderness: There is no abdominal tenderness. There is no guarding.     Hernia: No hernia is present.  Musculoskeletal:     Cervical back: Neck supple.  Lymphadenopathy:     Cervical: No cervical adenopathy.  Skin:    General: Skin is warm and dry.  Neurological:     Mental Status: He is alert and oriented to person, place, and time.    BP 118/90 (BP Location: Right Arm, Patient Position: Sitting, Cuff Size: Large)   Pulse 94   Temp 97.9 F (36.6 C) (Oral)   Resp 18   Ht '5\' 10"'  (1.778 m)   Wt (!) 318 lb 9.6 oz (144.5 kg)   SpO2 97%   BMI 45.71 kg/m  Wt Readings from Last 3 Encounters:  06/11/21 (!) 318 lb 9.6 oz (144.5 kg)  09/07/20 (!) 313 lb (142 kg)  05/05/20 (!) 310 lb 3.2 oz (140.7 kg)    Diabetic Foot Exam - Simple   No data filed    Lab Results  Component Value Date   WBC 8.8 04/06/2018   HGB 15.5 04/06/2018   HCT 44.4 04/06/2018   PLT 69 (L) 04/06/2018   GLUCOSE 66 (L) 05/05/2020   CHOL 204 (H) 05/05/2020   TRIG 206.0 (H) 05/05/2020   HDL 42.30 05/05/2020   LDLDIRECT 123.0 05/05/2020   LDLCALC 100 (H) 10/18/2019   ALT 25 05/05/2020   AST 28 05/05/2020   NA 141 05/05/2020   K 3.9 05/05/2020   CL 105 05/05/2020   CREATININE 1.29 05/05/2020   BUN 15 05/05/2020   CO2 24 05/05/2020    TSH 197.96 (H) 05/05/2020   HGBA1C 5.1 04/06/2018   MICROALBUR 1.4 10/10/2014    Lab Results  Component Value Date   TSH 197.96 (H) 05/05/2020   Lab Results  Component Value Date  WBC 8.8 04/06/2018   HGB 15.5 04/06/2018   HCT 44.4 04/06/2018   MCV 82.4 04/06/2018   PLT 69 (L) 04/06/2018   Lab Results  Component Value Date   NA 141 05/05/2020   K 3.9 05/05/2020   CO2 24 05/05/2020   GLUCOSE 66 (L) 05/05/2020   BUN 15 05/05/2020   CREATININE 1.29 05/05/2020   BILITOT 0.6 05/05/2020   ALKPHOS 106 05/05/2020   AST 28 05/05/2020   ALT 25 05/05/2020   PROT 7.3 05/05/2020   ALBUMIN 4.7 05/05/2020   CALCIUM 9.6 05/05/2020   GFR 61.15 05/05/2020   Lab Results  Component Value Date   CHOL 204 (H) 05/05/2020   Lab Results  Component Value Date   HDL 42.30 05/05/2020   Lab Results  Component Value Date   LDLCALC 100 (H) 10/18/2019   Lab Results  Component Value Date   TRIG 206.0 (H) 05/05/2020   Lab Results  Component Value Date   CHOLHDL 5 05/05/2020   Lab Results  Component Value Date   HGBA1C 5.1 04/06/2018       Assessment & Plan:   Problem List Items Addressed This Visit       Unprioritized   THROMBOCYTOPENIA   Hyperlipidemia - Primary    Encourage heart healthy diet such as MIND or DASH diet, increase exercise, avoid trans fats, simple carbohydrates and processed foods, consider a krill or fish or flaxseed oil cap daily.       Relevant Medications   amLODipine (NORVASC) 5 MG tablet   fenofibrate 160 MG tablet   lisinopril (ZESTRIL) 40 MG tablet   rosuvastatin (CRESTOR) 40 MG tablet   Other Relevant Orders   Comprehensive metabolic panel   TSH   Lipid panel   Hypothyroidism (acquired)    Check labs today       Relevant Medications   levothyroxine (SYNTHROID) 175 MCG tablet   Thoracic aortic aneurysm without rupture (HCC)   Relevant Medications   amLODipine (NORVASC) 5 MG tablet   fenofibrate 160 MG tablet   lisinopril (ZESTRIL) 40  MG tablet   rosuvastatin (CRESTOR) 40 MG tablet   Other Visit Diagnoses     Essential hypertension       Relevant Medications   amLODipine (NORVASC) 5 MG tablet   fenofibrate 160 MG tablet   lisinopril (ZESTRIL) 40 MG tablet   rosuvastatin (CRESTOR) 40 MG tablet   Other Relevant Orders   Comprehensive metabolic panel   TSH   Lipid panel   Hypothyroidism, unspecified type       Relevant Medications   levothyroxine (SYNTHROID) 175 MCG tablet   Other Relevant Orders   TSH   Dyspepsia       Relevant Medications   pantoprazole (PROTONIX) 40 MG tablet   Depression with anxiety       Relevant Medications   escitalopram (LEXAPRO) 20 MG tablet   Morbid obesity (HCC)       Relevant Orders   Amb Ref to Medical Weight Management   Insulin, random   Hemoglobin A1c   Need for hepatitis C screening test       Relevant Orders   Hepatitis C antibody       Meds ordered this encounter  Medications   amLODipine (NORVASC) 5 MG tablet    Sig: TAKE 1 TABLET (5 MG TOTAL) BY MOUTH DAILY.    Dispense:  90 tablet    Refill:  1   fenofibrate 160 MG tablet    Sig:  1 po qd    Dispense:  90 tablet    Refill:  1   escitalopram (LEXAPRO) 20 MG tablet    Sig: Take 1 tablet (20 mg total) by mouth daily.    Dispense:  90 tablet    Refill:  3   levothyroxine (SYNTHROID) 175 MCG tablet    Sig: TAKE 1 TABLET BY MOUTH DAILY BEFORE BREAKFAST.    Dispense:  90 tablet    Refill:  1   lisinopril (ZESTRIL) 40 MG tablet    Sig: TAKE 1 TABLET EVERY DAY    Dispense:  90 tablet    Refill:  1   pantoprazole (PROTONIX) 40 MG tablet    Sig: 1 po qd    Dispense:  90 tablet    Refill:  3   rosuvastatin (CRESTOR) 40 MG tablet    Sig: TAKE 1 TABLET BY MOUTH EVERYDAY AT BEDTIME    Dispense:  90 tablet    Refill:  1    I,Daniel Acevedo,acting as a Education administrator for [employer], DO.,have documented all relevant documentation on the behalf of Ann Held, DO,as directed by  Ann Held, DO  while in the presence of Ann Held, [place], DO., personally preformed the services described in this documentation.  All medical record entries made by the scribe were at my direction and in my presence.  I have reviewed the chart and discharge instructions (if applicable) and agree that the record reflects my personal performance and is accurate and complete. 06/11/2021

## 2021-06-11 NOTE — Assessment & Plan Note (Signed)
Check labs today.

## 2021-06-14 LAB — INSULIN, RANDOM: Insulin: 17.4 u[IU]/mL

## 2021-06-14 LAB — HEPATITIS C ANTIBODY
Hepatitis C Ab: NONREACTIVE
SIGNAL TO CUT-OFF: 0.02 (ref <1.00)

## 2021-06-21 ENCOUNTER — Other Ambulatory Visit: Payer: Self-pay | Admitting: Family Medicine

## 2021-06-21 DIAGNOSIS — E785 Hyperlipidemia, unspecified: Secondary | ICD-10-CM

## 2021-06-22 MED ORDER — EZETIMIBE 10 MG PO TABS: 10.0000 mg | ORAL_TABLET | Freq: Every day | ORAL | 2 refills | Status: DC

## 2021-06-22 NOTE — Addendum Note (Signed)
Addended byConrad Alma D on: 06/22/2021 11:36 AM   Modules accepted: Orders

## 2021-08-06 ENCOUNTER — Other Ambulatory Visit: Payer: Self-pay | Admitting: Family Medicine

## 2021-08-06 DIAGNOSIS — I10 Essential (primary) hypertension: Secondary | ICD-10-CM

## 2021-08-11 ENCOUNTER — Other Ambulatory Visit: Payer: Self-pay | Admitting: *Deleted

## 2021-08-11 DIAGNOSIS — I712 Thoracic aortic aneurysm, without rupture, unspecified: Secondary | ICD-10-CM

## 2021-09-15 ENCOUNTER — Ambulatory Visit
Admission: RE | Admit: 2021-09-15 | Discharge: 2021-09-15 | Disposition: A | Discharge status: Home / Self Care | Payer: BC Managed Care – PPO | Source: Ambulatory Visit | Attending: Thoracic Surgery (Cardiothoracic Vascular Surgery) | Admitting: Thoracic Surgery (Cardiothoracic Vascular Surgery)

## 2021-09-15 DIAGNOSIS — I712 Thoracic aortic aneurysm, without rupture, unspecified: Secondary | ICD-10-CM

## 2021-09-15 DIAGNOSIS — I700 Atherosclerosis of aorta: Secondary | ICD-10-CM | POA: Diagnosis not present

## 2021-09-21 ENCOUNTER — Ambulatory Visit: Payer: BC Managed Care – PPO | Admitting: Physician Assistant

## 2021-09-21 ENCOUNTER — Other Ambulatory Visit: Payer: Self-pay

## 2021-09-21 VITALS — BP 110/72 | HR 110 | Ht 70.0 in | Wt 321.0 lb

## 2021-09-21 DIAGNOSIS — I7121 Aneurysm of the ascending aorta, without rupture: Secondary | ICD-10-CM

## 2021-09-21 NOTE — Patient Instructions (Addendum)
Patient is counseled regarding the importance of long term risk factor modification as they pertain to the presence of ischemic heart disease including avoiding the use of all tobacco products, dietary modifications and medical therapy for diabetes, cholesterol and lipid management, and regular exercise.    AVOID FLOUROQUINOLONES FOR BACTERIAL INFECTIONS AS USE CAN INCREASE RISK OF AORTIC DISSECTION  Stop smoking immediately and permanently.

## 2021-09-21 NOTE — Progress Notes (Signed)
[address]       [place] [address]             [phone]        Don Tiu [id number] 06/09/1976  History of Present Illness:  Daniel Acevedo is a 45 yo male with known history of HTN, Hypothyroidism, GERD, Hyperlipidemia, and Ascending Aortic Aneurysm.  He presents today for 1 year surveillance follow up.  Overall the patient continues to do well.  He denies chest pain and shortness of breath.  He continues to smoke 2 ppd for the past 30 years.   The patients blood pressure is well controlled on his current regimen.  He has a family history of heart disease.  Current Outpatient Medications on File Prior to Visit  Medication Sig Dispense Refill   amLODipine (NORVASC) 5 MG tablet TAKE 1 TABLET (5 MG TOTAL) BY MOUTH DAILY. 30 tablet 5   escitalopram (LEXAPRO) 20 MG tablet Take 1 tablet (20 mg total) by mouth daily. 90 tablet 3   ezetimibe (ZETIA) 10 MG tablet Take 1 tablet (10 mg total) by mouth daily. 30 tablet 2   fenofibrate 160 MG tablet 1 po qd 90 tablet 1   levothyroxine (SYNTHROID) 175 MCG tablet TAKE 1 TABLET BY MOUTH DAILY BEFORE BREAKFAST. 90 tablet 1   lisinopril (ZESTRIL) 40 MG tablet TAKE 1 TABLET EVERY DAY 90 tablet 1   pantoprazole (PROTONIX) 40 MG tablet 1 po qd 90 tablet 3   rosuvastatin (CRESTOR) 40 MG tablet TAKE 1 TABLET BY MOUTH EVERYDAY AT BEDTIME 90 tablet 1   triamcinolone (KENALOG) 0.1 % paste Use as directed 1 application in the mouth or throat 2 (two) times daily. 5 g 12   No current facility-administered medications on file prior to visit.     BP 110/72    Pulse (!) 110    Ht 5\' 10"  (1.778 m)    Wt (!) 321 lb (145.6 kg)    SpO2 95% Comment: RA   BMI 46.06 kg/m   Physical Exam  Gen: NAD, obese Heart:RRR Lungs:CTA  non tender, non-distended Ext: no edema Neuro:grossly intact  CTA Results:  IMPRESSION: 1. Unchanged dilation of the ascending thoracic aorta, measuring up to 4.2 cm. Recommend annual imaging  followup by CTA or MRA. This recommendation follows 2010 ACCF/AHA/AATS/ACR/ASA/SCA/SCAI/SIR/STS/SVM Guidelines for the Diagnosis and Management of Patients with Thoracic Aortic Disease. Circulation. 2010; 1212011. Aortic aneurysm NOS (ICD10-I71.9) 2. Mild coronary artery calcifications of the LAD and RCA, recommend ASCVD risk assessment. 3. Aortic aneurysm NOS (ICD10-I71.9) and aortic atherosclerosis (ICD10-I70.0).     Electronically Signed   By: : [id number] M.D.   On: 09/15/2021 08:44  A/P:  Ascending Aortic Aneurysm measuring 4.2 cm, which is unchanged from previous imaging studies HTN- on Norvasc, Lisinopril, well controlled Hyperlipidemia- on Crestor, Zetia Tobacco Abuse- 2 ppd smoker, patient educated on importance of smoking cessation to reduce his cardiac risk overall, tips provided via handout RTC in 1 year with CTA Aorta  Risk Modification:  Statin:  yes  Smoking cessation instruction/counseling given:  counseled patient on the dangers of tobacco use, advised patient to stop smoking, and reviewed strategies to maximize success  Patient was counseled on importance of Blood Pressure Control.  Despite Medical intervention if the patient notices persistently elevated blood pressure readings.  They are instructed to contact their Primary Care Physician  Please avoid use of Fluoroquinolones as this can potentially increase your risk of Aortic Rupture and/or  Dissection  Patient educated on signs and symptoms of Aortic Dissection, handout also provided in AVS  Daniel Procter, PA-C 09/21/21

## 2021-10-01 ENCOUNTER — Other Ambulatory Visit: Payer: Self-pay | Admitting: Family Medicine

## 2021-11-14 ENCOUNTER — Telehealth: Payer: BC Managed Care – PPO | Admitting: Family

## 2021-11-14 DIAGNOSIS — R300 Dysuria: Secondary | ICD-10-CM

## 2021-11-14 NOTE — Progress Notes (Signed)
E-Visit for Urinary Problems ? ?Based on what you shared with me, I feel your condition warrants further evaluation and I recommend that you be seen for a face to face office visit.  Male bladder infections are not very common.  We worry about prostate or kidney conditions.  The standard of care is to examine the abdomen and kidneys, and to do a urine and blood test to make sure that something more serious is not going on.  We recommend that you see a provider today.  If your doctor's office is closed [facility] has the following Urgent Cares: ? ?  ?NOTE: You will not be charged for this e-visit. ? ?If you are having a true medical emergency please call 911.   ? ?  ? For an urgent face to face visit, [facility] has six urgent care centers for your convenience:  ?  ? [facility] [place] ?Get Driving Directions ?[phone] ?[address] ?[place], [place] [address] ?  ? [facility] ([place]) ?Get Driving Directions ?[phone] ?[address] ?[place], [place] [address] ? ?[facility] ([place] [facility] [place]) ?Get Driving Directions ?[phone] ?[address] ?,  [place]  [address] ? ?[facility] ?Get Driving Directions ?[phone] ?[address] ?[place], [place] [address] ?  ?[facility] [place] ?Get Driving Directions  ?[phone] ?[address] ?[address] ?[place], [place] [address] ?  ?[facility] [place] ?Get Driving Directions ?[phone] ?[address] ?[place], [place] [address] ? ?Your MyChart E-visit questionnaire answers were reviewed by a board certified advanced clinical practitioner to complete your personal care plan based on your specific symptoms.  Thank you for using e-Visits. ?

## 2021-11-16 ENCOUNTER — Ambulatory Visit: Payer: BC Managed Care – PPO | Admitting: Family Medicine

## 2021-11-16 ENCOUNTER — Other Ambulatory Visit: Payer: Self-pay

## 2021-11-16 ENCOUNTER — Encounter: Payer: Self-pay | Admitting: Family Medicine

## 2021-11-16 VITALS — BP 118/76 | HR 75 | Temp 97.5°F | Ht 70.0 in | Wt 318.2 lb

## 2021-11-16 DIAGNOSIS — M545 Low back pain, unspecified: Secondary | ICD-10-CM

## 2021-11-16 LAB — POCT URINALYSIS DIPSTICK
Bilirubin, UA: NEGATIVE
Blood, UA: NEGATIVE
Glucose, UA: NEGATIVE
Ketones, UA: NEGATIVE
Leukocytes, UA: NEGATIVE
Nitrite, UA: NEGATIVE
Protein, UA: POSITIVE — AB
Spec Grav, UA: 1.02 (ref 1.010–1.025)
Urobilinogen, UA: 1 E.U./dL
pH, UA: 6 (ref 5.0–8.0)

## 2021-11-16 NOTE — Patient Instructions (Signed)

## 2021-11-16 NOTE — Progress Notes (Signed)
[facility] [address] [place] [address] Dept: [phone] Dept Fax: [phone]  Office Visit  Subjective:    Patient ID: Daniel Acevedo, male    DOB: 11-04-1975, 46 y.o..   MRN: [id number]  Chief Complaint  Patient presents with   Acute Visit    C/o having low back pain x 3-4 days.  No OTC meds taken.      History of Present Illness:  Patient is in today with a 3-4 day history of low back pain. He notes that this is in a band across the lower back. He denies any know trauma and does not recall doing any lifting before this gradually started. He denies fever, LE numbness, tingling or weakness. He has had no urinary urgency or frequency and no dysuria. He notes he had low back pain some years ago and was found to have a bladder infection. He has not tried any medication on his own to manage this.  Past Medical History: Patient Active Problem List   Diagnosis Date Noted   Thoracic aortic aneurysm without rupture 06/11/2021   Sleep deprivation 12/01/2019   Hypersomnia with sleep apnea 12/01/2019   Preventative health care 07/02/2017   HTN (hypertension) 10/09/2015   Bronchitis 12/04/2013   Otitis media 12/04/2013   Severe obesity (BMI >= 40) ([place]) 08/01/2013   Anxiety 11/01/2012   DEPRESSIVE DISORDER 04/27/2010   Hypothyroidism (acquired) 03/15/2010   Hyperlipidemia 01/26/2010   THROMBOCYTOPENIA 01/12/2010   TOBACCO USE 01/04/2010   CARPAL TUNNEL SYNDROME, BILATERAL 01/04/2010   SNORING 01/04/2010   COUGH 01/04/2010   Past Surgical History:  Procedure Laterality Date   BONE MARROW BIOPSY     Family History  Problem Relation Age of Onset   Thyroid disease Mother        hypothyroidism   Hypertension Mother    Heart disease Father        cabg   Diabetes Father    Kidney disease Father    Hypertension Father    Hypertension Brother    Heart disease Brother 6       MI   Diabetes Brother    Hypertension  Brother    Hyperlipidemia Brother    Outpatient Medications Prior to Visit  Medication Sig Dispense Refill   amLODipine (NORVASC) 5 MG tablet TAKE 1 TABLET (5 MG TOTAL) BY MOUTH DAILY. 30 tablet 5   escitalopram (LEXAPRO) 20 MG tablet Take 1 tablet (20 mg total) by mouth daily. 90 tablet 3   ezetimibe (ZETIA) 10 MG tablet TAKE 1 TABLET BY MOUTH EVERY DAY 30 tablet 2   fenofibrate 160 MG tablet 1 po qd 90 tablet 1   levothyroxine (SYNTHROID) 175 MCG tablet TAKE 1 TABLET BY MOUTH DAILY BEFORE BREAKFAST. 90 tablet 1   lisinopril (ZESTRIL) 40 MG tablet TAKE 1 TABLET EVERY DAY 90 tablet 1   pantoprazole (PROTONIX) 40 MG tablet 1 po qd 90 tablet 3   rosuvastatin (CRESTOR) 40 MG tablet TAKE 1 TABLET BY MOUTH EVERYDAY AT BEDTIME 90 tablet 1   triamcinolone (KENALOG) 0.1 % paste Use as directed 1 application in the mouth or throat 2 (two) times daily. 5 g 12   No facility-administered medications prior to visit.   No Known Allergies    Objective:   Today's Vitals   11/16/21 1326  BP: 118/76  Pulse: 75  Temp: (!) 97.5 F (36.4 C)  TempSrc: Temporal  SpO2: 98%  Weight: (!) 318 lb 3.2 oz (144.3  kg)  Height: _0  (1.778 m)   Body mass index is 45.66 kg/m.   General: Well developed, well nourished. No acute distress. Back: Straight. No tenderness. Pain indicated over lower back, just above iliac crests. Extremities: Strength 5/5 Neuro: Normal sensation bilaterally. Patellar and Achilles reflexes 1+ bilat. Psych: Alert and oriented x3. Normal mood and affect.  Health Maintenance Due  Topic Date Due   COVID-19 Vaccine (1) Never done   TETANUS/TDAP  11/09/2021     Lab results: Component Ref Range & Units 13:33 (11/16/21) 3 yr ago (10/11/18) 4 yr ago (06/29/17) 4 yr ago (03/24/17) 7 yr ago (10/10/14) 10 yr ago (11/10/11) 11 yr ago (10/21/10)  Color, UA  Yellow  Dark Yellow  yellow  yellow  yellow  yellow    Clarity, UA  cloudy  Cloudy  clear  clear  clear  clear    Glucose, UA  Negative Negative  Negative  neg R  neg R  neg R  neg R    Bilirubin, UA  negative  Negative  neg  neg  neg  neg    Ketones, UA  negative  Negative  neg  neg  neg  neg    Spec Grav, UA 1.010 - 1.025 1.020  1.015  >=1.030 Abnormal   1.015  1.025 R  1.020 R    Blood, UA  negative  Negative  neg  neg  neg  neg    pH, UA 5.0 - 8.0 6.0  6.0  6.0  7.5  5.5 R  6.0 R    Protein, UA Negative Positive Abnormal   Negative  neg R  neg R  neg R  trace R    Comment: 63m/dL  Urobilinogen, UA 0.2 or 1.0 E.U./dL 1.0  0.2  0.2  1.0  negative R  0.2 R  negative R   Nitrite, UA  negative  Negative  neg  neg  neg  neg    Leukocytes, UA Negative Negative  Small (1+) Abnormal   Negative  Negative  Negative R  Negative R    Appearance         Clear    Assessment & Plan:   1. Acute bilateral low back pain without sciatica Reviewed home management of low back pain. Likely muscle strain. Recommend he use heat/ice as needed for comfort. Handout on back stretches provided. Ibuprofen as needed for pain. Follow-up if not improving in 2 weeks.  - POCT Urinalysis Dipstick  SHaydee Salter MD

## 2021-12-24 ENCOUNTER — Other Ambulatory Visit: Payer: Self-pay | Admitting: Family Medicine

## 2022-01-14 ENCOUNTER — Other Ambulatory Visit: Payer: Self-pay | Admitting: Family Medicine

## 2022-01-14 DIAGNOSIS — E785 Hyperlipidemia, unspecified: Secondary | ICD-10-CM

## 2022-01-14 DIAGNOSIS — I10 Essential (primary) hypertension: Secondary | ICD-10-CM

## 2022-01-20 ENCOUNTER — Encounter: Payer: Self-pay | Admitting: Family Medicine

## 2022-01-20 ENCOUNTER — Telehealth: Payer: Self-pay

## 2022-01-20 ENCOUNTER — Ambulatory Visit: Payer: BC Managed Care – PPO | Admitting: Family Medicine

## 2022-01-20 ENCOUNTER — Ambulatory Visit (HOSPITAL_BASED_OUTPATIENT_CLINIC_OR_DEPARTMENT_OTHER)
Admission: RE | Admit: 2022-01-20 | Discharge: 2022-01-20 | Disposition: A | Discharge status: Home / Self Care | Payer: BC Managed Care – PPO | Source: Ambulatory Visit | Attending: Family Medicine | Admitting: Family Medicine

## 2022-01-20 VITALS — BP 110/90 | HR 72 | Temp 98.1°F | Resp 18 | Ht 70.0 in | Wt 318.2 lb

## 2022-01-20 DIAGNOSIS — E785 Hyperlipidemia, unspecified: Secondary | ICD-10-CM | POA: Diagnosis not present

## 2022-01-20 DIAGNOSIS — G8929 Other chronic pain: Secondary | ICD-10-CM | POA: Diagnosis not present

## 2022-01-20 DIAGNOSIS — M545 Low back pain, unspecified: Secondary | ICD-10-CM | POA: Insufficient documentation

## 2022-01-20 DIAGNOSIS — I10 Essential (primary) hypertension: Secondary | ICD-10-CM | POA: Diagnosis not present

## 2022-01-20 DIAGNOSIS — E039 Hypothyroidism, unspecified: Secondary | ICD-10-CM

## 2022-01-20 LAB — COMPREHENSIVE METABOLIC PANEL
ALT: 24 U/L (ref 0–53)
AST: 26 U/L (ref 0–37)
Albumin: 4.9 g/dL (ref 3.5–5.2)
Alkaline Phosphatase: 88 U/L (ref 39–117)
BUN: 15 mg/dL (ref 6–23)
CO2: 28 mEq/L (ref 19–32)
Calcium: 9.5 mg/dL (ref 8.4–10.5)
Chloride: 104 mEq/L (ref 96–112)
Creatinine, Ser: 1.2 mg/dL (ref 0.40–1.50)
GFR: 74.09 mL/min (ref >60.00)
Glucose, Bld: 77 mg/dL (ref 70–99)
Potassium: 4.3 mEq/L (ref 3.5–5.1)
Sodium: 141 mEq/L (ref 135–145)
Total Bilirubin: 0.6 mg/dL (ref 0.2–1.2)
Total Protein: 7.1 g/dL (ref 6.0–8.3)

## 2022-01-20 LAB — LDL CHOLESTEROL, DIRECT: Direct LDL: 74 mg/dL

## 2022-01-20 LAB — LIPID PANEL
Cholesterol: 133 mg/dL (ref 0–200)
HDL: 38.4 mg/dL — ABNORMAL LOW (ref >39.00)
NonHDL: 94.98
Total CHOL/HDL Ratio: 3
Triglycerides: 245 mg/dL — ABNORMAL HIGH (ref 0.0–149.0)
VLDL: 49 mg/dL — ABNORMAL HIGH (ref 0.0–40.0)

## 2022-01-20 MED ORDER — WEGOVY 0.5 MG/0.5ML ~~LOC~~ SOAJ: SUBCUTANEOUS | 0 refills | Status: DC

## 2022-01-20 NOTE — Assessment & Plan Note (Signed)
ls spine xray ?Check  ua ?D/w diet and exercise  ?

## 2022-01-20 NOTE — Assessment & Plan Note (Signed)
Check labs 

## 2022-01-20 NOTE — Telephone Encounter (Signed)
PA initiated via Covermymeds; KEY: [id number]. Awaiting determination.  ?

## 2022-01-20 NOTE — Assessment & Plan Note (Signed)
Encourage heart healthy diet such as MIND or DASH diet, increase exercise, avoid trans fats, simple carbohydrates and processed foods, consider a krill or fish or flaxseed oil cap daily.  °

## 2022-01-20 NOTE — Patient Instructions (Signed)
Low Back Sprain or Strain Rehab ?Ask your health care provider which exercises are safe for you. Do exercises exactly as told by your health care provider and adjust them as directed. It is normal to feel mild stretching, pulling, tightness, or discomfort as you do these exercises. Stop right away if you feel sudden pain or your pain gets worse. Do not begin these exercises until told by your health care provider. ?Stretching and range-of-motion exercises ?These exercises warm up your muscles and joints and improve the movement and flexibility of your back. These exercises also help to relieve pain, numbness, and tingling. ?Lumbar rotation ? ?Lie on your back on a firm bed or the floor with your knees bent. ?Straighten your arms out to your sides so each arm forms a 90-degree angle (right angle) with a side of your body. ?Slowly move (rotate) both of your knees to one side of your body until you feel a stretch in your lower back (lumbar). Try not to let your shoulders lift off the floor. ?Hold this position for __________ seconds. ?Tense your abdominal muscles and slowly move your knees back to the starting position. ?Repeat this exercise on the other side of your body. ?Repeat __________ times. Complete this exercise __________ times a day. ?Single knee to chest ? ?Lie on your back on a firm bed or the floor with both legs straight. ?Bend one of your knees. Use your hands to move your knee up toward your chest until you feel a gentle stretch in your lower back and buttock. ?Hold your leg in this position by holding on to the front of your knee. ?Keep your other leg as straight as possible. ?Hold this position for __________ seconds. ?Slowly return to the starting position. ?Repeat with your other leg. ?Repeat __________ times. Complete this exercise __________ times a day. ?Prone extension on elbows ? ?Lie on your abdomen on a firm bed or the floor (prone position). ?Prop yourself up on your elbows. ?Use your arms  to help lift your chest up until you feel a gentle stretch in your abdomen and your lower back. ?This will place some of your body weight on your elbows. If this is uncomfortable, try stacking pillows under your chest. ?Your hips should stay down, against the surface that you are lying on. Keep your hip and back muscles relaxed. ?Hold this position for __________ seconds. ?Slowly relax your upper body and return to the starting position. ?Repeat __________ times. Complete this exercise __________ times a day. ?Strengthening exercises ?These exercises build strength and endurance in your back. Endurance is the ability to use your muscles for a long time, even after they get tired. ?Pelvic tilt ?This exercise strengthens the muscles that lie deep in the abdomen. ?Lie on your back on a firm bed or the floor with your legs extended. ?Bend your knees so they are pointing toward the ceiling and your feet are flat on the floor. ?Tighten your lower abdominal muscles to press your lower back against the floor. This motion will tilt your pelvis so your tailbone points up toward the ceiling instead of pointing to your feet or the floor. ?To help with this exercise, you may place a small towel under your lower back and try to push your back into the towel. ?Hold this position for __________ seconds. ?Let your muscles relax completely before you repeat this exercise. ?Repeat __________ times. Complete this exercise __________ times a day. ?Alternating arm and leg raises ? ?Get on your hands   and knees on a firm surface. If you are on a hard floor, you may want to use padding, such as an exercise mat, to cushion your knees. ?Line up your arms and legs. Your hands should be directly below your shoulders, and your knees should be directly below your hips. ?Lift your left leg behind you. At the same time, raise your right arm and straighten it in front of you. ?Do not lift your leg higher than your hip. ?Do not lift your arm higher  than your shoulder. ?Keep your abdominal and back muscles tight. ?Keep your hips facing the ground. ?Do not arch your back. ?Keep your balance carefully, and do not hold your breath. ?Hold this position for __________ seconds. ?Slowly return to the starting position. ?Repeat with your right leg and your left arm. ?Repeat __________ times. Complete this exercise __________ times a day. ?Abdominal set with straight leg raise ? ?Lie on your back on a firm bed or the floor. ?Bend one of your knees and keep your other leg straight. ?Tense your abdominal muscles and lift your straight leg up, 4-6 inches (10-15 cm) off the ground. ?Keep your abdominal muscles tight and hold this position for __________ seconds. ?Do not hold your breath. ?Do not arch your back. Keep it flat against the ground. ?Keep your abdominal muscles tense as you slowly lower your leg back to the starting position. ?Repeat with your other leg. ?Repeat __________ times. Complete this exercise __________ times a day. ?Single leg lower with bent knees ?Lie on your back on a firm bed or the floor. ?Tense your abdominal muscles and lift your feet off the floor, one foot at a time, so your knees and hips are bent in 90-degree angles (right angles). ?Your knees should be over your hips and your lower legs should be parallel to the floor. ?Keeping your abdominal muscles tense and your knee bent, slowly lower one of your legs so your toe touches the ground. ?Lift your leg back up to return to the starting position. ?Do not hold your breath. ?Do not let your back arch. Keep your back flat against the ground. ?Repeat with your other leg. ?Repeat __________ times. Complete this exercise __________ times a day. ?Posture and body mechanics ?Good posture and healthy body mechanics can help to relieve stress in your body's tissues and joints. Body mechanics refers to the movements and positions of your body while you do your daily activities. Posture is part of body  mechanics. Good posture means: ?Your spine is in its natural S-curve position (neutral). ?Your shoulders are pulled back slightly. ?Your head is not tipped forward (neutral). ?Follow these guidelines to improve your posture and body mechanics in your everyday activities. ?Standing ? ?When standing, keep your spine neutral and your feet about hip-width apart. Keep a slight bend in your knees. Your ears, shoulders, and hips should line up. ?When you do a task in which you stand in one place for a long time, place one foot up on a stable object that is 2-4 inches (5-10 cm) high, such as a footstool. This helps keep your spine neutral. ?Sitting ? ?When sitting, keep your spine neutral and keep your feet flat on the floor. Use a footrest, if necessary, and keep your thighs parallel to the floor. Avoid rounding your shoulders, and avoid tilting your head forward. ?When working at a desk or a computer, keep your desk at a height where your hands are slightly lower than your elbows. Slide your   chair under your desk so you are close enough to maintain good posture. ?When working at a computer, place your monitor at a height where you are looking straight ahead and you do not have to tilt your head forward or downward to look at the screen. ?Resting ?When lying down and resting, avoid positions that are most painful for you. ?If you have pain with activities such as sitting, bending, stooping, or squatting, lie in a position in which your body does not bend very much. For example, avoid curling up on your side with your arms and knees near your chest (fetal position). ?If you have pain with activities such as standing for a long time or reaching with your arms, lie with your spine in a neutral position and bend your knees slightly. Try the following positions: ?Lying on your side with a pillow between your knees. ?Lying on your back with a pillow under your knees. ?Lifting ? ?When lifting objects, keep your feet at least  shoulder-width apart and tighten your abdominal muscles. ?Bend your knees and hips and keep your spine neutral. It is important to lift using the strength of your legs, not your back. Do not lock your knees

## 2022-01-20 NOTE — Progress Notes (Signed)
? ?Established Patient Office Visit ? ?Subjective   ?Patient ID: Daniel Acevedo, male    DOB: August 15, 1976  Age: 46 y.o. MRN: [id number] ? ?Chief Complaint  ?Patient presents with  ? Back Pain  ?  3-4 weeks, low back pain, Pt seen in Feb for concern by a different office. No falls or injuries.   ? ? ?HPI ?Pt is here c/o low back pain worsening over the last 3-4 weeks    the pain is across low back and does not radiate down his legs ?He is on his feet 10-12 hours a day --- wearing good shoes helps.   He has been taking IB with no relief     ? ? ?Patient Active Problem List  ? Diagnosis Date Noted  ? Chronic bilateral low back pain without sciatica 01/20/2022  ? Morbid obesity ([place]) 01/20/2022  ? Thoracic aortic aneurysm without rupture ([place]) 06/11/2021  ? Sleep deprivation 12/01/2019  ? Hypersomnia with sleep apnea 12/01/2019  ? Preventative health care 07/02/2017  ? HTN (hypertension) 10/09/2015  ? Bronchitis 12/04/2013  ? Otitis media 12/04/2013  ? Severe obesity (BMI >= 40) ([place]) 08/01/2013  ? Anxiety 11/01/2012  ? DEPRESSIVE DISORDER 04/27/2010  ? Hypothyroidism (acquired) 03/15/2010  ? Hyperlipidemia 01/26/2010  ? THROMBOCYTOPENIA 01/12/2010  ? TOBACCO USE 01/04/2010  ? CARPAL TUNNEL SYNDROME, BILATERAL 01/04/2010  ? SNORING 01/04/2010  ? COUGH 01/04/2010  ? ?Past Medical History:  ?Diagnosis Date  ? CARPAL TUNNEL SYNDROME, BILATERAL 01/04/2010  ? DEPRESSIVE DISORDER 04/27/2010  ? HYPERLIPIDEMIA 01/26/2010  ? Hypertension   ? HYPOTHYROIDISM 03/15/2010  ? THROMBOCYTOPENIA 01/12/2010  ? ?Past Surgical History:  ?Procedure Laterality Date  ? BONE MARROW BIOPSY    ? ?Social History  ? ?Tobacco Use  ? Smoking status: Smoker, Current Status Unknown  ?  Packs/day: 2.00  ?  Years: 23.00  ?  Pack years: 46.00  ?  Types: Cigarettes  ? Smokeless tobacco: Never  ? Tobacco comments:  ?  pt has tried many otc--- and wellbutrin and chantix with no success  ?Vaping Use  ? Vaping Use: Never used  ?Substance Use Topics  ? Alcohol use: Yes   ?  Alcohol/week: 0.0 standard drinks  ?  Comment: occasional  ? Drug use: No  ? ?Social History  ? ?Socioeconomic History  ? Marital status: Married  ?  Spouse name: Not on file  ? Number of children: Not on file  ? Years of education: Not on file  ? Highest education level: Not on file  ?Occupational History  ? Occupation:  Freight forwarder  ?  Employer: [employer]  ?  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  ?Tobacco Use  ? Smoking status: Smoker, Current Status Unknown  ?  Packs/day: 2.00  ?  Years: 23.00  ?  Pack years: 46.00  ?  Types: Cigarettes  ? Smokeless tobacco: Never  ? Tobacco comments:  ?  pt has tried many otc--- and wellbutrin and chantix with no success  ?Vaping Use  ? Vaping Use: Never used  ?Substance and Sexual Activity  ? Alcohol use: Yes  ?  Alcohol/week: 0.0 standard drinks  ?  Comment: occasional  ? Drug use: No  ? Sexual activity: Yes  ?  Partners: Female  ?Other Topics Concern  ? Not on file  ?Social History Narrative  ? Regular exercise-no  ?   ? ?Social Determinants of Health  ? ?Financial Resource Strain: Not on file  ?Food Insecurity: Not on file  ?Transportation Needs: Not on file  ?  Physical Activity: Not on file  ?Stress: Not on file  ?Social Connections: Not on file  ?Intimate Partner Violence: Not on file  ? ?Family Status  ?Relation Name Status  ? Mother  Alive  ? Father  Deceased at age 26  ? Brother  Alive  ? Brother  Deceased at age 57  ? ?Family History  ?Problem Relation Age of Onset  ? Thyroid disease Mother   ?     hypothyroidism  ? Hypertension Mother   ? Heart disease Father   ?     cabg  ? Diabetes Father   ? Kidney disease Father   ? Hypertension Father   ? Hypertension Brother   ? Heart disease Brother 3  ?     MI  ? Diabetes Brother   ? Hypertension Brother   ? Hyperlipidemia Brother   ? ?No Known Allergies ?  ? ?Review of Systems  ?Constitutional:  Negative for fever and malaise/fatigue.  ?HENT:  Negative for congestion.   ?Eyes:  Negative for blurred vision.  ?Respiratory:  Negative for  cough and shortness of breath.   ?Cardiovascular:  Negative for chest pain, palpitations and leg swelling.  ?Gastrointestinal:  Negative for vomiting.  ?Musculoskeletal:  Negative for back pain.  ?Skin:  Negative for rash.  ?Neurological:  Negative for loss of consciousness and headaches.  ? ?  ?Objective:  ?  ? ?BP 110/90 (BP Location: Right Arm, Patient Position: Sitting, Cuff Size: Large)   Pulse 72   Temp 98.1 ?F (36.7 ?C) (Oral)   Resp 18   Ht '5\' 10"'  (1.778 m)   Wt (!) 318 lb 3.2 oz (144.3 kg)   SpO2 98%   BMI 45.66 kg/m?  ?BP Readings from Last 3 Encounters:  ?01/20/22 110/90  ?11/16/21 118/76  ?09/21/21 110/72  ? ?Wt Readings from Last 3 Encounters:  ?01/20/22 (!) 318 lb 3.2 oz (144.3 kg)  ?11/16/21 (!) 318 lb 3.2 oz (144.3 kg)  ?09/21/21 (!) 321 lb (145.6 kg)  ? ?SpO2 Readings from Last 3 Encounters:  ?01/20/22 98%  ?11/16/21 98%  ?09/21/21 95%  ? ?  ? ?Physical Exam ?Vitals and nursing note reviewed.  ?Constitutional:   ?   Appearance: He is well-developed.  ?HENT:  ?   Head: Normocephalic and atraumatic.  ?Eyes:  ?   Pupils: Pupils are equal, round, and reactive to light.  ?Neck:  ?   Thyroid: No thyromegaly.  ?Cardiovascular:  ?   Rate and Rhythm: Normal rate and regular rhythm.  ?   Heart sounds: No murmur heard. ?Pulmonary:  ?   Effort: Pulmonary effort is normal. No respiratory distress.  ?   Breath sounds: Normal breath sounds. No wheezing or rales.  ?Chest:  ?   Chest wall: No tenderness.  ?Musculoskeletal:     ?   General: No swelling, tenderness, deformity or signs of injury.  ?   Cervical back: Normal range of motion and neck supple.  ?   Right hip: Normal range of motion. Normal strength.  ?   Left hip: Normal range of motion. Normal strength.  ?   Right foot: Bony tenderness present. No swelling.  ?   Left foot: Bony tenderness present. No swelling.  ?Skin: ?   General: Skin is warm and dry.  ?Neurological:  ?   General: No focal deficit present.  ?   Mental Status: He is alert and  oriented to person, place, and time.  ?   Motor: No weakness.  ?  Gait: Gait normal.  ?   Deep Tendon Reflexes: Reflexes normal.  ?Psychiatric:     ?   Behavior: Behavior normal.     ?   Thought Content: Thought content normal.     ?   Judgment: Judgment normal.  ? ? ?No results found for any visits on 01/20/22. ? ?Last CBC ?Lab Results  ?Component Value Date  ? WBC 8.8 04/06/2018  ? HGB 15.5 04/06/2018  ? HCT 44.4 04/06/2018  ? MCV 82.4 04/06/2018  ? MCH 28.8 04/06/2018  ? RDW 13.4 04/06/2018  ? PLT 69 (L) 04/06/2018  ? ?Last metabolic panel ?Lab Results  ?Component Value Date  ? GLUCOSE 94 06/11/2021  ? NA 140 06/11/2021  ? K 4.4 06/11/2021  ? CL 105 06/11/2021  ? CO2 26 06/11/2021  ? BUN 19 06/11/2021  ? CREATININE 1.30 06/11/2021  ? CALCIUM 10.0 06/11/2021  ? PROT 7.0 06/11/2021  ? ALBUMIN 4.6 06/11/2021  ? BILITOT 0.6 06/11/2021  ? ALKPHOS 83 06/11/2021  ? AST 22 06/11/2021  ? ALT 23 06/11/2021  ? ?Last lipids ?Lab Results  ?Component Value Date  ? CHOL 184 06/11/2021  ? HDL 46.00 06/11/2021  ? LDLCALC 110 (H) 06/11/2021  ? LDLDIRECT 123.0 05/05/2020  ? TRIG 139.0 06/11/2021  ? CHOLHDL 4 06/11/2021  ? ?Last hemoglobin A1c ?Lab Results  ?Component Value Date  ? HGBA1C 5.6 06/11/2021  ? ?Last thyroid functions ?Lab Results  ?Component Value Date  ? TSH 35.84 (H) 06/11/2021  ? ?Last vitamin D ?No results found for: 25OHVITD2, [place], VD25OH ?Last vitamin B12 and Folate ?Lab Results  ?Component Value Date  ? [id number] 908 08/01/2013  ? ?  ? ?The 10-year ASCVD risk score (Arnett DK, et al., 2019) is: 4% ? ?  ?Assessment & Plan:  ? ?Problem List Items Addressed This Visit   ? ?  ? Unprioritized  ? HTN (hypertension)  ? Relevant Medications  ? Semaglutide-Weight Management (WEGOVY) 0.5 MG/0.5ML SOAJ  ? Other Relevant Orders  ? Lipid panel  ? Comprehensive metabolic panel  ? POCT Urinalysis Dipstick (Automated)  ? Severe obesity (BMI >= 40) ([facility])  ?  wegovy 0.25 weekly for 1 month ?0.5 mg weekly next month ?D/w pt  diet and execise----  He is unable to exercise due to pain ? ?  ?  ? Relevant Medications  ? Semaglutide-Weight Management (WEGOVY) 0.5 MG/0.5ML SOAJ  ? Morbid obesity ([place])  ? Relevant Medications  ? Semaglutide-Weig

## 2022-01-20 NOTE — Assessment & Plan Note (Signed)
wegovy 0.25 weekly for 1 month ?0.5 mg weekly next month ?D/w pt diet and execise----  He is unable to exercise due to pain ?

## 2022-01-21 ENCOUNTER — Other Ambulatory Visit: Payer: Self-pay | Admitting: Family Medicine

## 2022-01-21 DIAGNOSIS — M549 Dorsalgia, unspecified: Secondary | ICD-10-CM

## 2022-01-24 NOTE — Telephone Encounter (Signed)
PA approved.

## 2022-01-26 ENCOUNTER — Other Ambulatory Visit: Payer: Self-pay | Admitting: Family Medicine

## 2022-01-26 DIAGNOSIS — E785 Hyperlipidemia, unspecified: Secondary | ICD-10-CM

## 2022-01-27 ENCOUNTER — Other Ambulatory Visit: Payer: Self-pay | Admitting: Family Medicine

## 2022-01-27 DIAGNOSIS — E785 Hyperlipidemia, unspecified: Secondary | ICD-10-CM

## 2022-02-05 ENCOUNTER — Ambulatory Visit
Admission: RE | Admit: 2022-02-05 | Discharge: 2022-02-05 | Disposition: A | Discharge status: Home / Self Care | Payer: BC Managed Care – PPO | Source: Ambulatory Visit | Attending: Family Medicine | Admitting: Family Medicine

## 2022-02-05 DIAGNOSIS — M549 Dorsalgia, unspecified: Secondary | ICD-10-CM

## 2022-02-05 DIAGNOSIS — M40204 Unspecified kyphosis, thoracic region: Secondary | ICD-10-CM | POA: Diagnosis not present

## 2022-02-09 ENCOUNTER — Other Ambulatory Visit: Payer: Self-pay

## 2022-02-09 DIAGNOSIS — M549 Dorsalgia, unspecified: Secondary | ICD-10-CM

## 2022-02-16 ENCOUNTER — Other Ambulatory Visit: Payer: Self-pay | Admitting: Family Medicine

## 2022-02-16 DIAGNOSIS — I10 Essential (primary) hypertension: Secondary | ICD-10-CM

## 2022-03-05 ENCOUNTER — Other Ambulatory Visit: Payer: Self-pay | Admitting: Family Medicine

## 2022-03-05 DIAGNOSIS — I10 Essential (primary) hypertension: Secondary | ICD-10-CM

## 2022-03-05 DIAGNOSIS — E785 Hyperlipidemia, unspecified: Secondary | ICD-10-CM

## 2022-03-07 MED ORDER — WEGOVY 1 MG/0.5ML ~~LOC~~ SOAJ: 1.0000 mg | SUBCUTANEOUS | 0 refills | Status: DC

## 2022-03-09 ENCOUNTER — Ambulatory Visit: Payer: BC Managed Care – PPO | Admitting: Orthopaedic Surgery

## 2022-03-09 VITALS — BP 115/78 | HR 78

## 2022-03-09 DIAGNOSIS — G8929 Other chronic pain: Secondary | ICD-10-CM

## 2022-03-09 DIAGNOSIS — M5459 Other low back pain: Secondary | ICD-10-CM

## 2022-03-09 DIAGNOSIS — M545 Low back pain, unspecified: Secondary | ICD-10-CM

## 2022-03-09 NOTE — Progress Notes (Signed)
Office Visit Note   Patient: Daniel Acevedo           Date of Birth: [birth year]           MRN: [id number] Visit Date: 03/09/2022              Requested by: [address] [place], [place] [address] [place],  [place] [address] PCP: Carollee Herter, Alferd Apa, DO   Assessment & Plan: Visit Diagnoses:  1. Chronic bilateral low back pain without sciatica     Plan: We will set patient up for some physical therapy recheck 4 to 6 weeks if he is having persistent problems we can consider lumbar imaging.  We discussed with the thoracic disc degenerative changes is likely that he has some changes in his lumbar spine as well but currently does not have any radiculopathy and no claudication symptoms.  We discussed improvement in his back symptoms with weight loss and unloading his back as well as core strengthening.  Follow-Up Instructions: Return in about 7 weeks (around 04/27/2022).   Orders:  Orders Placed This Encounter  Procedures   Ambulatory referral to Physical Therapy   No orders of the defined types were placed in this encounter.     Procedures: No procedures performed   Clinical Data: No additional findings.   Subjective: Chief Complaint  Patient presents with   Middle Back - Pain    HPI 46 year old male here with his wife with problems with the low back pain.  He has had discomfort which has may be gradually increased some since February 2023.  No significant lower extremity weakness pain is in his back radiates into his buttocks.  Worse with twisting activities.  He has continued to work on a daily basis.  Patient does have hypertension hyperlipidemia he is a smoker.  Morbidly obese recently started on Wegovy.  T11 showed some mild loss chronic changes not really suggestive of compression fracture.  Patient had an MRI scan thoracic spine which showed some levels with tiny more moderate size disc extrusions without mass effect on the cord.  Patient states his pain is  really been lower back.  Review of Systems all systems noncontributory to HPI.   Objective: Vital Signs: BP 115/78   Pulse 78   Physical Exam Constitutional:      Appearance: He is well-developed.  HENT:     Head: Normocephalic and atraumatic.     Right Ear: External ear normal.     Left Ear: External ear normal.  Eyes:     Pupils: Pupils are equal, round, and reactive to light.  Neck:     Thyroid: No thyromegaly.     Trachea: No tracheal deviation.  Cardiovascular:     Rate and Rhythm: Normal rate.  Pulmonary:     Effort: Pulmonary effort is normal.     Breath sounds: No wheezing.  Abdominal:     General: Bowel sounds are normal.     Palpations: Abdomen is soft.  Musculoskeletal:     Cervical back: Neck supple.  Skin:    General: Skin is warm and dry.     Capillary Refill: Capillary refill takes less than 2 seconds.  Neurological:     Mental Status: He is alert and oriented to person, place, and time.  Psychiatric:        Behavior: Behavior normal.        Thought Content: Thought content normal.        Judgment:  Judgment normal.     Ortho Exam reflexes are intact.  Anterior tib gastrocsoleus heel and toe walking is normal.  Specialty Comments:  No specialty comments available.  Imaging: No results found.   PMFS History: Patient Active Problem List   Diagnosis Date Noted   Low back pain 01/20/2022   Morbid obesity ([place]) 01/20/2022   Thoracic aortic aneurysm without rupture ([place]) 06/11/2021   Sleep deprivation 12/01/2019   Hypersomnia with sleep apnea 12/01/2019   Preventative health care 07/02/2017   HTN (hypertension) 10/09/2015   Bronchitis 12/04/2013   Otitis media 12/04/2013   Severe obesity (BMI >= 40) ([place]) 08/01/2013   Anxiety 11/01/2012   DEPRESSIVE DISORDER 04/27/2010   Hypothyroidism (acquired) 03/15/2010   Hyperlipidemia 01/26/2010   THROMBOCYTOPENIA 01/12/2010   TOBACCO USE 01/04/2010   CARPAL TUNNEL SYNDROME, BILATERAL 01/04/2010    SNORING 01/04/2010   COUGH 01/04/2010   Past Medical History:  Diagnosis Date   CARPAL TUNNEL SYNDROME, BILATERAL 01/04/2010   DEPRESSIVE DISORDER 04/27/2010   HYPERLIPIDEMIA 01/26/2010   Hypertension    HYPOTHYROIDISM 03/15/2010   THROMBOCYTOPENIA 01/12/2010    Family History  Problem Relation Age of Onset   Thyroid disease Mother        hypothyroidism   Hypertension Mother    Heart disease Father        cabg   Diabetes Father    Kidney disease Father    Hypertension Father    Hypertension Brother    Heart disease Brother 70       MI   Diabetes Brother    Hypertension Brother    Hyperlipidemia Brother     Past Surgical History:  Procedure Laterality Date   BONE MARROW BIOPSY     Social History   Occupational History   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

## 2022-03-18 ENCOUNTER — Other Ambulatory Visit: Payer: Self-pay | Admitting: Family Medicine

## 2022-03-18 DIAGNOSIS — E039 Hypothyroidism, unspecified: Secondary | ICD-10-CM

## 2022-04-03 ENCOUNTER — Other Ambulatory Visit: Payer: Self-pay | Admitting: Family Medicine

## 2022-04-06 ENCOUNTER — Other Ambulatory Visit: Payer: Self-pay | Admitting: Family Medicine

## 2022-04-15 ENCOUNTER — Other Ambulatory Visit: Payer: Self-pay | Admitting: Family Medicine

## 2022-04-15 DIAGNOSIS — E785 Hyperlipidemia, unspecified: Secondary | ICD-10-CM

## 2022-04-15 DIAGNOSIS — I10 Essential (primary) hypertension: Secondary | ICD-10-CM

## 2022-04-26 ENCOUNTER — Other Ambulatory Visit: Payer: Self-pay | Admitting: Family Medicine

## 2022-04-26 DIAGNOSIS — I10 Essential (primary) hypertension: Secondary | ICD-10-CM

## 2022-04-26 DIAGNOSIS — E785 Hyperlipidemia, unspecified: Secondary | ICD-10-CM

## 2022-04-27 ENCOUNTER — Other Ambulatory Visit: Payer: Self-pay | Admitting: Family Medicine

## 2022-04-27 MED ORDER — WEGOVY 1.7 MG/0.75ML ~~LOC~~ SOAJ: 1.7000 mg | SUBCUTANEOUS | 0 refills | Status: DC

## 2022-05-21 ENCOUNTER — Other Ambulatory Visit: Payer: Self-pay | Admitting: Family Medicine

## 2022-05-23 MED ORDER — WEGOVY 2.4 MG/0.75ML ~~LOC~~ SOAJ: 2.4000 mg | SUBCUTANEOUS | 1 refills | Status: DC

## 2022-06-21 ENCOUNTER — Other Ambulatory Visit: Payer: Self-pay | Admitting: Family Medicine

## 2022-06-21 DIAGNOSIS — R1013 Epigastric pain: Secondary | ICD-10-CM

## 2022-07-03 IMAGING — MR MR THORACIC SPINE W/O CM
4 of 6 series · 20 of 48 positions shown · non-contrast
Comparison: Lumbar radiographs 01/20/2022. Chest CT 09/15/2021.

CLINICAL DATA: 45-year-old male with back pain since [REDACTED].
Possible T11 thoracic compression fracture on radiographs last
month.

EXAM:
MRI THORACIC SPINE WITHOUT CONTRAST
TECHNIQUE: Multiplanar, multisequence MR imaging of the thoracic spine was
performed. No intravenous contrast was administered.

[Series 17: T1 · sagittal · 3.0mm · 0.89mm/px · 4 of 15 slices shown]
[im 1/15]
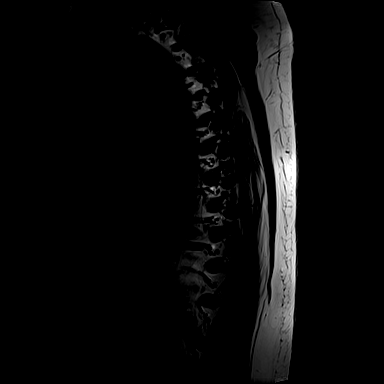
[im 4/15]
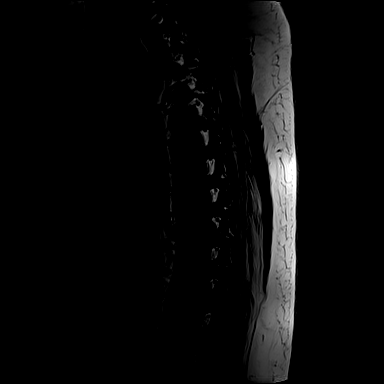
[im 8/15]
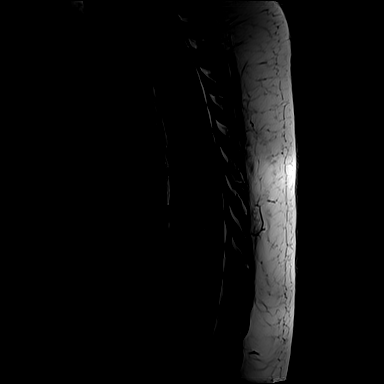
[im 15/15]
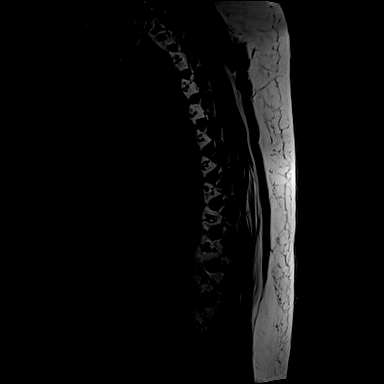

[Series 18: STIR · sagittal · 3.0mm · 1.06mm/px · 3 of 15 slices shown]
[im 1/15]
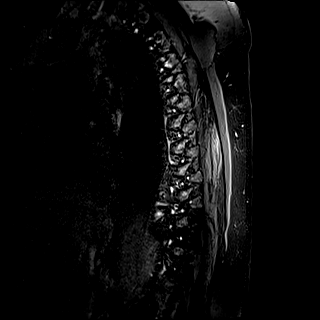
[im 8/15]
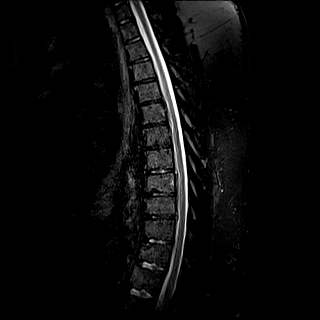
[im 15/15]
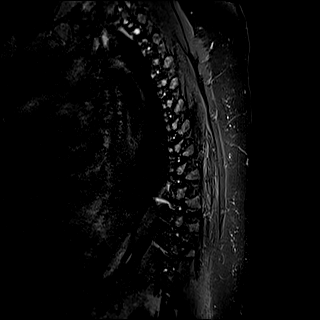

[Series 19: T2 · sagittal · 3.0mm · 0.89mm/px · 5 of 15 slices shown (1 of 2)]
[im 1/15]
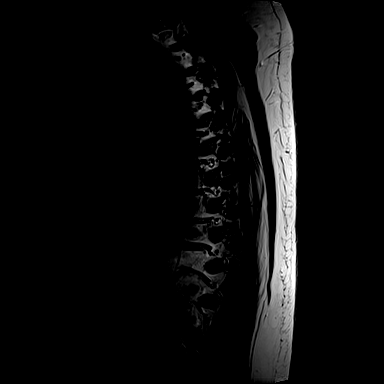
[im 4/15]
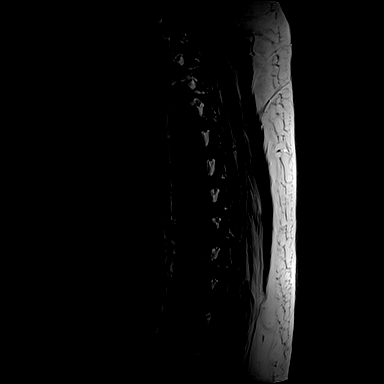
[im 8/15]
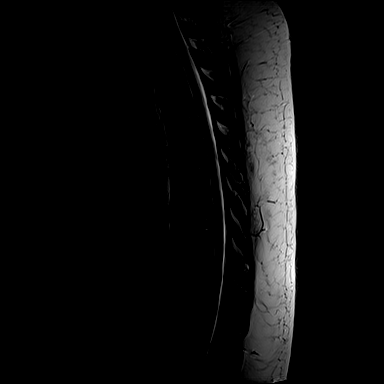
[im 11/15]
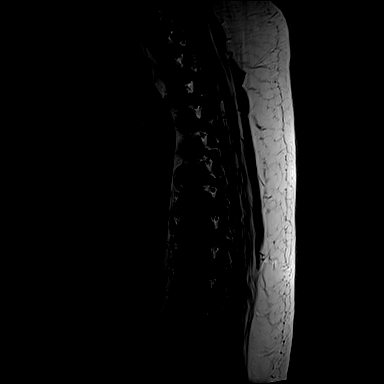
[im 15/15]
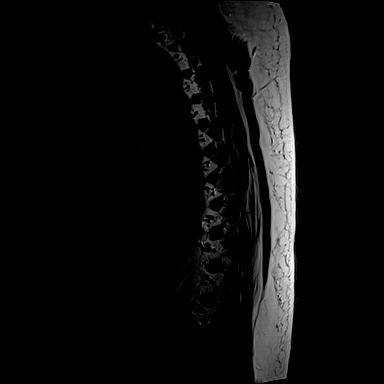

[Series 20: T2 · axial · 4.0mm · 0.28mm/px · z∈[-343,-85]mm · 8 of 40 slices shown (2 of 2)]
[im 1/40]
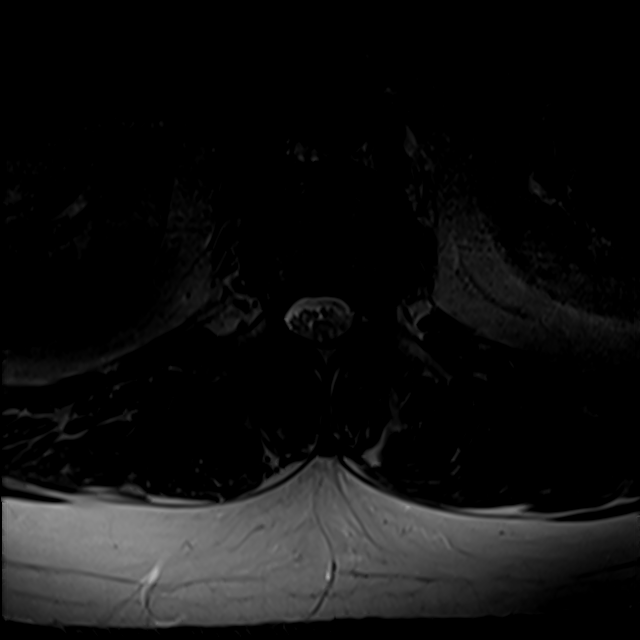
[im 7/40]
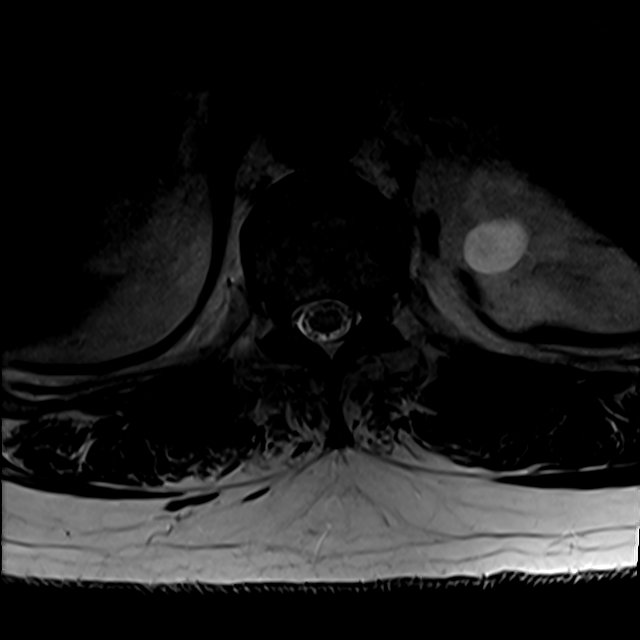
[im 13/40]
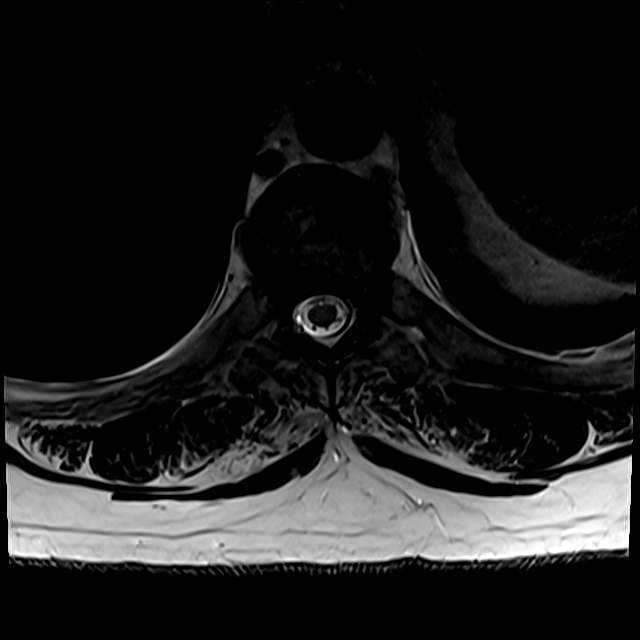
[im 19/40]
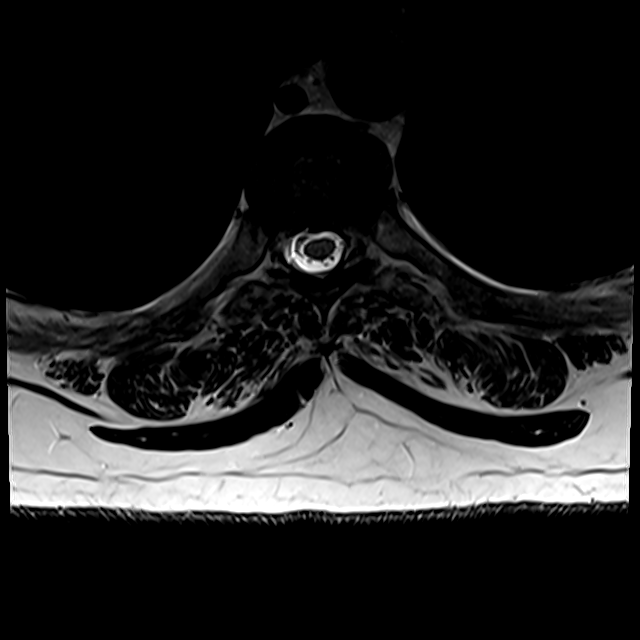
[im 22/40]
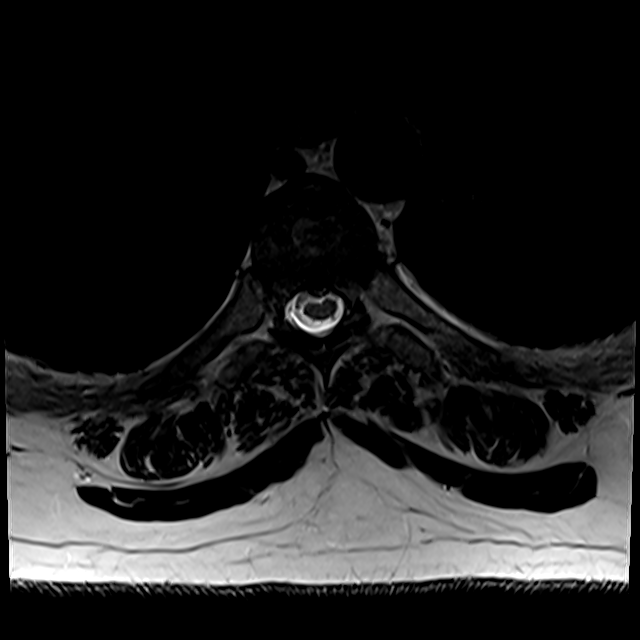
[im 28/40]
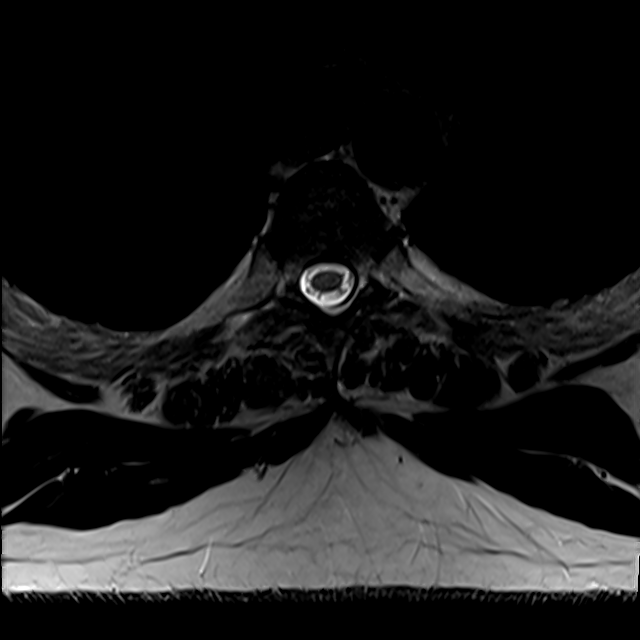
[im 34/40]
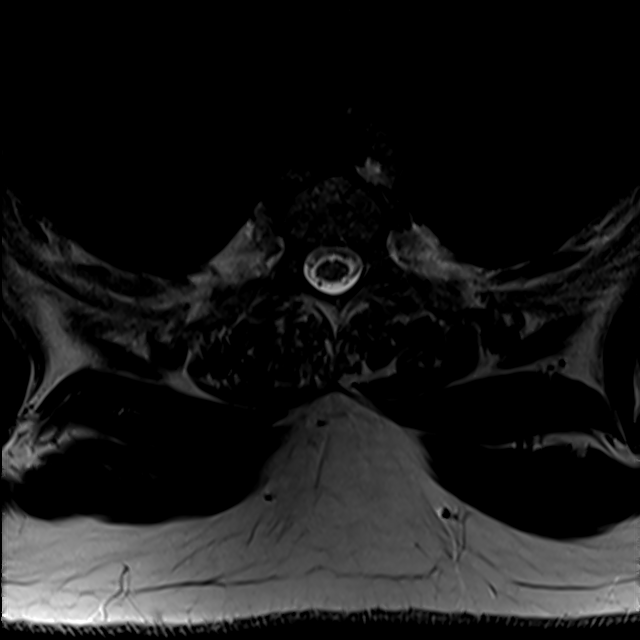
[im 40/40]
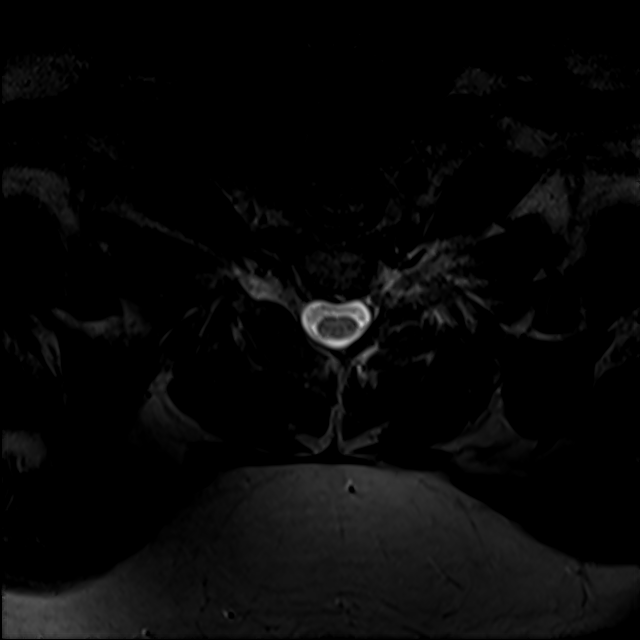

[20 of 48 positions shown; findings below may reference images not displayed]

FINDINGS: Limited cervical spine imaging:  Unremarkable.

Thoracic spine segmentation:  Appears normal.

Alignment: Mildly exaggerated lower thoracic kyphosis appears stable
from the [facility] chest CT, and is in association with wedging of
several lower thoracic vertebrae, most notably T11. No
spondylolisthesis.

Vertebrae: No marrow edema or evidence of acute osseous abnormality.
Chronic anterior wedging of T11, and to a lesser extent T10 and T12.
Those levels appear stable compared to chest CT last year.
Background bone marrow signal is within normal limits.

Cord: Some degenerative mass effect on the ventral spinal cord
related to multilevel thoracic disc herniations (see below). But no
thoracic spinal cord signal abnormality. Conus medullaris is
partially visible at T12-L1 and appears normal.

Paraspinal and other soft tissues: Negative; no change compared to
[facility] chest CT.

Disc levels:

T1-T2: Negative.

T2-T3: Small central disc protrusion or extrusion (series 20, image
9). No stenosis.

T3-T4: Negative.

T4-T5: Small to moderate central and slightly caudal disc extrusion
(series 20, image 16). Borderline to mild mass effect on the ventral
spinal cord but dorsal CSF space is patent. No stenosis.

T5-T6: Moderate sized central and slightly caudal disc extrusion
(series 20, image 18). Mass effect on the ventral spinal cord and up
to mild spinal stenosis. No foraminal involvement or stenosis.

T6-T7: Smaller central to right paracentral disc protrusion or
extrusion (series 20, image 21). Mild mass effect on the ventral
spinal cord but no stenosis.

T7-T8: Moderate left paracentral and slightly caudal disc extrusion
(series 20, image 25). Mild ventral cord mass effect and up to mild
spinal stenosis. No foraminal involvement or stenosis.

T8-T9: Negative disc. Mild facet hypertrophy. No stenosis.

T9-T10: Small central disc extrusion (series 20, image 30 and series
19, image 8). Borderline to mild ventral cord mass effect but no
significant stenosis.

T10-T11: Mild facet hypertrophy. No stenosis.

T11-T12: Negative.

T12-L1: Negative.
IMPRESSION: 1. No acute osseous abnormality in the thoracic spine. Anterior
wedging of T11 >T10 and T12 is chronic and might be related to
Beletew disease.

2. Fairly widespread although generally small central and
paracentral thoracic disc herniations. The largest (moderate size)
are at T5-T6 and T7-T8 with up to mild spinal stenosis. Associated
mass effect on the ventral spinal cord but no cord signal
abnormality. No associated foraminal stenosis.

## 2022-07-18 ENCOUNTER — Other Ambulatory Visit: Payer: Self-pay | Admitting: Family Medicine

## 2022-07-18 NOTE — Telephone Encounter (Signed)
Okay to keep rx or should it be changed

## 2022-07-23 ENCOUNTER — Other Ambulatory Visit: Payer: Self-pay | Admitting: Family Medicine

## 2022-07-23 DIAGNOSIS — E785 Hyperlipidemia, unspecified: Secondary | ICD-10-CM

## 2022-07-23 DIAGNOSIS — R1013 Epigastric pain: Secondary | ICD-10-CM

## 2022-07-23 DIAGNOSIS — I10 Essential (primary) hypertension: Secondary | ICD-10-CM

## 2022-08-13 ENCOUNTER — Other Ambulatory Visit: Payer: Self-pay | Admitting: Family Medicine

## 2022-08-13 DIAGNOSIS — E785 Hyperlipidemia, unspecified: Secondary | ICD-10-CM

## 2022-08-17 ENCOUNTER — Other Ambulatory Visit: Payer: Self-pay | Admitting: Thoracic Surgery (Cardiothoracic Vascular Surgery)

## 2022-08-17 DIAGNOSIS — I7121 Aneurysm of the ascending aorta, without rupture: Secondary | ICD-10-CM

## 2022-08-21 ENCOUNTER — Other Ambulatory Visit: Payer: Self-pay | Admitting: Family Medicine

## 2022-08-21 DIAGNOSIS — I10 Essential (primary) hypertension: Secondary | ICD-10-CM

## 2022-09-28 ENCOUNTER — Other Ambulatory Visit: Payer: Self-pay | Admitting: Family Medicine

## 2022-09-28 DIAGNOSIS — E039 Hypothyroidism, unspecified: Secondary | ICD-10-CM

## 2022-09-29 ENCOUNTER — Ambulatory Visit
Admission: RE | Admit: 2022-09-29 | Discharge: 2022-09-29 | Disposition: A | Discharge status: Home / Self Care | Payer: BC Managed Care – PPO | Source: Ambulatory Visit | Attending: Thoracic Surgery (Cardiothoracic Vascular Surgery) | Admitting: Thoracic Surgery (Cardiothoracic Vascular Surgery)

## 2022-09-29 DIAGNOSIS — I7121 Aneurysm of the ascending aorta, without rupture: Secondary | ICD-10-CM

## 2022-09-29 DIAGNOSIS — K449 Diaphragmatic hernia without obstruction or gangrene: Secondary | ICD-10-CM | POA: Diagnosis not present

## 2022-09-29 DIAGNOSIS — I712 Thoracic aortic aneurysm, without rupture, unspecified: Secondary | ICD-10-CM | POA: Diagnosis not present

## 2022-09-29 DIAGNOSIS — I2510 Atherosclerotic heart disease of native coronary artery without angina pectoris: Secondary | ICD-10-CM | POA: Diagnosis not present

## 2022-09-29 DIAGNOSIS — I700 Atherosclerosis of aorta: Secondary | ICD-10-CM | POA: Diagnosis not present

## 2022-09-29 MED ORDER — IOPAMIDOL (ISOVUE-370) INJECTION 76%
75.0000 mL | Freq: Once | INTRAVENOUS | Status: AC | PRN
  Administered 2022-09-29: 75 mL via INTRAVENOUS

## 2022-10-04 ENCOUNTER — Encounter: Payer: Self-pay | Admitting: Thoracic Surgery (Cardiothoracic Vascular Surgery)

## 2022-10-04 ENCOUNTER — Ambulatory Visit: Payer: BC Managed Care – PPO | Admitting: Thoracic Surgery (Cardiothoracic Vascular Surgery)

## 2022-10-04 VITALS — BP 133/93 | HR 74 | Resp 20 | Ht 70.0 in | Wt 296.0 lb

## 2022-10-04 DIAGNOSIS — I7121 Aneurysm of the ascending aorta, without rupture: Secondary | ICD-10-CM | POA: Diagnosis not present

## 2022-10-04 NOTE — Progress Notes (Signed)
[address]       [place] [address]             [phone]     HPI: [name] returns for follow-up of his ascending aneurysm.  Daniel Acevedo is a 47 year old man with a history of tobacco abuse, hypertension, hyperlipidemia, reflux, hypothyroidism, and ascending aortic aneurysm.  First noted to have an ascending aneurysm in 2021 after workup for a syncopal episode.  He has been followed since that time.    He was last seen in the office in December 2022.  His aneurysm was 4.1 cm at that time.  In the interim since that visit he has been doing well.  He does complain of some chest discomfort that he experiences primarily when lying down at night.  No exertional chest pain, pressure, or tightness.  He continues to smoke.  Past Medical History:  Diagnosis Date   CARPAL TUNNEL SYNDROME, BILATERAL 01/04/2010   DEPRESSIVE DISORDER 04/27/2010   HYPERLIPIDEMIA 01/26/2010   Hypertension    HYPOTHYROIDISM 03/15/2010   THROMBOCYTOPENIA 01/12/2010    Current Outpatient Medications  Medication Sig Dispense Refill   amLODipine (NORVASC) 5 MG tablet TAKE 1 TABLET BY MOUTH EVERY DAY 30 tablet 5   escitalopram (LEXAPRO) 20 MG tablet Take 1 tablet (20 mg total) by mouth daily. 90 tablet 3   ezetimibe (ZETIA) 10 MG tablet TAKE 1 TABLET BY MOUTH EVERY DAY 30 tablet 5   fenofibrate 160 MG tablet TAKE 1 TABLET BY MOUTH EVERY DAY 30 tablet 2   levothyroxine (SYNTHROID) 175 MCG tablet TAKE 1 TABLET BY MOUTH EVERY DAY BEFORE BREAKFAST 30 tablet 5   lisinopril (ZESTRIL) 40 MG tablet TAKE 1 TABLET BY MOUTH EVERY DAY 30 tablet 5   pantoprazole (PROTONIX) 40 MG tablet TAKE 1 TABLET BY MOUTH EVERY DAY 30 tablet 5   rosuvastatin (CRESTOR) 40 MG tablet TAKE 1 TABLET BY MOUTH EVERYDAY AT BEDTIME 30 tablet 5   WEGOVY 2.4 MG/0.75ML SOAJ INJECT 2.4 MG INTO THE SKIN ONCE A WEEK. 3 mL 1   No current facility-administered medications for this visit.    Physical Exam BP (!) 133/93 (BP Location:  Left Arm, Patient Position: Sitting)   Pulse 74   Resp 20   Ht 5\' 10"  (1.778 m)   Wt 296 lb (134.3 kg)   SpO2 96% Comment: RA  BMI 42.59 kg/m   48 year old man in no acute distress Overweight Alert and oriented x 3 with no focal deficits Cardiac regular rate and rhythm with no murmur Lungs clear with equal breath sounds bilaterally No peripheral edema  Diagnostic Tests: CT ANGIOGRAPHY CHEST WITH CONTRAST   TECHNIQUE: Multidetector CT imaging of the chest was performed using the standard protocol during bolus administration of intravenous contrast. Multiplanar CT image reconstructions and MIPs were obtained to evaluate the vascular anatomy.   RADIATION DOSE REDUCTION: This exam was performed according to the departmental dose-optimization program which includes automated exposure control, adjustment of the mA and/or kV according to patient size and/or use of iterative reconstruction technique.   CONTRAST:  70mL ISOVUE-370 IOPAMIDOL (ISOVUE-370) INJECTION 76%   COMPARISON:  CT September 15, 2021.   FINDINGS: Cardiovascular: Aneurysmal dilation of the ascending thoracic aorta measuring 4.2 cm on image 57/4, unchanged. Mid aortic arch measures 2.5 cm in diameter. Descending thoracic aorta at the level of the pulmonary outflow tract measures 2.3 cm in diameter. Descending thoracic aorta at the level of the hiatus measures 2.2 cm.  Aortic atherosclerosis. Coronary artery calcifications of the LAD and RCA. Calcification at the inferior cavoatrial junction is again identified.   Mediastinum/Nodes: Small hiatal hernia mild distal esophageal wall thickening. No pathologically enlarged abdominal or pelvic lymph nodes.   Lungs/Pleura: No focal airspace consolidation. No pleural effusion. No pneumothorax.   Upper Abdomen: Left upper pole cortical renal scarring.   Musculoskeletal: No acute osseous abnormality.   Review of the MIP images confirms the above findings.    IMPRESSION: 1. Unchanged aneurysmal dilation of the ascending thoracic aorta measuring up to 4.2 cm. Recommend annual imaging followup by CTA or MRA. This recommendation follows 2010 ACCF/AHA/AATS/ACR/ASA/SCA/SCAI/SIR/STS/SVM Guidelines for the Diagnosis and Management of Patients with Thoracic Aortic Disease. Circulation. 2010; 121: L244-W102. Aortic aneurysm NOS (ICD10-I71.9) 2. Small hiatal hernia with mild distal esophageal wall thickening, findings can be seen in the setting of reflux esophagitis. 3. Coronary artery calcifications of the LAD and RCA, recommend ASCVD risk assessment. 4. Aortic aneurysm NOS (ICD10-I71.9). Aortic Atherosclerosis (ICD10-I70.0).     Electronically Signed   By: Maudry Mayhew M.D.   On: 09/29/2022 12:51 I personally reviewed the CT images.  There is a 4.1 to 4.2 cm ascending aneurysm.  Mild coronary calcification.  Hiatal hernia.  Impression: Daniel Acevedo is a 47 year old man with a history of tobacco abuse, hypertension, hyperlipidemia, reflux, hypothyroidism, and ascending aortic aneurysm.  Ascending aneurysm-stable at about 4.1 to 4.2 cm.  Needs continued annual follow-up  Hypertension-blood pressure elevated.  He says he had to go to work urgently last night and had not taken his blood pressure medication in over 24 hours.  Emphasized the importance of good blood pressure control.  Tobacco abuse-smoking about 2 packs/day for 23 years.  Emphasized the importance of tobacco cessation for multiple reasons including the aneurysm, blood pressure, cardiovascular disease, and the specter of emphysema and lung cancer if he continues to smoke.  Chest discomfort-experiences this when supine.  Not really exertional.  Most likely reflux related due to its positional component.  Plan: Return in 1 year with CT chest  Loreli Slot, MD [facility] [phone]

## 2022-10-11 ENCOUNTER — Encounter: Payer: Self-pay | Admitting: Family Medicine

## 2022-10-11 ENCOUNTER — Telehealth (INDEPENDENT_AMBULATORY_CARE_PROVIDER_SITE_OTHER): Payer: BC Managed Care – PPO | Admitting: Family Medicine

## 2022-10-11 DIAGNOSIS — J324 Chronic pansinusitis: Secondary | ICD-10-CM

## 2022-10-11 MED ORDER — AMOXICILLIN-POT CLAVULANATE 875-125 MG PO TABS: 1.0000 | ORAL_TABLET | Freq: Two times a day (BID) | ORAL | 0 refills | Status: DC

## 2022-10-11 MED ORDER — FLUTICASONE PROPIONATE 50 MCG/ACT NA SUSP: 2.0000 | Freq: Every day | NASAL | 6 refills | Status: DC

## 2022-10-11 NOTE — Progress Notes (Signed)
MyChart Video Visit    Virtual Visit via Video Note   This visit type was conducted due to national recommendations for restrictions regarding the COVID-19 Pandemic (e.g. social distancing) in an effort to limit this patient's exposure and mitigate transmission in our community. This patient is at least at moderate risk for complications without adequate follow up. This format is felt to be most appropriate for this patient at this time. Physical exam was limited by quality of the video and audio technology used for the visit. [name] was able to get the patient set up on a video visit.  Patient location: Home; Patient, provider, and medical scribe in visit Provider location: Office  I discussed the limitations of evaluation and management by telemedicine and the availability of in person appointments. The patient expressed understanding and agreed to proceed.  Visit Date: 10/11/22   Today's healthcare provider: Ann Held, DO     Subjective:    Patient ID: Daniel Acevedo, male    DOB: 07/24/1976, 47 y.o.   MRN: [id number]  Chief Complaint  Patient presents with   Nasal Congestion    HPI Patient is in today for a virtual visit.     Past Medical History:  Diagnosis Date   CARPAL TUNNEL SYNDROME, BILATERAL 01/04/2010   DEPRESSIVE DISORDER 04/27/2010   HYPERLIPIDEMIA 01/26/2010   Hypertension    HYPOTHYROIDISM 03/15/2010   THROMBOCYTOPENIA 01/12/2010    Past Surgical History:  Procedure Laterality Date   BONE MARROW BIOPSY      Family History  Problem Relation Age of Onset   Thyroid disease Mother        hypothyroidism   Hypertension Mother    Heart disease Father        cabg   Diabetes Father    Kidney disease Father    Hypertension Father    Hypertension Brother    Heart disease Brother 44       MI   Diabetes Brother    Hypertension Brother    Hyperlipidemia Brother     Social History   Socioeconomic History   Marital status: Married     Spouse name: Not on file   Number of children: Not on file   Years of education: Not on file   Highest education level: Not on file  Occupational History   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  Other Topics Concern   Not on file  Social History Narrative   Regular exercise-no      Social Determinants of Health   Financial Resource Strain: Not on file  Food Insecurity: Not on file  Transportation Needs: Not on file  Physical Activity: Not on file  Stress: Not on file  Social Connections: Not on file  Intimate Partner Violence: Not on file    Outpatient Medications Prior to Visit  Medication Sig Dispense Refill   amLODipine (NORVASC) 5 MG tablet TAKE 1 TABLET BY MOUTH EVERY DAY 30 tablet 5   escitalopram (LEXAPRO) 20 MG tablet Take 1 tablet (20 mg total) by mouth daily. 90 tablet 3   ezetimibe (ZETIA) 10 MG tablet TAKE 1 TABLET BY MOUTH EVERY DAY 30 tablet 5   fenofibrate 160 MG tablet TAKE 1 TABLET BY MOUTH EVERY DAY 30 tablet 2   levothyroxine (SYNTHROID) 175 MCG tablet TAKE 1 TABLET BY MOUTH EVERY DAY BEFORE BREAKFAST 30 tablet 5   lisinopril (ZESTRIL) 40 MG tablet TAKE 1 TABLET BY MOUTH EVERY DAY 30 tablet 5   pantoprazole (PROTONIX) 40 MG tablet TAKE 1 TABLET BY MOUTH EVERY DAY 30 tablet 5   rosuvastatin (CRESTOR) 40 MG tablet TAKE 1 TABLET BY MOUTH EVERYDAY AT BEDTIME 30 tablet 5   WEGOVY 2.4 MG/0.75ML SOAJ INJECT 2.4 MG INTO THE SKIN ONCE A WEEK. 3 mL 1    No facility-administered medications prior to visit.    No Known Allergies  Review of Systems  Constitutional:  Negative for fever and malaise/fatigue.  HENT:  Positive for congestion and sinus pain. Negative for sore throat.   Eyes:  Negative for blurred vision.  Respiratory:  Negative for cough and shortness of breath.   Cardiovascular:  Negative for chest pain, palpitations and leg swelling.  Gastrointestinal:  Negative for vomiting.  Musculoskeletal:  Negative for back pain.  Skin:  Negative for rash.  Neurological:  Negative for loss of consciousness and headaches.       Objective:    Physical Exam Vitals and nursing note reviewed.  HENT:     Nose:     Right Sinus: Maxillary sinus tenderness and frontal sinus tenderness present.     Left Sinus: Maxillary sinus tenderness and frontal sinus tenderness present.    There were no vitals taken for this visit. Wt Readings from Last 3 Encounters:  10/04/22 296 lb (134.3 kg)  01/20/22 (!) 318 lb 3.2 oz (144.3 kg)  11/16/21 (!) 318 lb 3.2 oz (144.3 kg)    Diabetic Foot Exam - Simple   No data filed    Lab Results  Component Value Date   WBC 8.8 04/06/2018   HGB 15.5 04/06/2018   HCT 44.4 04/06/2018   PLT 69 (L) 04/06/2018   GLUCOSE 77 01/20/2022   CHOL 133 01/20/2022   TRIG 245.0 (H) 01/20/2022   HDL 38.40 (L) 01/20/2022   LDLDIRECT 74.0 01/20/2022   LDLCALC 110 (H) 06/11/2021   ALT 24 01/20/2022   AST 26 01/20/2022   NA 141 01/20/2022   K 4.3 01/20/2022   CL 104 01/20/2022   CREATININE 1.20 01/20/2022   BUN 15 01/20/2022   CO2 28 01/20/2022   TSH 35.84 (H) 06/11/2021   HGBA1C 5.6 06/11/2021   MICROALBUR 1.4 10/10/2014    Lab Results  Component Value Date   TSH 35.84 (H) 06/11/2021   Lab Results  Component Value Date   WBC 8.8 04/06/2018   HGB 15.5 04/06/2018   HCT 44.4 04/06/2018   MCV 82.4 04/06/2018   PLT 69 (L) 04/06/2018   Lab Results  Component Value Date   NA 141 01/20/2022   K 4.3  01/20/2022   CO2 28 01/20/2022   GLUCOSE 77 01/20/2022   BUN 15 01/20/2022   CREATININE 1.20 01/20/2022   BILITOT 0.6 01/20/2022   ALKPHOS 88 01/20/2022   AST 26 01/20/2022   ALT 24 01/20/2022   PROT 7.1 01/20/2022  ALBUMIN 4.9 01/20/2022   CALCIUM 9.5 01/20/2022   GFR 74.09 01/20/2022   Lab Results  Component Value Date   CHOL 133 01/20/2022   Lab Results  Component Value Date   HDL 38.40 (L) 01/20/2022   Lab Results  Component Value Date   LDLCALC 110 (H) 06/11/2021   Lab Results  Component Value Date   TRIG 245.0 (H) 01/20/2022   Lab Results  Component Value Date   CHOLHDL 3 01/20/2022   Lab Results  Component Value Date   HGBA1C 5.6 06/11/2021       Assessment & Plan:   Problem List Items Addressed This Visit   None Visit Diagnoses     Pansinusitis, unspecified chronicity    -  Primary   Relevant Medications   amoxicillin-clavulanate (AUGMENTIN) 875-125 MG tablet   fluticasone (FLONASE) 50 MCG/ACT nasal spray       @[facility]CMEDP @  Meds ordered this encounter  Medications   amoxicillin-clavulanate (AUGMENTIN) 875-125 MG tablet    Sig: Take 1 tablet by mouth 2 (two) times daily.    Dispense:  20 tablet    Refill:  0   fluticasone (FLONASE) 50 MCG/ACT nasal spray    Sig: Place 2 sprays into both nostrils daily.    Dispense:  16 g    Refill:  6    I discussed the assessment and treatment plan with the patient. The patient was provided an opportunity to ask questions and all were answered. The patient agreed with the plan and demonstrated an understanding of the instructions.   The patient was advised to call back or seek an in-person evaluation if the symptoms worsen or if the condition fails to improve as anticipated.     I,Alexander Ruley,acting as a for Neurosurgeon, DO.,have documented all relevant documentation on the behalf of [employer], DO,as directed by  Donato Schultz, DO while in the presence of Donato Schultz, DO.    Donato Schultz, DO [facility] at Donato Schultz [phone] (phone) [phone] (fax)  [facility]

## 2022-10-11 NOTE — Progress Notes (Signed)
MyChart Video Visit    Virtual Visit via Video Note   This visit type was conducted due to national recommendations for restrictions regarding the COVID-19 Pandemic (e.g. social distancing) in an effort to limit this patient's exposure and mitigate transmission in our community. This patient is at least at moderate risk for complications without adequate follow up. This format is felt to be most appropriate for this patient at this time. Physical exam was limited by quality of the video and audio technology used for the visit. [name] was able to get the patient set up on a video visit.  Patient location: Home; Patient, provider, and medical scribe in visit Provider location: Office  I discussed the limitations of evaluation and management by telemedicine and the availability of in person appointments. The patient expressed understanding and agreed to proceed.  Visit Date: 10/11/22   Today's healthcare provider: Ann Held, DO     Subjective:    Patient ID: Daniel Acevedo, male    DOB: August 25, 1976, 47 y.o.   MRN: [id number]  Chief Complaint  Patient presents with   Nasal Congestion    HPI Patient is in today for a virtual visit.  He is experiencing cold-like symptoms as well as left ear pain and reduced hearing in the left ear. He is taking Aleve-D to manage cold symptoms. He denies having fevers. He had a negative at-home Covid-19 test last Thursday.   Past Medical History:  Diagnosis Date   CARPAL TUNNEL SYNDROME, BILATERAL 01/04/2010   DEPRESSIVE DISORDER 04/27/2010   HYPERLIPIDEMIA 01/26/2010   Hypertension    HYPOTHYROIDISM 03/15/2010   THROMBOCYTOPENIA 01/12/2010    Past Surgical History:  Procedure Laterality Date   BONE MARROW BIOPSY      Family History  Problem Relation Age of Onset   Thyroid disease Mother        hypothyroidism   Hypertension Mother    Heart disease Father        cabg   Diabetes Father    Kidney disease Father    Hypertension  Father    Hypertension Brother    Heart disease Brother 19       MI   Diabetes Brother    Hypertension Brother    Hyperlipidemia Brother     Social History   Socioeconomic History   Marital status: Married    Spouse name: Not on file   Number of children: Not on file   Years of education: Not on file   Highest education level: Not on file  Occupational History   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  Other Topics Concern   Not on file  Social History Narrative   Regular exercise-no      Social Determinants of Health   Financial Resource Strain: Not on file  Food Insecurity: Not on file  Transportation Needs: Not on file  Physical Activity: Not on file  Stress: Not on file  Social Connections: Not on file  Intimate Partner Violence: Not on file    Outpatient Medications Prior to Visit  Medication Sig Dispense Refill   amLODipine (NORVASC) 5 MG tablet TAKE 1 TABLET BY MOUTH EVERY DAY 30 tablet 5   escitalopram (LEXAPRO) 20 MG tablet Take 1 tablet (20 mg total) by mouth daily. 90 tablet 3   ezetimibe (ZETIA) 10 MG tablet TAKE 1 TABLET BY MOUTH EVERY DAY 30 tablet 5   fenofibrate 160 MG tablet TAKE 1 TABLET BY MOUTH EVERY DAY 30 tablet 2   levothyroxine (SYNTHROID) 175 MCG tablet TAKE 1 TABLET BY MOUTH EVERY DAY BEFORE BREAKFAST 30 tablet 5   lisinopril (ZESTRIL) 40 MG tablet TAKE 1 TABLET BY MOUTH EVERY DAY 30 tablet 5   pantoprazole (PROTONIX) 40 MG  tablet TAKE 1 TABLET BY MOUTH EVERY DAY 30 tablet 5   rosuvastatin (CRESTOR) 40 MG tablet TAKE 1 TABLET BY MOUTH EVERYDAY AT BEDTIME 30 tablet 5   WEGOVY 2.4 MG/0.75ML SOAJ INJECT 2.4 MG INTO THE SKIN ONCE A WEEK. 3 mL 1   No facility-administered medications prior to visit.    No Known Allergies  Review of Systems  Constitutional:  Negative for fever.  HENT:  Positive for congestion, ear pain (Left ear), hearing loss (Left ear) and sinus pain.   Respiratory:  Positive for cough.        Objective:    Physical Exam Vitals and nursing note reviewed.  Constitutional:      General: He is not in acute distress.    Appearance: Normal appearance. He is not ill-appearing.  HENT:     Head: Normocephalic and atraumatic.  Pulmonary:     Effort: Pulmonary effort is normal.  Neurological:     Mental Status: He is alert and oriented to person, place, and time.  Psychiatric:        Judgment: Judgment normal.     There were no vitals taken for this visit. Wt Readings from Last 3 Encounters:  10/04/22 296 lb (134.3 kg)  01/20/22 (!) 318 lb 3.2 oz (144.3 kg)  11/16/21 (!) 318 lb 3.2 oz (144.3 kg)    Diabetic Foot Exam - Simple   No data filed    Lab Results  Component Value Date   WBC 8.8 04/06/2018   HGB 15.5 04/06/2018   HCT 44.4 04/06/2018   PLT 69 (L) 04/06/2018   GLUCOSE 77 01/20/2022   CHOL 133 01/20/2022   TRIG 245.0 (H) 01/20/2022   HDL 38.40 (L) 01/20/2022   LDLDIRECT 74.0 01/20/2022   LDLCALC 110 (H) 06/11/2021   ALT 24 01/20/2022   AST 26 01/20/2022   NA 141 01/20/2022   K 4.3 01/20/2022   CL 104 01/20/2022   CREATININE 1.20 01/20/2022   BUN 15 01/20/2022   CO2 28 01/20/2022   TSH 35.84 (H) 06/11/2021   HGBA1C 5.6 06/11/2021   MICROALBUR 1.4 10/10/2014    Lab Results  Component Value Date   TSH 35.84 (H) 06/11/2021   Lab Results  Component Value Date   WBC 8.8 04/06/2018   HGB 15.5 04/06/2018   HCT 44.4 04/06/2018   MCV 82.4 04/06/2018   PLT 69  (L) 04/06/2018   Lab Results  Component Value Date   NA 141 01/20/2022   K 4.3 01/20/2022   CO2 28 01/20/2022   GLUCOSE 77 01/20/2022   BUN 15 01/20/2022  CREATININE 1.20 01/20/2022   BILITOT 0.6 01/20/2022   ALKPHOS 88 01/20/2022   AST 26 01/20/2022   ALT 24 01/20/2022   PROT 7.1 01/20/2022   ALBUMIN 4.9 01/20/2022   CALCIUM 9.5 01/20/2022   GFR 74.09 01/20/2022   Lab Results  Component Value Date   CHOL 133 01/20/2022   Lab Results  Component Value Date   HDL 38.40 (L) 01/20/2022   Lab Results  Component Value Date   LDLCALC 110 (H) 06/11/2021   Lab Results  Component Value Date   TRIG 245.0 (H) 01/20/2022   Lab Results  Component Value Date   CHOLHDL 3 01/20/2022   Lab Results  Component Value Date   HGBA1C 5.6 06/11/2021       Assessment & Plan:   Problem List Items Addressed This Visit   None Visit Diagnoses     Pansinusitis, unspecified chronicity    -  Primary   Relevant Medications   amoxicillin-clavulanate (AUGMENTIN) 875-125 MG tablet   fluticasone (FLONASE) 50 MCG/ACT nasal spray        Meds ordered this encounter  Medications   amoxicillin-clavulanate (AUGMENTIN) 875-125 MG tablet    Sig: Take 1 tablet by mouth 2 (two) times daily.    Dispense:  20 tablet    Refill:  0   fluticasone (FLONASE) 50 MCG/ACT nasal spray    Sig: Place 2 sprays into both nostrils daily.    Dispense:  16 g    Refill:  6    I discussed the assessment and treatment plan with the patient. The patient was provided an opportunity to ask questions and all were answered. The patient agreed with the plan and demonstrated an understanding of the instructions.   The patient was advised to call back or seek an in-person evaluation if the symptoms worsen or if the condition fails to improve as anticipated.     I,Alexander Ruley,acting as a Neurosurgeon for [employer], DO.,have documented all relevant documentation on the behalf of Donato Schultz, DO,as  directed by  Donato Schultz, DO while in the presence of Donato Schultz, DO.   I, Donato Schultz, DO, have reviewed all documentation for this visit. The documentation on 10/11/22 for the exam, diagnosis, procedures, and orders are all accurate and complete.    Donato Schultz, DO [facility] [phone] (phone) [phone] (fax)  [facility]

## 2023-01-20 ENCOUNTER — Ambulatory Visit: Payer: BC Managed Care – PPO | Admitting: Family Medicine

## 2023-01-20 ENCOUNTER — Encounter: Payer: Self-pay | Admitting: Family Medicine

## 2023-01-20 ENCOUNTER — Ambulatory Visit (HOSPITAL_BASED_OUTPATIENT_CLINIC_OR_DEPARTMENT_OTHER)
Admission: RE | Admit: 2023-01-20 | Discharge: 2023-01-20 | Disposition: A | Discharge status: Home / Self Care | Payer: BC Managed Care – PPO | Source: Ambulatory Visit | Attending: Family Medicine | Admitting: Family Medicine

## 2023-01-20 DIAGNOSIS — Z720 Tobacco use: Secondary | ICD-10-CM | POA: Insufficient documentation

## 2023-01-20 DIAGNOSIS — R062 Wheezing: Secondary | ICD-10-CM | POA: Diagnosis not present

## 2023-01-20 LAB — COMPREHENSIVE METABOLIC PANEL
ALT: 22 U/L (ref 0–53)
AST: 22 U/L (ref 0–37)
Albumin: 4.6 g/dL (ref 3.5–5.2)
Alkaline Phosphatase: 73 U/L (ref 39–117)
BUN: 20 mg/dL (ref 6–23)
CO2: 26 mEq/L (ref 19–32)
Calcium: 9.9 mg/dL (ref 8.4–10.5)
Chloride: 105 mEq/L (ref 96–112)
Creatinine, Ser: 1.2 mg/dL (ref 0.40–1.50)
GFR: 73.57 mL/min (ref >60.00)
Glucose, Bld: 95 mg/dL (ref 70–99)
Potassium: 4.3 mEq/L (ref 3.5–5.1)
Sodium: 140 mEq/L (ref 135–145)
Total Bilirubin: 0.6 mg/dL (ref 0.2–1.2)
Total Protein: 7 g/dL (ref 6.0–8.3)

## 2023-01-20 LAB — CBC WITH DIFFERENTIAL/PLATELET
Basophils Absolute: 0 10*3/uL (ref 0.0–0.1)
Basophils Relative: 0.3 % (ref 0.0–3.0)
Eosinophils Absolute: 0.5 10*3/uL (ref 0.0–0.7)
Eosinophils Relative: 5.2 % — ABNORMAL HIGH (ref 0.0–5.0)
HCT: 40.8 % (ref 39.0–52.0)
Hemoglobin: 14.4 g/dL (ref 13.0–17.0)
Lymphocytes Relative: 22.2 % (ref 12.0–46.0)
Lymphs Abs: 2 10*3/uL (ref 0.7–4.0)
MCHC: 35.4 g/dL (ref 30.0–36.0)
MCV: 86 fl (ref 78.0–100.0)
Monocytes Absolute: 0.5 10*3/uL (ref 0.1–1.0)
Monocytes Relative: 5.8 % (ref 3.0–12.0)
Neutro Abs: 5.9 10*3/uL (ref 1.4–7.7)
Neutrophils Relative %: 66.5 % (ref 43.0–77.0)
Platelets: 120 10*3/uL — ABNORMAL LOW (ref 150.0–400.0)
RBC: 4.74 Mil/uL (ref 4.22–5.81)
RDW: 13.9 % (ref 11.5–15.5)
WBC: 8.8 10*3/uL (ref 4.0–10.5)

## 2023-01-20 LAB — LIPID PANEL
Cholesterol: 104 mg/dL (ref 0–200)
HDL: 40.1 mg/dL (ref >39.00)
LDL Cholesterol: 47 mg/dL (ref 0–99)
NonHDL: 63.4
Total CHOL/HDL Ratio: 3
Triglycerides: 84 mg/dL (ref 0.0–149.0)
VLDL: 16.8 mg/dL (ref 0.0–40.0)

## 2023-01-20 LAB — HEMOGLOBIN A1C: Hgb A1c MFr Bld: 5.6 % (ref 4.6–6.5)

## 2023-01-20 LAB — TSH: TSH: 46.31 u[IU]/mL — ABNORMAL HIGH (ref 0.35–5.50)

## 2023-01-20 LAB — VITAMIN D 25 HYDROXY (VIT D DEFICIENCY, FRACTURES): VITD: 9.82 ng/mL — ABNORMAL LOW (ref 30.00–100.00)

## 2023-01-20 MED ORDER — ZEPBOUND 2.5 MG/0.5ML ~~LOC~~ SOAJ
2.5000 mg | SUBCUTANEOUS | 0 refills | Status: DC
Start: 2023-01-20 — End: 2023-02-03

## 2023-01-20 NOTE — Assessment & Plan Note (Signed)
Try zepbound  F/u 3 months Encouraged pt to eat enough protein and vegetables

## 2023-01-20 NOTE — Progress Notes (Signed)
Established Patient Office Visit  Subjective   Patient ID: Daniel Acevedo, male    DOB: 09-15-1976  Age: 47 y.o. MRN: [id number]  Chief Complaint  Patient presents with   Weight Check    Pt states no longer taking the [facility] [place] due to side effects about 3 to 4 weeks ago.     HPI Pt is here f/u wegovy--- the 2.4 made him sick.  He is asking if there is anything else  Patient Active Problem List   Diagnosis Date Noted   Low back pain 01/20/2022   Morbid obesity (HCC) 01/20/2022   Thoracic aortic aneurysm without rupture (HCC) 06/11/2021   Sleep deprivation 12/01/2019   Hypersomnia with sleep apnea 12/01/2019   Preventative health care 07/02/2017   HTN (hypertension) 10/09/2015   Bronchitis 12/04/2013   Otitis media 12/04/2013   Severe obesity (BMI >= 40) (HCC) 08/01/2013   Anxiety 11/01/2012   DEPRESSIVE DISORDER 04/27/2010   Hypothyroidism (acquired) 03/15/2010   Hyperlipidemia 01/26/2010   THROMBOCYTOPENIA 01/12/2010   TOBACCO USE 01/04/2010   CARPAL TUNNEL SYNDROME, BILATERAL 01/04/2010   SNORING 01/04/2010   COUGH 01/04/2010   Past Medical History:  Diagnosis Date   CARPAL TUNNEL SYNDROME, BILATERAL 01/04/2010   DEPRESSIVE DISORDER 04/27/2010   HYPERLIPIDEMIA 01/26/2010   Hypertension    HYPOTHYROIDISM 03/15/2010   THROMBOCYTOPENIA 01/12/2010   Past Surgical History:  Procedure Laterality Date   BONE MARROW BIOPSY     Social History   Tobacco Use   Smoking status: Smoker, Current Status Unknown    Packs/day: 2.00    Years: 23.00    Additional pack years: 0.00    Total pack years: 46.00    Types: Cigarettes   Smokeless tobacco: Never   Tobacco comments:    pt has tried many otc--- and wellbutrin and chantix with no success  Vaping Use   Vaping Use: Never used  Substance Use Topics   Alcohol use: Yes    Alcohol/week: 0.0 standard drinks of alcohol    Comment: occasional   Drug use: No   Social History   Socioeconomic History   Marital status: Married     Spouse name: Not on file   Number of children: Not on file   Years of education: Not on file   Highest education level: Not on file  Occupational History   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  Tobacco Use   Smoking status: Smoker, Current Status Unknown    Packs/day: 2.00    Years: 23.00    Additional pack years: 0.00    Total pack years: 46.00    Types: Cigarettes   Smokeless tobacco: Never   Tobacco comments:    pt has tried many otc--- and wellbutrin and chantix with no success  Vaping Use   Vaping Use: Never used  Substance and Sexual Activity   Alcohol use: Yes    Alcohol/week: 0.0 standard drinks of alcohol    Comment: occasional   Drug use: No   Sexual activity: Yes    Partners: Female  Other Topics Concern   Not on file  Social History Narrative   Regular exercise-no      Social Determinants of Health   Financial Resource Strain: Not on file  Food Insecurity: Not on file  Transportation Needs: Not on file  Physical Activity: Not on file  Stress: Not on file  Social Connections: Not on file  Intimate Partner Violence: Not on file  Family Status  Relation Name Status   Mother  Alive   Father  Deceased at age 57   Brother  Alive   Brother  Deceased at age 67   Family History  Problem Relation Age of Onset   Thyroid disease Mother        hypothyroidism   Hypertension Mother    Heart disease Father        cabg   Diabetes Father    Kidney disease Father    Hypertension Father    Hypertension Brother    Heart disease Brother 97       MI   Diabetes Brother    Hypertension Brother    Hyperlipidemia Brother    No Known Allergies  ROS    Objective:     BP 98/80 (BP Location: Left Arm, Patient Position: Sitting, Cuff Size: Large)   Pulse 84   Temp 98.5 F (36.9 C) (Oral)   Resp 18   Ht 5\' 10"  (1.778 m)   Wt (!) 315 lb 6.4 oz (143.1 kg)   SpO2 97%   BMI 45.26 kg/m  BP Readings from Last 3 Encounters:   01/20/23 98/80  10/04/22 (!) 133/93  03/09/22 115/78   Wt Readings from Last 3 Encounters:  01/20/23 (!) 315 lb 6.4 oz (143.1 kg)  10/04/22 296 lb (134.3 kg)  01/20/22 (!) 318 lb 3.2 oz (144.3 kg)   SpO2 Readings from Last 3 Encounters:  01/20/23 97%  10/04/22 96%  01/20/22 98%      Physical Exam Vitals and nursing note reviewed.  Constitutional:      General: He is not in acute distress.    Appearance: He is well-developed. He is not diaphoretic.  HENT:     Head: Normocephalic and atraumatic.     Right Ear: External ear normal.     Left Ear: External ear normal.     Nose: Nose normal.     Mouth/Throat:     Pharynx: No oropharyngeal exudate.  Eyes:     General:        Right eye: No discharge.        Left eye: No discharge.     Conjunctiva/sclera: Conjunctivae normal.     Pupils: Pupils are equal, round, and reactive to light.  Neck:     Thyroid: No thyromegaly.     Vascular: No JVD.  Cardiovascular:     Rate and Rhythm: Normal rate and regular rhythm.     Heart sounds: No murmur heard. Pulmonary:     Effort: Pulmonary effort is normal. No respiratory distress.     Breath sounds: Normal breath sounds. No wheezing or rales.  Chest:     Chest wall: No tenderness.  Abdominal:     General: Bowel sounds are normal. There is no distension.     Palpations: Abdomen is soft. There is no mass.     Tenderness: There is no abdominal tenderness. There is no guarding or rebound.  Musculoskeletal:     Cervical back: Normal range of motion and neck supple.  Lymphadenopathy:     Cervical: No cervical adenopathy.  Neurological:     Mental Status: He is alert.     Motor: No abnormal muscle tone.  Psychiatric:        Behavior: Behavior normal.        Thought Content: Thought content normal.        Judgment: Judgment normal.      No results found for any visits  on 01/20/23.  Last CBC Lab Results  Component Value Date   WBC 8.8 04/06/2018   HGB 15.5 04/06/2018   HCT  44.4 04/06/2018   MCV 82.4 04/06/2018   MCH 28.8 04/06/2018   RDW 13.4 04/06/2018   PLT 69 (L) 04/06/2018   Last metabolic panel Lab Results  Component Value Date   GLUCOSE 77 01/20/2022   NA 141 01/20/2022   K 4.3 01/20/2022   CL 104 01/20/2022   CO2 28 01/20/2022   BUN 15 01/20/2022   CREATININE 1.20 01/20/2022   CALCIUM 9.5 01/20/2022   PROT 7.1 01/20/2022   ALBUMIN 4.9 01/20/2022   BILITOT 0.6 01/20/2022   ALKPHOS 88 01/20/2022   AST 26 01/20/2022   ALT 24 01/20/2022   Last lipids Lab Results  Component Value Date   CHOL 133 01/20/2022   HDL 38.40 (L) 01/20/2022   LDLCALC 110 (H) 06/11/2021   LDLDIRECT 74.0 01/20/2022   TRIG 245.0 (H) 01/20/2022   CHOLHDL 3 01/20/2022   Last hemoglobin A1c Lab Results  Component Value Date   HGBA1C 5.6 06/11/2021   Last thyroid functions Lab Results  Component Value Date   TSH 35.84 (H) 06/11/2021   Last vitamin D No results found for: "25OHVITD2", "25OHVITD3", "VD25OH" Last vitamin B12 and Folate Lab Results  Component Value Date   VITAMINB12 908 08/01/2013      The 10-year ASCVD risk score (Arnett DK, et al., 2019) is: 2.5%    Assessment & Plan:   Problem List Items Addressed This Visit       Unprioritized   Morbid obesity (HCC) - Primary    Try zepbound  F/u 3 months Encouraged pt to eat enough protein and vegetables       Relevant Medications   tirzepatide (ZEPBOUND) 2.5 MG/0.5ML Pen   Other Relevant Orders   CBC with Differential/Platelet   Comprehensive metabolic panel   Lipid panel   TSH   Hemoglobin A1c   VITAMIN D 25 Hydroxy (Vit-D Deficiency, Fractures)   Insulin, random   Other Visit Diagnoses     Wheezing       Relevant Orders   DG Chest 2 View   Tobacco abuse       Relevant Orders   DG Chest 2 View       No follow-ups on file.    Donato Schultz, DO

## 2023-01-23 LAB — INSULIN, RANDOM: Insulin: 37.4 u[IU]/mL — ABNORMAL HIGH

## 2023-01-25 ENCOUNTER — Other Ambulatory Visit: Payer: Self-pay

## 2023-01-25 ENCOUNTER — Encounter: Payer: Self-pay | Admitting: Family Medicine

## 2023-01-25 DIAGNOSIS — E039 Hypothyroidism, unspecified: Secondary | ICD-10-CM

## 2023-01-25 MED ORDER — LEVOTHYROXINE SODIUM 200 MCG PO TABS: 200.0000 ug | ORAL_TABLET | Freq: Every day | ORAL | 2 refills | Status: DC

## 2023-01-30 ENCOUNTER — Other Ambulatory Visit (HOSPITAL_COMMUNITY): Payer: Self-pay

## 2023-01-30 ENCOUNTER — Telehealth: Payer: Self-pay

## 2023-01-30 NOTE — Telephone Encounter (Signed)
Per insurance for [employer] coverage:  Will the requested medication be used in combination with a lifestyle modification program that encourages reduced calorie diet and increased physical activity (e.g., increased physical activity, nutritional counseling, participation in a comprehensive weight management program)?

## 2023-01-30 NOTE — Telephone Encounter (Signed)
PA request received via provider for Zepbound 2.5MG /0.5ML pen-injectors  Key: [facility]  PA not yet submitted due to question of:   Will the requested medication be used in combination with a lifestyle modification program that encourages reduced calorie diet and increased physical activity (e.g., increased physical activity, nutritional counseling, participation in a comprehensive weight management program)?  Pending provider answer to this question prior to submitting

## 2023-02-01 NOTE — Telephone Encounter (Signed)
Answered yes, as there are notes about discussing dietary changes and increased exercise, PA is pending.

## 2023-02-02 ENCOUNTER — Other Ambulatory Visit (HOSPITAL_COMMUNITY): Payer: Self-pay

## 2023-02-02 NOTE — Telephone Encounter (Signed)
Patient Advocate Encounter  Prior Authorization for Zepbound 2.5MG /0.5ML pen-injectors has been approved with [employer].    PA# [id number] Effective dates: 02/02/23 through 09/02/23  Per [facility] test claim, copay for 28 days supply is $24.99 (after voucher)

## 2023-02-03 MED ORDER — ZEPBOUND 5 MG/0.5ML ~~LOC~~ SOAJ: 5.0000 mg | SUBCUTANEOUS | 0 refills | Status: DC

## 2023-02-17 ENCOUNTER — Other Ambulatory Visit: Payer: Self-pay | Admitting: Family Medicine

## 2023-02-17 DIAGNOSIS — E785 Hyperlipidemia, unspecified: Secondary | ICD-10-CM

## 2023-02-26 ENCOUNTER — Other Ambulatory Visit: Payer: Self-pay | Admitting: Family Medicine

## 2023-02-27 NOTE — Telephone Encounter (Signed)
Patient comment: Out of the medicine, needs to be uped in doseage, please

## 2023-02-28 ENCOUNTER — Other Ambulatory Visit: Payer: Self-pay | Admitting: Family Medicine

## 2023-02-28 MED ORDER — ZEPBOUND 7.5 MG/0.5ML ~~LOC~~ SOAJ
7.5000 mg | SUBCUTANEOUS | 0 refills | Status: DC
Start: 2023-02-28 — End: 2023-03-29

## 2023-03-13 ENCOUNTER — Other Ambulatory Visit: Payer: Self-pay | Admitting: Family Medicine

## 2023-03-13 DIAGNOSIS — I10 Essential (primary) hypertension: Secondary | ICD-10-CM

## 2023-03-29 ENCOUNTER — Other Ambulatory Visit: Payer: Self-pay | Admitting: Family Medicine

## 2023-03-29 MED ORDER — ZEPBOUND 10 MG/0.5ML ~~LOC~~ SOAJ: 10.0000 mg | SUBCUTANEOUS | 0 refills | Status: DC

## 2023-04-13 ENCOUNTER — Other Ambulatory Visit: Payer: Self-pay | Admitting: Family Medicine

## 2023-04-18 MED ORDER — ZEPBOUND 10 MG/0.5ML ~~LOC~~ SOAJ: 10.0000 mg | SUBCUTANEOUS | 0 refills | Status: DC

## 2023-04-21 ENCOUNTER — Other Ambulatory Visit: Payer: Self-pay | Admitting: Family Medicine

## 2023-04-21 DIAGNOSIS — E785 Hyperlipidemia, unspecified: Secondary | ICD-10-CM

## 2023-04-21 DIAGNOSIS — R1013 Epigastric pain: Secondary | ICD-10-CM

## 2023-04-21 DIAGNOSIS — I10 Essential (primary) hypertension: Secondary | ICD-10-CM

## 2023-04-24 ENCOUNTER — Other Ambulatory Visit (HOSPITAL_COMMUNITY): Payer: Self-pay

## 2023-04-24 ENCOUNTER — Other Ambulatory Visit: Payer: Self-pay

## 2023-04-24 MED ORDER — ZEPBOUND 10 MG/0.5ML ~~LOC~~ SOAJ
10.0000 mg | SUBCUTANEOUS | 0 refills | Status: DC
  Filled 2023-04-24: qty 2, 28d supply, fill #0

## 2023-04-28 ENCOUNTER — Other Ambulatory Visit: Payer: Self-pay | Admitting: Family Medicine

## 2023-04-28 ENCOUNTER — Other Ambulatory Visit (HOSPITAL_COMMUNITY): Payer: Self-pay

## 2023-04-28 ENCOUNTER — Encounter: Payer: Self-pay | Admitting: Family Medicine

## 2023-04-28 DIAGNOSIS — E785 Hyperlipidemia, unspecified: Secondary | ICD-10-CM

## 2023-05-02 ENCOUNTER — Other Ambulatory Visit: Payer: Self-pay

## 2023-05-02 ENCOUNTER — Encounter: Payer: Self-pay | Admitting: Pharmacist

## 2023-05-02 MED ORDER — WEGOVY 0.5 MG/0.5ML ~~LOC~~ SOAJ: 0.5000 mg | SUBCUTANEOUS | 0 refills | Status: DC

## 2023-05-03 ENCOUNTER — Other Ambulatory Visit (HOSPITAL_COMMUNITY): Payer: Self-pay

## 2023-05-09 ENCOUNTER — Other Ambulatory Visit: Payer: Self-pay | Admitting: Family Medicine

## 2023-05-10 ENCOUNTER — Other Ambulatory Visit: Payer: Self-pay | Admitting: Family Medicine

## 2023-05-22 ENCOUNTER — Other Ambulatory Visit: Payer: Self-pay | Admitting: Family Medicine

## 2023-05-23 ENCOUNTER — Other Ambulatory Visit (HOSPITAL_COMMUNITY): Payer: Self-pay

## 2023-05-23 ENCOUNTER — Other Ambulatory Visit (HOSPITAL_BASED_OUTPATIENT_CLINIC_OR_DEPARTMENT_OTHER): Payer: Self-pay

## 2023-05-23 MED ORDER — ZEPBOUND 12.5 MG/0.5ML ~~LOC~~ SOAJ
12.5000 mg | SUBCUTANEOUS | 0 refills | Status: DC
  Filled 2023-05-23: qty 2, 28d supply, fill #0

## 2023-05-26 ENCOUNTER — Other Ambulatory Visit (HOSPITAL_COMMUNITY): Payer: Self-pay

## 2023-07-10 ENCOUNTER — Other Ambulatory Visit (HOSPITAL_BASED_OUTPATIENT_CLINIC_OR_DEPARTMENT_OTHER): Payer: Self-pay

## 2023-07-10 ENCOUNTER — Ambulatory Visit: Payer: BC Managed Care – PPO | Admitting: Family Medicine

## 2023-07-10 ENCOUNTER — Encounter: Payer: Self-pay | Admitting: Family Medicine

## 2023-07-10 DIAGNOSIS — E785 Hyperlipidemia, unspecified: Secondary | ICD-10-CM

## 2023-07-10 DIAGNOSIS — E039 Hypothyroidism, unspecified: Secondary | ICD-10-CM | POA: Diagnosis not present

## 2023-07-10 DIAGNOSIS — I10 Essential (primary) hypertension: Secondary | ICD-10-CM | POA: Diagnosis not present

## 2023-07-10 LAB — LIPID PANEL
Cholesterol: 109 mg/dL (ref 0–200)
HDL: 37.1 mg/dL — ABNORMAL LOW (ref >39.00)
LDL Cholesterol: 46 mg/dL (ref 0–99)
NonHDL: 71.44
Total CHOL/HDL Ratio: 3
Triglycerides: 127 mg/dL (ref 0.0–149.0)
VLDL: 25.4 mg/dL (ref 0.0–40.0)

## 2023-07-10 LAB — COMPREHENSIVE METABOLIC PANEL
ALT: 27 U/L (ref 0–53)
AST: 24 U/L (ref 0–37)
Albumin: 4.6 g/dL (ref 3.5–5.2)
Alkaline Phosphatase: 87 U/L (ref 39–117)
BUN: 15 mg/dL (ref 6–23)
CO2: 28 meq/L (ref 19–32)
Calcium: 10.1 mg/dL (ref 8.4–10.5)
Chloride: 106 meq/L (ref 96–112)
Creatinine, Ser: 1.3 mg/dL (ref 0.40–1.50)
GFR: 66.61 mL/min (ref >60.00)
Glucose, Bld: 103 mg/dL — ABNORMAL HIGH (ref 70–99)
Potassium: 4.4 meq/L (ref 3.5–5.1)
Sodium: 142 meq/L (ref 135–145)
Total Bilirubin: 0.8 mg/dL (ref 0.2–1.2)
Total Protein: 7 g/dL (ref 6.0–8.3)

## 2023-07-10 LAB — CBC WITH DIFFERENTIAL/PLATELET
Basophils Absolute: 0 10*3/uL (ref 0.0–0.1)
Basophils Relative: 0.1 % (ref 0.0–3.0)
Eosinophils Absolute: 0.1 10*3/uL (ref 0.0–0.7)
Eosinophils Relative: 1.6 % (ref 0.0–5.0)
HCT: 48.8 % (ref 39.0–52.0)
Hemoglobin: 16.6 g/dL (ref 13.0–17.0)
Lymphocytes Relative: 18.1 % (ref 12.0–46.0)
Lymphs Abs: 1.6 10*3/uL (ref 0.7–4.0)
MCHC: 34 g/dL (ref 30.0–36.0)
MCV: 85.4 fL (ref 78.0–100.0)
Monocytes Absolute: 0.5 10*3/uL (ref 0.1–1.0)
Monocytes Relative: 6 % (ref 3.0–12.0)
Neutro Abs: 6.4 10*3/uL (ref 1.4–7.7)
Neutrophils Relative %: 74.2 % (ref 43.0–77.0)
Platelets: 85 10*3/uL — ABNORMAL LOW (ref 150.0–400.0)
RBC: 5.71 Mil/uL (ref 4.22–5.81)
RDW: 14 % (ref 11.5–15.5)
WBC: 8.7 10*3/uL (ref 4.0–10.5)

## 2023-07-10 LAB — TSH: TSH: 1.63 u[IU]/mL (ref 0.35–5.50)

## 2023-07-10 MED ORDER — ZEPBOUND 12.5 MG/0.5ML ~~LOC~~ SOAJ
12.5000 mg | SUBCUTANEOUS | 5 refills | Status: DC
Start: 2023-07-10 — End: 2023-12-04
  Filled 2023-07-10: qty 2, 28d supply, fill #0
  Filled 2023-08-08: qty 2, 28d supply, fill #1
  Filled 2023-09-01: qty 2, 28d supply, fill #2
  Filled 2023-09-29: qty 2, 28d supply, fill #3

## 2023-07-10 NOTE — Patient Instructions (Signed)
Calorie Counting for Weight Loss Calories are units of energy. Your body needs a certain number of calories from food to keep going throughout the day. When you eat or drink more calories than your body needs, your body stores the extra calories mostly as fat. When you eat or drink fewer calories than your body needs, your body burns fat to get the energy it needs. Calorie counting means keeping track of how many calories you eat and drink each day. Calorie counting can be helpful if you need to lose weight. If you eat fewer calories than your body needs, you should lose weight. Ask your health care provider what a healthy weight is for you. For calorie counting to work, you will need to eat the right number of calories each day to lose a healthy amount of weight per week. A dietitian can help you figure out how many calories you need in a day and will suggest ways to reach your calorie goal. A healthy amount of weight to lose each week is usually 1-2 lb (0.5-0.9 kg). This usually means that your daily calorie intake should be reduced by 500-750 calories. Eating 1,200-1,500 calories a day can help most women lose weight. Eating 1,500-1,800 calories a day can help most men lose weight. What do I need to know about calorie counting? Work with your health care provider or dietitian to determine how many calories you should get each day. To meet your daily calorie goal, you will need to: Find out how many calories are in each food that you would like to eat. Try to do this before you eat. Decide how much of the food you plan to eat. Keep a food log. Do this by writing down what you ate and how many calories it had. To successfully lose weight, it is important to balance calorie counting with a healthy lifestyle that includes regular activity. Where do I find calorie information?  The number of calories in a food can be found on a Nutrition Facts label. If a food does not have a Nutrition Facts label, try  to look up the calories online or ask your dietitian for help. Remember that calories are listed per serving. If you choose to have more than one serving of a food, you will have to multiply the calories per serving by the number of servings you plan to eat. For example, the label on a package of bread might say that a serving size is 1 slice and that there are 90 calories in a serving. If you eat 1 slice, you will have eaten 90 calories. If you eat 2 slices, you will have eaten 180 calories. How do I keep a food log? After each time that you eat, record the following in your food log as soon as possible: What you ate. Be sure to include toppings, sauces, and other extras on the food. How much you ate. This can be measured in cups, ounces, or number of items. How many calories were in each food and drink. The total number of calories in the food you ate. Keep your food log near you, such as in a pocket-sized notebook or on an app or website on your mobile phone. Some programs will calculate calories for you and show you how many calories you have left to meet your daily goal. What are some portion-control tips? Know how many calories are in a serving. This will help you know how many servings you can have of a certain  food. Use a measuring cup to measure serving sizes. You could also try weighing out portions on a kitchen scale. With time, you will be able to estimate serving sizes for some foods. Take time to put servings of different foods on your favorite plates or in your favorite bowls and cups so you know what a serving looks like. Try not to eat straight from a food's packaging, such as from a bag or box. Eating straight from the package makes it hard to see how much you are eating and can lead to overeating. Put the amount you would like to eat in a cup or on a plate to make sure you are eating the right portion. Use smaller plates, glasses, and bowls for smaller portions and to prevent  overeating. Try not to multitask. For example, avoid watching TV or using your computer while eating. If it is time to eat, sit down at a table and enjoy your food. This will help you recognize when you are full. It will also help you be more mindful of what and how much you are eating. What are tips for following this plan? Reading food labels Check the calorie count compared with the serving size. The serving size may be smaller than what you are used to eating. Check the source of the calories. Try to choose foods that are high in protein, fiber, and vitamins, and low in saturated fat, trans fat, and sodium. Shopping Read nutrition labels while you shop. This will help you make healthy decisions about which foods to buy. Pay attention to nutrition labels for low-fat or fat-free foods. These foods sometimes have the same number of calories or more calories than the full-fat versions. They also often have added sugar, starch, or salt to make up for flavor that was removed with the fat. Make a grocery list of lower-calorie foods and stick to it. Cooking Try to cook your favorite foods in a healthier way. For example, try baking instead of frying. Use low-fat dairy products. Meal planning Use more fruits and vegetables. One-half of your plate should be fruits and vegetables. Include lean proteins, such as chicken, [place], and fish. Lifestyle Each week, aim to do one of the following: 150 minutes of moderate exercise, such as walking. 75 minutes of vigorous exercise, such as running. General information Know how many calories are in the foods you eat most often. This will help you calculate calorie counts faster. Find a way of tracking calories that works for you. Get creative. Try different apps or programs if writing down calories does not work for you. What foods should I eat?  Eat nutritious foods. It is better to have a nutritious, high-calorie food, such as an avocado, than a food with  few nutrients, such as a bag of potato chips. Use your calories on foods and drinks that will fill you up and will not leave you hungry soon after eating. Examples of foods that fill you up are nuts and nut butters, vegetables, lean proteins, and high-fiber foods such as whole grains. High-fiber foods are foods with more than 5 g of fiber per serving. Pay attention to calories in drinks. Low-calorie drinks include water and unsweetened drinks. The items listed above may not be a complete list of foods and beverages you can eat. Contact a dietitian for more information. What foods should I limit? Limit foods or drinks that are not good sources of vitamins, minerals, or protein or that are high in unhealthy fats. These  include: Candy. Other sweets. Sodas, specialty coffee drinks, alcohol, and juice. The items listed above may not be a complete list of foods and beverages you should avoid. Contact a dietitian for more information. How do I count calories when eating out? Pay attention to portions. Often, portions are much larger when eating out. Try these tips to keep portions smaller: Consider sharing a meal instead of getting your own. If you get your own meal, eat only half of it. Before you start eating, ask for a container and put half of your meal into it. When available, consider ordering smaller portions from the menu instead of full portions. Pay attention to your food and drink choices. Knowing the way food is cooked and what is included with the meal can help you eat fewer calories. If calories are listed on the menu, choose the lower-calorie options. Choose dishes that include vegetables, fruits, whole grains, low-fat dairy products, and lean proteins. Choose items that are boiled, broiled, grilled, or steamed. Avoid items that are buttered, battered, fried, or served with cream sauce. Items labeled as crispy are usually fried, unless stated otherwise. Choose water, low-fat milk,  unsweetened iced tea, or other drinks without added sugar. If you want an alcoholic beverage, choose a lower-calorie option, such as a glass of wine or light beer. Ask for dressings, sauces, and syrups on the side. These are usually high in calories, so you should limit the amount you eat. If you want a salad, choose a garden salad and ask for grilled meats. Avoid extra toppings such as bacon, cheese, or fried items. Ask for the dressing on the side, or ask for olive oil and vinegar or lemon to use as dressing. Estimate how many servings of a food you are given. Knowing serving sizes will help you be aware of how much food you are eating at restaurants. Where to find more information Centers for Disease Control and Prevention: FootballExhibition.com.br U.[place]. Department of Agriculture: WrestlingReporter.dk Summary Calorie counting means keeping track of how many calories you eat and drink each day. If you eat fewer calories than your body needs, you should lose weight. A healthy amount of weight to lose per week is usually 1-2 lb (0.5-0.9 kg). This usually means reducing your daily calorie intake by 500-750 calories. The number of calories in a food can be found on a Nutrition Facts label. If a food does not have a Nutrition Facts label, try to look up the calories online or ask your dietitian for help. Use smaller plates, glasses, and bowls for smaller portions and to prevent overeating. Use your calories on foods and drinks that will fill you up and not leave you hungry shortly after a meal. This information is not intended to replace advice given to you by your health care provider. Make sure you discuss any questions you have with your health care provider. Document Revised: 10/24/2019 Document Reviewed: 10/24/2019 Elsevier Patient Education  2023 [employer].

## 2023-07-10 NOTE — Progress Notes (Signed)
Established Patient Office Visit  Subjective   Patient ID: Daniel Acevedo, male    DOB: 24-Dec-1975  Age: 47 y.o. MRN: [id number]  Chief Complaint  Patient presents with   Hypertension   Hyperlipidemia   Hypothyroidism   Follow-up    HPI Discussed the use of AI scribe software for clinical note transcription with the patient, who gave verbal consent to proceed.  History of Present Illness   The patient, with a history of obesity, hypertension, and depression, presents for a medication review and to discuss his weight loss progress. He has been taking Zipbound 12.5mg , which has resulted in significant weight loss, from 315lbs in April to 281lbs currently. However, he reports intermittent nausea, usually lasting a day, after taking the medication. Despite this, he wishes to continue the current dose due to the positive effect on his weight.  His hypertension is managed with amlodipine, and he reports no current issues with his breathing. He continues to smoke, with no change in habit. His depression is managed with Lexapro, and he reports no current issues with mood.  The patient also has a history of an aneurysm at the top of the heart, for which he is under the care of a vascular specialist. He expresses frustration at seeing a different provider at each visit, feeling like he is 'starting over' each time.      Patient Active Problem List   Diagnosis Date Noted   Low back pain 01/20/2022   Morbid obesity (HCC) 01/20/2022   Thoracic aortic aneurysm without rupture (HCC) 06/11/2021   Sleep deprivation 12/01/2019   Hypersomnia with sleep apnea 12/01/2019   Preventative health care 07/02/2017   HTN (hypertension) 10/09/2015   Bronchitis 12/04/2013   Otitis media 12/04/2013   Severe obesity (BMI >= 40) (HCC) 08/01/2013   Anxiety 11/01/2012   DEPRESSIVE DISORDER 04/27/2010   Hypothyroidism (acquired) 03/15/2010   Hyperlipidemia 01/26/2010   THROMBOCYTOPENIA 01/12/2010   TOBACCO USE  01/04/2010   CARPAL TUNNEL SYNDROME, BILATERAL 01/04/2010   SNORING 01/04/2010   COUGH 01/04/2010   Past Medical History:  Diagnosis Date   CARPAL TUNNEL SYNDROME, BILATERAL 01/04/2010   DEPRESSIVE DISORDER 04/27/2010   HYPERLIPIDEMIA 01/26/2010   Hypertension    HYPOTHYROIDISM 03/15/2010   THROMBOCYTOPENIA 01/12/2010   Past Surgical History:  Procedure Laterality Date   BONE MARROW BIOPSY     Social History   Tobacco Use   Smoking status: Smoker, Current Status Unknown    Current packs/day: 2.00    Average packs/day: 2.0 packs/day for 23.0 years (46.0 ttl pk-yrs)    Types: Cigarettes   Smokeless tobacco: Never   Tobacco comments:    pt has tried many otc--- and wellbutrin and chantix with no success  Vaping Use   Vaping status: Never Used  Substance Use Topics   Alcohol use: Yes    Alcohol/week: 0.0 standard drinks of alcohol    Comment: occasional   Drug use: No   Social History   Socioeconomic History   Marital status: Married    Spouse name: Not on file   Number of children: Not on file   Years of education: Not on file   Highest education level: Not on file  Occupational History   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  Tobacco Use   Smoking status: Smoker, Current Status Unknown    Current packs/day: 2.00    Average packs/day: 2.0 packs/day for 23.0 years (46.0 ttl pk-yrs)  Types: Cigarettes   Smokeless tobacco: Never   Tobacco comments:    pt has tried many otc--- and wellbutrin and chantix with no success  Vaping Use   Vaping status: Never Used  Substance and Sexual Activity   Alcohol use: Yes    Alcohol/week: 0.0 standard drinks of alcohol    Comment: occasional   Drug use: No   Sexual activity: Yes    Partners: Female  Other Topics Concern   Not on file  Social History Narrative   Regular exercise-no      Social Determinants of Health   Financial Resource Strain: Not on file  Food Insecurity: Not on file   Transportation Needs: Not on file  Physical Activity: Not on file  Stress: Not on file  Social Connections: Not on file  Intimate Partner Violence: Not on file   Family Status  Relation Name Status   Mother  Alive   Father  Deceased at age 52   Brother  Alive   Brother  Deceased at age 23  No partnership data on file   Family History  Problem Relation Age of Onset   Thyroid disease Mother        hypothyroidism   Hypertension Mother    Heart disease Father        cabg   Diabetes Father    Kidney disease Father    Hypertension Father    Hypertension Brother    Heart disease Brother 37       MI   Diabetes Brother    Hypertension Brother    Hyperlipidemia Brother    No Known Allergies    Review of Systems  Constitutional:  Negative for fever and malaise/fatigue.  HENT:  Negative for congestion.   Eyes:  Negative for blurred vision.  Respiratory:  Negative for shortness of breath.   Cardiovascular:  Negative for chest pain, palpitations and leg swelling.  Gastrointestinal:  Negative for abdominal pain, blood in stool and nausea.  Genitourinary:  Negative for dysuria and frequency.  Musculoskeletal:  Negative for falls.  Skin:  Negative for rash.  Neurological:  Negative for dizziness, loss of consciousness and headaches.  Endo/Heme/Allergies:  Negative for environmental allergies.  Psychiatric/Behavioral:  Negative for depression. The patient is not nervous/anxious.       Objective:     BP (!) 118/98 (BP Location: Left Arm, Patient Position: Sitting, Cuff Size: Large)   Pulse (!) 112   Temp 98.6 F (37 C) (Oral)   Resp 18   Ht 5\' 10"  (1.778 m)   Wt 281 lb (127.5 kg)   SpO2 97%   BMI 40.32 kg/m  BP Readings from Last 3 Encounters:  07/10/23 (!) 118/98  01/20/23 98/80  10/04/22 (!) 133/93   Wt Readings from Last 3 Encounters:  07/10/23 281 lb (127.5 kg)  01/20/23 (!) 315 lb 6.4 oz (143.1 kg)  10/04/22 296 lb (134.3 kg)   SpO2 Readings from Last 3  Encounters:  07/10/23 97%  01/20/23 97%  10/04/22 96%      Physical Exam Vitals and nursing note reviewed.  Constitutional:      General: He is not in acute distress.    Appearance: Normal appearance. He is well-developed.  HENT:     Head: Normocephalic and atraumatic.  Eyes:     General: No scleral icterus.       Right eye: No discharge.        Left eye: No discharge.  Cardiovascular:  Rate and Rhythm: Normal rate and regular rhythm.     Heart sounds: No murmur heard. Pulmonary:     Effort: Pulmonary effort is normal. No respiratory distress.     Breath sounds: Normal breath sounds.  Musculoskeletal:        General: Normal range of motion.     Cervical back: Normal range of motion and neck supple.     Right lower leg: No edema.     Left lower leg: No edema.  Skin:    General: Skin is warm and dry.  Neurological:     Mental Status: He is alert and oriented to person, place, and time.  Psychiatric:        Mood and Affect: Mood normal.        Behavior: Behavior normal.        Thought Content: Thought content normal.        Judgment: Judgment normal.      No results found for any visits on 07/10/23.  Last CBC Lab Results  Component Value Date   WBC 8.8 01/20/2023   HGB 14.4 01/20/2023   HCT 40.8 01/20/2023   MCV 86.0 01/20/2023   MCH 28.8 04/06/2018   RDW 13.9 01/20/2023   PLT 120.0 (L) 01/20/2023   Last metabolic panel Lab Results  Component Value Date   GLUCOSE 95 01/20/2023   NA 140 01/20/2023   K 4.3 01/20/2023   CL 105 01/20/2023   CO2 26 01/20/2023   BUN 20 01/20/2023   CREATININE 1.20 01/20/2023   GFR 73.57 01/20/2023   CALCIUM 9.9 01/20/2023   PROT 7.0 01/20/2023   ALBUMIN 4.6 01/20/2023   BILITOT 0.6 01/20/2023   ALKPHOS 73 01/20/2023   AST 22 01/20/2023   ALT 22 01/20/2023   Last lipids Lab Results  Component Value Date   CHOL 104 01/20/2023   HDL 40.10 01/20/2023   LDLCALC 47 01/20/2023   LDLDIRECT 74.0 01/20/2022   TRIG 84.0  01/20/2023   CHOLHDL 3 01/20/2023   Last hemoglobin A1c Lab Results  Component Value Date   HGBA1C 5.6 01/20/2023   Last thyroid functions Lab Results  Component Value Date   TSH 46.31 (H) 01/20/2023   Last vitamin D Lab Results  Component Value Date   VD25OH 9.82 (L) 01/20/2023   Last vitamin B12 and Folate Lab Results  Component Value Date   VITAMINB12 908 08/01/2013      The ASCVD Risk score (Arnett DK, et al., 2019) failed to calculate for the following reasons:   The valid total cholesterol range is 130 to 320 mg/dL    Assessment & Plan:   Problem List Items Addressed This Visit       Unprioritized   HTN (hypertension)   Relevant Orders   CBC with Differential/Platelet   Comprehensive metabolic panel   Lipid panel   Hyperlipidemia   Relevant Orders   Comprehensive metabolic panel   Lipid panel   Morbid obesity (HCC) - Primary   Relevant Medications   tirzepatide (ZEPBOUND) 12.5 MG/0.5ML Pen   Other Visit Diagnoses     Hypothyroidism, unspecified type       Relevant Orders   TSH     Assessment and Plan    Obesity Significant weight loss since April (from 315 to 281 lbs) with Zipbound 12.5mg . Reports occasional nausea but overall tolerating well. -Continue Zipbound 12.5mg  daily. -Encourage continued dietary changes focusing on protein, vegetables, and healthy fats.  Hypertension Elevated diastolic blood pressure (98) today. Patient  is currently on Amlodipine. -Continue Amlodipine. -Encourage smoking cessation for better blood pressure control. -Check thyroid due to high pulse rate today.  Aortic Aneurysm Follow-up with vascular specialist scheduled for November. Patient reports seeing different providers at each visit. -Encourage patient to discuss concerns with specialist's office.  Smoking No change in smoking status. -Continue to encourage smoking cessation.  General Health Maintenance -Declined flu shot today.        Return in  about 6 months (around 01/08/2024), or if symptoms worsen or fail to improve.    Donato Schultz, DO

## 2023-07-30 ENCOUNTER — Other Ambulatory Visit: Payer: Self-pay | Admitting: Family Medicine

## 2023-07-30 DIAGNOSIS — E785 Hyperlipidemia, unspecified: Secondary | ICD-10-CM

## 2023-07-30 DIAGNOSIS — I10 Essential (primary) hypertension: Secondary | ICD-10-CM

## 2023-07-30 DIAGNOSIS — R1013 Epigastric pain: Secondary | ICD-10-CM

## 2023-08-15 ENCOUNTER — Other Ambulatory Visit (HOSPITAL_COMMUNITY): Payer: Self-pay

## 2023-08-30 ENCOUNTER — Other Ambulatory Visit: Payer: Self-pay | Admitting: Family Medicine

## 2023-08-30 DIAGNOSIS — I10 Essential (primary) hypertension: Secondary | ICD-10-CM

## 2023-09-01 ENCOUNTER — Other Ambulatory Visit: Payer: Self-pay

## 2023-09-04 ENCOUNTER — Other Ambulatory Visit: Payer: Self-pay | Admitting: Thoracic Surgery (Cardiothoracic Vascular Surgery)

## 2023-09-04 DIAGNOSIS — I7121 Aneurysm of the ascending aorta, without rupture: Secondary | ICD-10-CM

## 2023-10-03 ENCOUNTER — Ambulatory Visit
Admission: RE | Admit: 2023-10-03 | Discharge: 2023-10-03 | Disposition: A | Discharge status: Home / Self Care | Payer: BC Managed Care – PPO | Source: Ambulatory Visit | Attending: Thoracic Surgery (Cardiothoracic Vascular Surgery) | Admitting: Thoracic Surgery (Cardiothoracic Vascular Surgery)

## 2023-10-03 DIAGNOSIS — I7121 Aneurysm of the ascending aorta, without rupture: Secondary | ICD-10-CM | POA: Diagnosis not present

## 2023-10-03 MED ORDER — IOPAMIDOL (ISOVUE-370) INJECTION 76%
500.0000 mL | Freq: Once | INTRAVENOUS | Status: AC | PRN
  Administered 2023-10-03: 75 mL via INTRAVENOUS

## 2023-10-10 ENCOUNTER — Ambulatory Visit: Payer: BC Managed Care – PPO | Admitting: Thoracic Surgery (Cardiothoracic Vascular Surgery)

## 2023-10-10 ENCOUNTER — Encounter: Payer: Self-pay | Admitting: Thoracic Surgery (Cardiothoracic Vascular Surgery)

## 2023-10-10 VITALS — BP 128/90 | HR 82 | Resp 20 | Ht 70.0 in | Wt 292.0 lb

## 2023-10-10 DIAGNOSIS — I7121 Aneurysm of the ascending aorta, without rupture: Secondary | ICD-10-CM

## 2023-10-10 NOTE — Progress Notes (Signed)
 [address]       [place] [address]             [phone]     HPI: [name] returns for a scheduled follow-up regarding his ascending aneurysm.  Kristin Lamagna is a 48 year old man with a history of tobacco use, hypertension, hyperlipidemia, reflux, hypothyroidism, sleep apnea and an ascending aortic aneurysm.  His aneurysm was first noted in 2021 after a syncopal episode.  I last saw him in the office in January 2024.  Aneurysm measured 4.2 cm.  He feels well.  He denies any chest pain, pressure, tightness, or shortness of breath.  Denies peripheral edema.  Does not check his blood pressure at home and just relies on checks when he goes to physician visits.  Past Medical History:  Diagnosis Date   CARPAL TUNNEL SYNDROME, BILATERAL 01/04/2010   DEPRESSIVE DISORDER 04/27/2010   HYPERLIPIDEMIA 01/26/2010   Hypertension    HYPOTHYROIDISM 03/15/2010   Thoracic aortic aneurysm without rupture (HCC) 06/11/2021   THROMBOCYTOPENIA 01/12/2010    Current Outpatient Medications  Medication Sig Dispense Refill   amLODipine  (NORVASC ) 5 MG tablet TAKE 1 TABLET (5 MG TOTAL) BY MOUTH DAILY. 30 tablet 2   escitalopram  (LEXAPRO ) 20 MG tablet Take 1 tablet (20 mg total) by mouth daily. 90 tablet 3   ezetimibe  (ZETIA ) 10 MG tablet TAKE 1 TABLET BY MOUTH EVERY DAY 30 tablet 2   fenofibrate  160 MG tablet TAKE 1 TABLET BY MOUTH EVERY DAY 90 tablet 0   fluticasone  (FLONASE ) 50 MCG/ACT nasal spray Place 2 sprays into both nostrils daily. 16 g 6   levothyroxine  (SYNTHROID ) 200 MCG tablet TAKE 1 TABLET BY MOUTH EVERY DAY 30 tablet 2   lisinopril  (ZESTRIL ) 40 MG tablet TAKE 1 TABLET BY MOUTH EVERY DAY 30 tablet 5   pantoprazole  (PROTONIX ) 40 MG tablet TAKE 1 TABLET BY MOUTH EVERY DAY 30 tablet 2   rosuvastatin  (CRESTOR ) 40 MG tablet TAKE 1 TABLET BY MOUTH EVERYDAY AT BEDTIME 30 tablet 2   tirzepatide  (ZEPBOUND ) 12.5 MG/0.5ML Pen Inject 12.5 mg into the skin once a week. 2 mL 5    No current facility-administered medications for this visit.    Physical Exam BP (!) 128/90 (BP Location: Right Arm)   Pulse 82   Resp 20   Ht 5' 10 (1.778 m)   Wt 292 lb (132.5 kg)   SpO2 94% Comment: RA  BMI 41.90 kg/m  Obese 48 year old man in no acute distress Alert and oriented x 3 with no focal deficits Lungs clear bilaterally Cardiac regular rate and rhythm with a normal S1 and S2, no murmur No peripheral edema No carotid bruits  Diagnostic Tests: CT ANGIOGRAPHY CHEST WITH CONTRAST   TECHNIQUE: Multidetector CT imaging of the chest was performed using the standard protocol during bolus administration of intravenous contrast. Multiplanar CT image reconstructions and MIPs were obtained to evaluate the vascular anatomy.   RADIATION DOSE REDUCTION: This exam was performed according to the departmental dose-optimization program which includes automated exposure control, adjustment of the mA and/or kV according to patient size and/or use of iterative reconstruction technique.   CONTRAST:  75mL ISOVUE -370 IOPAMIDOL  (ISOVUE -370) INJECTION 76%   COMPARISON:  Chest CT dated 09/29/2022.   FINDINGS: Cardiovascular: There is no cardiomegaly. Trace pericardial effusion anterior to the heart. Stable focus of calcification at the inferior cavoatrial junction. Similar appearance of ascending aorta measuring 4.2 cm in diameter. No aortic dissection. The origins of the  great vessels of the aortic arch appear patent. No pulmonary artery embolus identified.   Mediastinum/Nodes: No hilar or mediastinal adenopathy. Small hiatal hernia. The esophagus is grossly unremarkable. No mediastinal fluid collection.   Lungs/Pleura: No focal consolidation, pleural effusion, or pneumothorax. The central airways are patent.   Upper Abdomen: No acute abnormality.   Musculoskeletal: No acute osseous pathology.   Review of the MIP images confirms the above findings.   IMPRESSION: 1.  No acute intrathoracic pathology. 2. Stable ascending aortic aneurysm measuring 4.2 cm in diameter. Recommend annual imaging follow-up by CTA or MRA.     Electronically Signed   By: Vanetta Chou M.D.   On: 10/03/2023 15:02 I personally reviewed the CT images.  4.2 cm ascending aneurysm.  Impression: Daniel Acevedo is a 48 year old man with a history of tobacco use, hypertension, hyperlipidemia, reflux, hypothyroidism, sleep apnea and an ascending aortic aneurysm.    Ascending aneurysm-stable 4.2 cm.  Needs continued annual follow-up.  Importance of blood pressure control and smoking cessation emphasized.  Hypertension-systolic blood pressure better controlled than at his previous visit.  Diastolic a little higher than ideal but pulse pressure is reasonable.  Tobacco use-I again emphasized the importance of tobacco cessation for his overall health and longevity.  Plan: Return in 1 year with CT angiogram of chest to follow-up ascending aneurysm  Elspeth JAYSON Millers, MD [facility] [phone]

## 2023-10-29 ENCOUNTER — Other Ambulatory Visit: Payer: Self-pay | Admitting: Family Medicine

## 2023-10-29 DIAGNOSIS — R1013 Epigastric pain: Secondary | ICD-10-CM

## 2023-10-29 DIAGNOSIS — I10 Essential (primary) hypertension: Secondary | ICD-10-CM

## 2023-10-29 DIAGNOSIS — E785 Hyperlipidemia, unspecified: Secondary | ICD-10-CM

## 2023-11-13 ENCOUNTER — Other Ambulatory Visit (HOSPITAL_COMMUNITY): Payer: Self-pay

## 2023-11-15 ENCOUNTER — Other Ambulatory Visit: Payer: Self-pay

## 2023-11-15 ENCOUNTER — Other Ambulatory Visit (HOSPITAL_COMMUNITY): Payer: Self-pay

## 2023-12-04 ENCOUNTER — Encounter: Payer: Self-pay | Admitting: Family Medicine

## 2023-12-04 ENCOUNTER — Ambulatory Visit: Admitting: Family Medicine

## 2023-12-04 VITALS — BP 112/80 | HR 73 | Temp 97.7°F | Resp 18 | Ht 70.0 in | Wt 300.6 lb

## 2023-12-04 DIAGNOSIS — R6889 Other general symptoms and signs: Secondary | ICD-10-CM | POA: Diagnosis not present

## 2023-12-04 LAB — POC INFLUENZA A&B (BINAX/QUICKVUE)
Influenza A, POC: NEGATIVE
Influenza B, POC: NEGATIVE

## 2023-12-04 LAB — POC COVID19 BINAXNOW: SARS Coronavirus 2 Ag: NEGATIVE

## 2023-12-04 MED ORDER — OSELTAMIVIR PHOSPHATE 75 MG PO CAPS: 75.0000 mg | ORAL_CAPSULE | Freq: Two times a day (BID) | ORAL | 0 refills | Status: DC

## 2023-12-04 NOTE — Progress Notes (Signed)
 Established Patient Office Visit  Subjective   Patient ID: Daniel Acevedo, male    DOB: 01/14/1976  Age: 48 y.o. MRN: [id number]  Chief Complaint  Patient presents with   Cough    Sxs started Saturday, pt states having flu like sxs and son had flu last week, reports fever last night 100.1.     HPI   Discussed the use of AI scribe software for clinical note transcription with the patient, who gave verbal consent to proceed.  History of Present Illness     Discussed the use of AI scribe software for clinical note transcription with the patient, who gave verbal consent to proceed.  History of Present Illness   He presents with flu-like symptoms.  He has been experiencing flu-like symptoms for three days, including a burning sensation, body aches, chills, and a fever peaking at 100.35F. He also has a sore throat, nasal congestion, and a cough. His son had the flu last week, and several colleagues at work have been ill with similar symptoms.  He has been taking ibuprofen to manage his symptoms. No use of Flonase or any antihistamines. 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 He mentions that he can access the note from his phone if needed.           Patient Active Problem List   Diagnosis Date Noted   Low back pain 01/20/2022   Morbid obesity (HCC) 01/20/2022   Thoracic aortic aneurysm without rupture (HCC) 06/11/2021   Sleep deprivation 12/01/2019   Hypersomnia with sleep apnea 12/01/2019   Preventative health care 07/02/2017   HTN (hypertension) 10/09/2015   Bronchitis 12/04/2013   Otitis media 12/04/2013   Severe obesity (BMI >= 40) (HCC) 08/01/2013   Anxiety 11/01/2012   DEPRESSIVE DISORDER 04/27/2010   Hypothyroidism (acquired) 03/15/2010   Hyperlipidemia 01/26/2010   THROMBOCYTOPENIA 01/12/2010   TOBACCO USE 01/04/2010   CARPAL TUNNEL SYNDROME, BILATERAL  01/04/2010   SNORING 01/04/2010   COUGH 01/04/2010   Past Medical History:  Diagnosis Date   CARPAL TUNNEL SYNDROME, BILATERAL 01/04/2010   DEPRESSIVE DISORDER 04/27/2010   HYPERLIPIDEMIA 01/26/2010   Hypertension    HYPOTHYROIDISM 03/15/2010   Thoracic aortic aneurysm without rupture (HCC) 06/11/2021   THROMBOCYTOPENIA 01/12/2010   Past Surgical History:  Procedure Laterality Date   BONE MARROW BIOPSY     Social History   Tobacco Use   Smoking status: Smoker, Current Status Unknown    Current packs/day: 2.00    Average packs/day: 2.0 packs/day for 23.0 years (46.0 ttl pk-yrs)    Types: Cigarettes   Smokeless tobacco: Never   Tobacco comments:    pt has tried many otc--- and wellbutrin and chantix with no success  Vaping Use   Vaping status: Never Used  Substance Use Topics   Alcohol use: Yes    Alcohol/week: 0.0 standard drinks of alcohol    Comment: occasional   Drug use: No   Social History   Socioeconomic History   Marital status: Married    Spouse name: Not on file   Number of children: Not on file   Years of education: Not on file   Highest education level: Not on file  Occupational History   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  Tobacco Use   Smoking status: Smoker, Current Status Unknown  Current packs/day: 2.00    Average packs/day: 2.0 packs/day for 23.0 years (46.0 ttl pk-yrs)    Types: Cigarettes   Smokeless tobacco: Never   Tobacco comments:    pt has tried many otc--- and wellbutrin and chantix with no success  Vaping Use   Vaping status: Never Used  Substance and Sexual Activity   Alcohol use: Yes    Alcohol/week: 0.0 standard drinks of alcohol    Comment: occasional   Drug use: No   Sexual activity: Yes    Partners: Female  Other Topics Concern   Not on file  Social History Narrative   Regular exercise-no      Social Drivers of Health   Financial Resource Strain: Not on file  Food Insecurity:  Not on file  Transportation Needs: Not on file  Physical Activity: Not on file  Stress: Not on file  Social Connections: Not on file  Intimate Partner Violence: Not on file   Family Status  Relation Name Status   Mother  Alive   Father  Deceased at age 24   Brother  Alive   Brother  Deceased at age 47  No partnership data on file   Family History  Problem Relation Age of Onset   Thyroid disease Mother        hypothyroidism   Hypertension Mother    Heart disease Father        cabg   Diabetes Father    Kidney disease Father    Hypertension Father    Hypertension Brother    Heart disease Brother 68       MI   Diabetes Brother    Hypertension Brother    Hyperlipidemia Brother    No Known Allergies    Review of Systems  Constitutional:  Positive for chills, fever and malaise/fatigue.  HENT:  Positive for congestion. Negative for ear pain.   Eyes:  Negative for blurred vision.  Respiratory:  Positive for cough. Negative for shortness of breath and wheezing.   Cardiovascular:  Negative for chest pain, palpitations and leg swelling.  Gastrointestinal:  Negative for vomiting.  Musculoskeletal:  Negative for back pain.  Skin:  Negative for rash.  Neurological:  Negative for loss of consciousness and headaches.      Objective:     BP 112/80 (BP Location: Left Arm, Patient Position: Sitting, Cuff Size: Large)   Pulse 73   Temp 97.7 F (36.5 C) (Oral)   Resp 18   Ht 5\' 10"  (1.778 m)   Wt (!) 300 lb 9.6 oz (136.4 kg)   SpO2 98%   BMI 43.13 kg/m  BP Readings from Last 3 Encounters:  12/04/23 112/80  10/10/23 (!) 128/90  07/10/23 (!) 118/98   Wt Readings from Last 3 Encounters:  12/04/23 (!) 300 lb 9.6 oz (136.4 kg)  10/10/23 292 lb (132.5 kg)  07/10/23 281 lb (127.5 kg)   SpO2 Readings from Last 3 Encounters:  12/04/23 98%  10/10/23 94%  07/10/23 97%    Physical Exam Vitals and nursing note reviewed.  Constitutional:      General: He is not in acute  distress.    Appearance: Normal appearance. He is well-developed.  HENT:     Head: Normocephalic and atraumatic.     Nose: Congestion present.     Mouth/Throat:     Pharynx: Posterior oropharyngeal erythema present.  Eyes:     General: No scleral icterus.       Right eye: No discharge.  Left eye: No discharge.  Cardiovascular:     Rate and Rhythm: Normal rate and regular rhythm.     Heart sounds: No murmur heard. Pulmonary:     Effort: Pulmonary effort is normal. No respiratory distress.     Breath sounds: Wheezing present.  Musculoskeletal:        General: Normal range of motion.     Cervical back: Normal range of motion and neck supple.     Right lower leg: No edema.     Left lower leg: No edema.  Lymphadenopathy:     Cervical: Cervical adenopathy present.  Skin:    General: Skin is warm and dry.  Neurological:     Mental Status: He is alert and oriented to person, place, and time.  Psychiatric:        Mood and Affect: Mood normal.        Behavior: Behavior normal.        Thought Content: Thought content normal.        Judgment: Judgment normal.     No results found for any visits on 12/04/23.  Last CBC Lab Results  Component Value Date   WBC 8.7 07/10/2023   HGB 16.6 07/10/2023   HCT 48.8 07/10/2023   MCV 85.4 07/10/2023   MCH 28.8 04/06/2018   RDW 14.0 07/10/2023   PLT 85.0 (L) 07/10/2023   Last metabolic panel Lab Results  Component Value Date   GLUCOSE 103 (H) 07/10/2023   NA 142 07/10/2023   K 4.4 07/10/2023   CL 106 07/10/2023   CO2 28 07/10/2023   BUN 15 07/10/2023   CREATININE 1.30 07/10/2023   GFR 66.61 07/10/2023   CALCIUM 10.1 07/10/2023   PROT 7.0 07/10/2023   ALBUMIN 4.6 07/10/2023   BILITOT 0.8 07/10/2023   ALKPHOS 87 07/10/2023   AST 24 07/10/2023   ALT 27 07/10/2023   Last lipids Lab Results  Component Value Date   CHOL 109 07/10/2023   HDL 37.10 (L) 07/10/2023   LDLCALC 46 07/10/2023   LDLDIRECT 74.0 01/20/2022   TRIG  127.0 07/10/2023   CHOLHDL 3 07/10/2023   Last hemoglobin A1c Lab Results  Component Value Date   HGBA1C 5.6 01/20/2023   Last thyroid functions Lab Results  Component Value Date   TSH 1.63 07/10/2023   Last vitamin D Lab Results  Component Value Date   VD25OH 9.82 (L) 01/20/2023   Last vitamin B12 and Folate Lab Results  Component Value Date   VITAMINB12 908 08/01/2013      The ASCVD Risk score (Arnett DK, et al., 2019) failed to calculate for the following reasons:   The valid total cholesterol range is 130 to 320 mg/dL    Assessment & Plan:   Problem List Items Addressed This Visit   None Visit Diagnoses       Flu-like symptoms    -  Primary   Relevant Medications   oseltamivir (TAMIFLU) 75 MG capsule   Other Relevant Orders   POC COVID-19   POC Influenza A&B (Binax test)     Assessment and Plan    Influenza Acute flu-like symptoms began 3 days ago, including fever of 100.26F, body aches, chills, nasal congestion, and a mild sore throat. Despite negative rapid flu and COVID tests, there is a high suspicion of influenza due to recent exposure from his son and at work. A negative test does not rule out flu. Emphasized the importance of rest, hydration, and avoiding work until [employer] for  24 hours without antipyretics. Prescribe Tamiflu. Recommend rest and increased fluid intake. Advise Tylenol or ibuprofen for fever and body aches. Provide a work note for absence. Advise contacting the clinic if symptoms worsen or do not improve.  Insomnia Chronic difficulty sleeping.  General Health Maintenance Discussed dietary modifications for weight management, including a high protein, low carbohydrate diet (Atkins), intermittent fasting, and using MyFitnessPal or Weight Watchers apps. Emphasized reducing simple sugars and increasing protein intake. Recommend a high protein, low carbohydrate diet (Atkins). Suggest intermittent fasting. Advise using MyFitnessPal or Weight  Watchers apps for dietary tracking.  Follow-up Follow-up if symptoms worsen or do not improve. Return to work on Thursday if fever-free for 24 hours without antipyretics. Extend work absence if fever persists.        Return if symptoms worsen or fail to improve.    Donato Schultz, DO

## 2023-12-04 NOTE — Patient Instructions (Signed)

## 2024-02-07 ENCOUNTER — Other Ambulatory Visit: Payer: Self-pay | Admitting: Family Medicine

## 2024-02-07 DIAGNOSIS — E785 Hyperlipidemia, unspecified: Secondary | ICD-10-CM

## 2024-02-07 DIAGNOSIS — R1013 Epigastric pain: Secondary | ICD-10-CM

## 2024-02-07 DIAGNOSIS — I10 Essential (primary) hypertension: Secondary | ICD-10-CM

## 2024-03-17 ENCOUNTER — Other Ambulatory Visit: Payer: Self-pay | Admitting: Family Medicine

## 2024-03-17 DIAGNOSIS — R1013 Epigastric pain: Secondary | ICD-10-CM

## 2024-03-17 DIAGNOSIS — I10 Essential (primary) hypertension: Secondary | ICD-10-CM

## 2024-03-17 DIAGNOSIS — E785 Hyperlipidemia, unspecified: Secondary | ICD-10-CM

## 2024-04-29 ENCOUNTER — Other Ambulatory Visit: Payer: Self-pay | Admitting: Family Medicine

## 2024-04-29 DIAGNOSIS — R6889 Other general symptoms and signs: Secondary | ICD-10-CM

## 2024-05-25 ENCOUNTER — Other Ambulatory Visit: Payer: Self-pay | Admitting: Family Medicine

## 2024-05-25 DIAGNOSIS — E785 Hyperlipidemia, unspecified: Secondary | ICD-10-CM

## 2024-05-25 DIAGNOSIS — I10 Essential (primary) hypertension: Secondary | ICD-10-CM

## 2024-05-25 DIAGNOSIS — R1013 Epigastric pain: Secondary | ICD-10-CM

## 2024-09-04 ENCOUNTER — Other Ambulatory Visit: Payer: Self-pay | Admitting: Family Medicine

## 2024-09-04 DIAGNOSIS — R1013 Epigastric pain: Secondary | ICD-10-CM

## 2024-09-04 DIAGNOSIS — E785 Hyperlipidemia, unspecified: Secondary | ICD-10-CM

## 2024-09-04 DIAGNOSIS — I10 Essential (primary) hypertension: Secondary | ICD-10-CM

## 2024-09-09 ENCOUNTER — Other Ambulatory Visit: Payer: Self-pay | Admitting: Thoracic Surgery (Cardiothoracic Vascular Surgery)

## 2024-09-09 DIAGNOSIS — I7121 Aneurysm of the ascending aorta, without rupture: Secondary | ICD-10-CM

## 2024-09-25 ENCOUNTER — Other Ambulatory Visit: Payer: Self-pay | Admitting: Family Medicine

## 2024-10-01 ENCOUNTER — Encounter: Payer: Self-pay | Admitting: Family Medicine

## 2024-10-02 ENCOUNTER — Other Ambulatory Visit: Payer: Self-pay

## 2024-10-02 NOTE — Telephone Encounter (Signed)
 I don't believe the pills are available yet? I couldn't find them in the system

## 2024-10-07 ENCOUNTER — Other Ambulatory Visit: Payer: Self-pay | Admitting: Family Medicine

## 2024-10-07 ENCOUNTER — Inpatient Hospital Stay
Admission: RE | Admit: 2024-10-07 | Discharge: 2024-10-07 | Disposition: A | Source: Ambulatory Visit | Attending: Thoracic Surgery (Cardiothoracic Vascular Surgery) | Admitting: Thoracic Surgery (Cardiothoracic Vascular Surgery)

## 2024-10-07 DIAGNOSIS — I7121 Aneurysm of the ascending aorta, without rupture: Secondary | ICD-10-CM

## 2024-10-07 MED ORDER — IOPAMIDOL (ISOVUE-370) INJECTION 76%
75.0000 mL | Freq: Once | INTRAVENOUS | Status: AC | PRN
  Administered 2024-10-07: 75 mL via INTRAVENOUS

## 2024-10-07 MED ORDER — NONFORMULARY OR COMPOUNDED ITEM: 0 refills | Status: DC

## 2024-10-09 NOTE — Telephone Encounter (Signed)
 The pills are now in the system. I didn't see a 2.5mg  in tablet form. Available doses are 1.5mg , 4mg , 9mg , and 25mg . Please advise

## 2024-10-09 NOTE — Addendum Note (Signed)
 Addended by: ELOUISE POWELL HERO on: 10/09/2024 03:06 PM   Modules accepted: Orders

## 2024-10-10 ENCOUNTER — Other Ambulatory Visit: Payer: Self-pay | Admitting: Family Medicine

## 2024-10-10 ENCOUNTER — Telehealth: Payer: Self-pay

## 2024-10-10 MED ORDER — WEGOVY 1.5 MG PO TABS: 1.5000 mg | ORAL_TABLET | Freq: Every day | ORAL | 0 refills | Status: AC

## 2024-10-10 NOTE — Telephone Encounter (Signed)
 Last OV 11/2023- will need an updated in person visit for wt check for PA to change/start Wegovy  tablets

## 2024-10-10 NOTE — Telephone Encounter (Signed)
 Got him scheduled

## 2024-10-11 NOTE — Telephone Encounter (Signed)
 Copied from CRM [id number]. Topic: Clinical - Prescription Issue >> Oct 11, 2024  9:18 AM Viola FALCON wrote: Isaiah from [facility] called regarding script that was sent yesterday semaglutide -weight management (WEGOVY ) 1.5 MG tablet - there is no 2.5mg  and needs new script of the 1.5MG  to be sent

## 2024-10-11 NOTE — Telephone Encounter (Signed)
 1.5mg  tablet already sent. We are waiting for Pt to come in for visit for wt check to complete PA.

## 2024-10-12 ENCOUNTER — Other Ambulatory Visit: Payer: Self-pay | Admitting: Family Medicine

## 2024-10-12 DIAGNOSIS — I10 Essential (primary) hypertension: Secondary | ICD-10-CM

## 2024-10-12 DIAGNOSIS — R1013 Epigastric pain: Secondary | ICD-10-CM

## 2024-10-12 DIAGNOSIS — E785 Hyperlipidemia, unspecified: Secondary | ICD-10-CM

## 2024-10-15 ENCOUNTER — Ambulatory Visit

## 2024-10-15 ENCOUNTER — Encounter: Payer: Self-pay | Admitting: Family Medicine

## 2024-10-15 ENCOUNTER — Ambulatory Visit: Admitting: Family Medicine

## 2024-10-15 VITALS — BP 122/76 | HR 61 | Resp 20 | Ht 70.0 in | Wt 324.2 lb

## 2024-10-15 VITALS — BP 120/90 | HR 72 | Temp 98.2°F | Resp 18 | Ht 70.0 in | Wt 322.6 lb

## 2024-10-15 DIAGNOSIS — Z6841 Body Mass Index (BMI) 40.0 and over, adult: Secondary | ICD-10-CM

## 2024-10-15 DIAGNOSIS — I7121 Aneurysm of the ascending aorta, without rupture: Secondary | ICD-10-CM | POA: Diagnosis not present

## 2024-10-15 DIAGNOSIS — Z1211 Encounter for screening for malignant neoplasm of colon: Secondary | ICD-10-CM

## 2024-10-15 LAB — CBC WITH DIFFERENTIAL/PLATELET
Basophils Absolute: 0 K/uL (ref 0.0–0.1)
Basophils Relative: 0.3 % (ref 0.0–3.0)
Eosinophils Absolute: 0.2 K/uL (ref 0.0–0.7)
Eosinophils Relative: 3.9 % (ref 0.0–5.0)
HCT: 41.3 % (ref 39.0–52.0)
Hemoglobin: 14.3 g/dL (ref 13.0–17.0)
Lymphocytes Relative: 19.7 % (ref 12.0–46.0)
Lymphs Abs: 1.2 K/uL (ref 0.7–4.0)
MCHC: 34.7 g/dL (ref 30.0–36.0)
MCV: 86.1 fl (ref 78.0–100.0)
Monocytes Absolute: 0.4 K/uL (ref 0.1–1.0)
Monocytes Relative: 6.7 % (ref 3.0–12.0)
Neutro Abs: 4.1 K/uL (ref 1.4–7.7)
Neutrophils Relative %: 69.4 % (ref 43.0–77.0)
Platelets: 55 K/uL — ABNORMAL LOW (ref 150.0–400.0)
RBC: 4.8 Mil/uL (ref 4.22–5.81)
RDW: 14.4 % (ref 11.5–15.5)
WBC: 6 K/uL (ref 4.0–10.5)

## 2024-10-15 LAB — LIPID PANEL
Cholesterol: 109 mg/dL (ref 28–200)
HDL: 41.3 mg/dL
LDL Cholesterol: 50 mg/dL (ref 10–99)
NonHDL: 67.68
Total CHOL/HDL Ratio: 3
Triglycerides: 88 mg/dL (ref 10.0–149.0)
VLDL: 17.6 mg/dL (ref 0.0–40.0)

## 2024-10-15 LAB — COMPREHENSIVE METABOLIC PANEL WITH GFR
ALT: 24 U/L (ref 3–53)
AST: 24 U/L (ref 5–37)
Albumin: 4.6 g/dL (ref 3.5–5.2)
Alkaline Phosphatase: 64 U/L (ref 39–117)
BUN: 15 mg/dL (ref 6–23)
CO2: 26 meq/L (ref 19–32)
Calcium: 8.9 mg/dL (ref 8.4–10.5)
Chloride: 110 meq/L (ref 96–112)
Creatinine, Ser: 0.88 mg/dL (ref 0.40–1.50)
GFR: 103.34 mL/min
Glucose, Bld: 107 mg/dL — ABNORMAL HIGH (ref 70–99)
Potassium: 4.2 meq/L (ref 3.5–5.1)
Sodium: 141 meq/L (ref 135–145)
Total Bilirubin: 0.5 mg/dL (ref 0.2–1.2)
Total Protein: 7 g/dL (ref 6.0–8.3)

## 2024-10-15 LAB — VITAMIN B12: Vitamin B-12: 1017 pg/mL — ABNORMAL HIGH (ref 211–911)

## 2024-10-15 LAB — HEMOGLOBIN A1C: Hgb A1c MFr Bld: 5.4 % (ref 4.6–6.5)

## 2024-10-15 LAB — TSH: TSH: 9.47 u[IU]/mL — ABNORMAL HIGH (ref 0.35–5.50)

## 2024-10-15 LAB — VITAMIN D 25 HYDROXY (VIT D DEFICIENCY, FRACTURES): VITD: 11.3 ng/mL — ABNORMAL LOW (ref 30.00–100.00)

## 2024-10-15 NOTE — Patient Instructions (Signed)
 Risk Modification in those with ascending thoracic aortic aneurysm:   Continue control of blood pressure (prefer BP 130/80 or less)   2. Avoid fluoroquinolone antibiotics (I.e Ciprofloxacin, Avelox, Levofloxacin, Ofloxacin)   3.  Use of statin (to decrease cardiovascular risk)   4.  Exercise and activity limitations is individualized, but in general, contact sports are to be avoided and one should avoid heavy lifting (defined as half of ideal body weight) and exercises involving sustained Valsalva maneuver.   5.  Follow-up in one year with CTA chest.  OK to use a non-contrast CT if you have had a recent study for surveillance of the lung nodule.

## 2024-10-15 NOTE — Progress Notes (Signed)
 "  Subjective:    Patient ID: Daniel Acevedo, male    DOB: 10-06-1975, 49 y.o.   MRN: [id number]  Chief Complaint  Patient presents with   Weight Check    HPI Patient is in today for weight check.   Discussed the use of AI scribe software for clinical note transcription with the patient, who gave verbal consent to proceed.  History of Present Illness Daniel Acevedo is a 49 year old male who presents for weight management and medication approval.  He is seeking to restart Wegovy  in tablet form after discontinuing it due to insurance issues. Since stopping the medication, he has gained 22 pounds since March and 32 pounds since January of the previous year. Despite reducing food intake and joining a gym, he has been unable to lose weight. He exercises three times a week, incorporating both cardio and weight training.  His eating habits are irregular, often skipping breakfast and lunch, and only consuming dinner.  He has a history of smoking and has attempted to quit using vaping devices but reports no desire to quit smoking entirely. He experiences wheezing and uses a CPAP machine for sleep apnea, although not regularly.  He has not yet undergone a colonoscopy, despite being of the recommended age for this procedure.                           Past Medical History:  Diagnosis Date   CARPAL TUNNEL SYNDROME, BILATERAL 01/04/2010   DEPRESSIVE DISORDER 04/27/2010   HYPERLIPIDEMIA 01/26/2010   Hypertension    HYPOTHYROIDISM 03/15/2010   Thoracic aortic aneurysm without rupture 06/11/2021   THROMBOCYTOPENIA 01/12/2010    Past Surgical History:  Procedure Laterality Date   BONE MARROW BIOPSY      Family History  Problem Relation Age of Onset   Thyroid  disease Mother        hypothyroidism   Hypertension Mother    Heart disease Father        cabg   Diabetes Father    Kidney disease Father    Hypertension Father    Hypertension Brother    Heart  disease Brother 26       MI   Diabetes Brother    Hypertension Brother    Hyperlipidemia Brother     Social History   Socioeconomic History   Marital status: Married    Spouse name: Not on file   Number of children: Not on file   Years of education: Not on file   Highest education level: Not on file  Occupational History   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  Tobacco Use   Smoking status: Smoker, Current Status Unknown    Current packs/day: 2.00    Average packs/day: 2.0 packs/day for 23.0 years (46.0 ttl pk-yrs)    Types: Cigarettes   Smokeless tobacco: Never   Tobacco comments:    pt has tried many otc--- and wellbutrin and chantix  with no success  Vaping Use   Vaping status: Never Used  Substance and Sexual Activity   Alcohol use: Yes    Alcohol/week: 0.0 standard drinks of alcohol    Comment: occasional   Drug use: No   Sexual activity: Yes    Partners: Female  Other Topics Concern   Not on file  Social History Narrative   Regular exercise-no      Social Drivers of Health   Tobacco Use:  High Risk (10/15/2024)   Patient History    Smoking Tobacco Use: Smoker, Current Status Unknown    Smokeless Tobacco Use: Never    Passive Exposure: Not on file  Financial Resource Strain: Not on file  Food Insecurity: Not on file  Transportation Needs: Not on file  Physical Activity: Not on file  Stress: Not on file  Social Connections: Not on file  Intimate Partner Violence: Not on file  Depression (PHQ2-9): High Risk (10/15/2024)   Depression (PHQ2-9)    PHQ-2 Score: 11  Alcohol Screen: Not on file  Housing: Not on file  Utilities: Not on file  Health Literacy: Not on file    Outpatient Medications Prior to Visit  Medication Sig Dispense Refill   amLODipine  (NORVASC ) 5 MG tablet TAKE 1 TABLET (5 MG TOTAL) BY MOUTH DAILY. PT NEEDS OFFICE VISIT FOR FURTHER REFILLS 30 tablet 0   escitalopram  (LEXAPRO ) 20 MG tablet Take 1 tablet (20  mg total) by mouth daily. 90 tablet 3   ezetimibe  (ZETIA ) 10 MG tablet TAKE 1 TABLET (10 MG TOTAL) BY MOUTH DAILY. PT NEEDS OFFICE VISIT FOR FURTHER REFILLS 30 tablet 0   fenofibrate  160 MG tablet TAKE 1 TABLET (160 MG TOTAL) BY MOUTH DAILY. PT NEEDS OFFICE VISIT FOR FURTHER REFILLS 30 tablet 0   levothyroxine  (SYNTHROID ) 200 MCG tablet Take 1 tablet (200 mcg total) by mouth daily before breakfast. Pt needs office visit for further refills 30 tablet 0   lisinopril  (ZESTRIL ) 40 MG tablet TAKE 1 TABLET BY MOUTH EVERY DAY 30 tablet 5   pantoprazole  (PROTONIX ) 40 MG tablet TAKE 1 TABLET (40 MG TOTAL) BY MOUTH DAILY. PT NEEDS OFFICE VISIT FOR FURTHER REFILLS 30 tablet 0   rosuvastatin  (CRESTOR ) 40 MG tablet TAKE 1 TABLET (40 MG TOTAL) BY MOUTH DAILY. PT NEEDS OFFICE VISIT FOR FURTHER REFILLS 30 tablet 0   semaglutide -weight management (WEGOVY ) 1.5 MG tablet Take 1 tablet (1.5 mg total) by mouth daily. Daily in AM on an empty stomach with 4 oz of water. Do not eat or drink for 30 minutes after dose. 30 tablet 0   fluticasone  (FLONASE ) 50 MCG/ACT nasal spray Place 2 sprays into both nostrils daily. 16 g 6   NONFORMULARY OR COMPOUNDED ITEM Wegovy  2.5mg  tabs Take 1 tablet by mouth daily. 30 each 0   oseltamivir  (TAMIFLU ) 75 MG capsule Take 1 capsule (75 mg total) by mouth 2 (two) times daily. 10 capsule 0   No facility-administered medications prior to visit.    No Known Allergies  Review of Systems  Constitutional:  Negative for fever and malaise/fatigue.  HENT:  Negative for congestion.   Eyes:  Negative for blurred vision.  Respiratory:  Negative for shortness of breath.   Cardiovascular:  Negative for chest pain, palpitations and leg swelling.  Gastrointestinal:  Negative for abdominal pain, blood in stool and nausea.  Genitourinary:  Negative for dysuria and frequency.  Musculoskeletal:  Negative for falls.  Skin:  Negative for rash.  Neurological:  Negative for dizziness, loss of  consciousness and headaches.  Endo/Heme/Allergies:  Negative for environmental allergies.  Psychiatric/Behavioral:  Negative for depression. The patient is not nervous/anxious.        Objective:    Physical Exam Vitals and nursing note reviewed.  Constitutional:      General: He is not in acute distress.    Appearance: Normal appearance. He is well-developed.  HENT:     Head: Normocephalic and atraumatic.  Eyes:     General:  No scleral icterus.       Right eye: No discharge.        Left eye: No discharge.  Cardiovascular:     Rate and Rhythm: Normal rate and regular rhythm.     Heart sounds: No murmur heard. Pulmonary:     Effort: Pulmonary effort is normal. No respiratory distress.     Breath sounds: Wheezing present.  Musculoskeletal:        General: Normal range of motion.     Cervical back: Normal range of motion and neck supple.     Right lower leg: No edema.     Left lower leg: No edema.  Skin:    General: Skin is warm and dry.  Neurological:     Mental Status: He is alert and oriented to person, place, and time.  Psychiatric:        Mood and Affect: Mood normal.        Behavior: Behavior normal.        Thought Content: Thought content normal.        Judgment: Judgment normal.     BP (!) 120/90 (BP Location: Right Arm, Patient Position: Sitting, Cuff Size: Large)   Pulse 72   Temp 98.2 F (36.8 C) (Oral)   Resp 18   Ht 5' 10 (1.778 m)   Wt (!) 322 lb 9.6 oz (146.3 kg)   SpO2 97%   BMI 46.29 kg/m  Wt Readings from Last 3 Encounters:  10/15/24 (!) 322 lb 9.6 oz (146.3 kg)  12/04/23 (!) 300 lb 9.6 oz (136.4 kg)  10/10/23 292 lb (132.5 kg)    Diabetic Foot Exam - Simple   No data filed    Lab Results  Component Value Date   WBC 8.7 07/10/2023   HGB 16.6 07/10/2023   HCT 48.8 07/10/2023   PLT 85.0 (L) 07/10/2023   GLUCOSE 103 (H) 07/10/2023   CHOL 109 07/10/2023   TRIG 127.0 07/10/2023   HDL 37.10 (L) 07/10/2023   LDLDIRECT 74.0 01/20/2022    LDLCALC 46 07/10/2023   ALT 27 07/10/2023   AST 24 07/10/2023   NA 142 07/10/2023   K 4.4 07/10/2023   CL 106 07/10/2023   CREATININE 1.30 07/10/2023   BUN 15 07/10/2023   CO2 28 07/10/2023   TSH 1.63 07/10/2023   HGBA1C 5.6 01/20/2023    Lab Results  Component Value Date   TSH 1.63 07/10/2023   Lab Results  Component Value Date   WBC 8.7 07/10/2023   HGB 16.6 07/10/2023   HCT 48.8 07/10/2023   MCV 85.4 07/10/2023   PLT 85.0 (L) 07/10/2023   Lab Results  Component Value Date   NA 142 07/10/2023   K 4.4 07/10/2023   CO2 28 07/10/2023   GLUCOSE 103 (H) 07/10/2023   BUN 15 07/10/2023   CREATININE 1.30 07/10/2023   BILITOT 0.8 07/10/2023   ALKPHOS 87 07/10/2023   AST 24 07/10/2023   ALT 27 07/10/2023   PROT 7.0 07/10/2023   ALBUMIN 4.6 07/10/2023   CALCIUM  10.1 07/10/2023   GFR 66.61 07/10/2023   Lab Results  Component Value Date   CHOL 109 07/10/2023   Lab Results  Component Value Date   HDL 37.10 (L) 07/10/2023   Lab Results  Component Value Date   LDLCALC 46 07/10/2023   Lab Results  Component Value Date   TRIG 127.0 07/10/2023   Lab Results  Component Value Date   CHOLHDL 3 07/10/2023   Lab  Results  Component Value Date   HGBA1C 5.6 01/20/2023       Assessment & Plan:  Morbid obesity with BMI of 45.0-49.9, adult (HCC) -     CBC with Differential/Platelet -     Comprehensive metabolic panel with GFR -     Lipid panel -     TSH -     Insulin , random -     Vitamin B12 -     VITAMIN D  25 Hydroxy (Vit-D Deficiency, Fractures) -     Hemoglobin A1c  Colon cancer screening -     Ambulatory referral to Gastroenterology  Severe obesity (BMI >= 40) (HCC) Assessment & Plan: Wegovy  rx sent in Discussed diet and exercise  F/u 3 months or sooner as needed    Assessment and Plan Assessment & Plan Morbid obesity   He has gained 22 pounds since March and 32 pounds since last January. Currently, he is not on Wegovy  due to insurance approval  requirements. He engages in gym activities three times a week, including cardio and weights. His dietary habits include skipping breakfast and lunch, which may cause metabolic issues. We discussed the importance of regular meals high in protein and fiber to stabilize metabolism. Wegovy  tablets, though less effective than the injectable form, are expected to help. Regular follow-up and dose adjustments based on weight loss progress are crucial. Submitted prior authorization for Wegovy  tablets. Encouraged regular meals with high protein and fiber content. Recommended protein shakes, almonds, boiled eggs, apples, and peanut butter as snacks. Advised using My Fitness Pal or [employer] app for dietary tracking. Encouraged increased water intake. Scheduled follow-up in three months or sooner if needed for dose adjustments.  Colon cancer screening   He is due for screening as age guidelines have changed to start at 45 years. Recommended scheduling a colonoscopy.    Tanara Turvey R Lowne Chase, DO "

## 2024-10-15 NOTE — Assessment & Plan Note (Signed)
 Wegovy  rx sent in Discussed diet and exercise  F/u 3 months or sooner as needed

## 2024-10-15 NOTE — Progress Notes (Signed)
 "      [address] [place] [address]             [phone]            [name] [id number] March 17, 1976   History of Present Illness:  Daniel Acevedo is a 49 year old man with medical history of hypertension, OSA, hyperlipidemia, obesity, tobacco user, and hypothyroidism who presents for continued surveillance of ascending thoracic aortic aneurysm.  On recent CTA of chest aneurysm measured 4.4 cm which is slightly larger than previous year which measured 4.3 cm. Echocardiogram in 2021 showed that the aortic valve is normal in structure and function.   He presents to the clinic today with his wife and he has been doing well.  His blood pressure is managed with current medication therapy.  He is attempting to lose weight and is going to the gym.  He has been using lighter weights and denies heavy lifting.  He is a current everyday smoker of cigarettes.  He smokes 2 packs per day.  He has tried cessation products in the past and is not ready to quit.  He denies chest pain, shortness of breath and lower leg edema.    Medications Ordered Prior to Encounter[1]   ROS: Review of Systems  Constitutional: Negative.  Negative for fever and malaise/fatigue.  Respiratory:  Negative for cough and shortness of breath.   Cardiovascular: Negative.  Negative for chest pain, palpitations and leg swelling.     BP 122/76 (BP Location: Left Arm, Patient Position: Sitting, Cuff Size: Large)   Pulse 61   Resp 20   Ht 5' 10 (1.778 m)   Wt (!) 324 lb 3.2 oz (147.1 kg)   SpO2 99% Comment: RA  BMI 46.52 kg/m   Physical Exam Constitutional:      Appearance: Normal appearance.  HENT:     Head: Normocephalic and atraumatic.  Cardiovascular:     Rate and Rhythm: Normal rate and regular rhythm.     Heart sounds: Normal heart sounds, S1 normal and S2 normal.  Pulmonary:     Effort: Pulmonary effort is normal.     Breath sounds: Normal breath sounds.  Skin:     General: Skin is warm and dry.  Neurological:     General: No focal deficit present.     Mental Status: He is alert and oriented to person, place, and time.      Imaging: CLINICAL DATA:  Aneurysm of ascending aorta without rupture.   EXAM: CT ANGIOGRAPHY CHEST WITH CONTRAST   TECHNIQUE: Multidetector CT imaging of the chest was performed using the standard protocol during bolus administration of intravenous contrast. Multiplanar CT image reconstructions and MIPs were obtained to evaluate the vascular anatomy.   RADIATION DOSE REDUCTION: This exam was performed according to the departmental dose-optimization program which includes automated exposure control, adjustment of the mA and/or kV according to patient size and/or use of iterative reconstruction technique.   CONTRAST:  75mL ISOVUE -370 IOPAMIDOL  (ISOVUE -370) INJECTION 76%   COMPARISON:  10/03/2023   FINDINGS: Cardiovascular: There is significant pulsation artifact involving the ascending thoracic aorta that limits accurate measurement of the thoracic aorta. Most accurate measurement is on image 116, sequence 5. Ascending thoracic aorta measures up to 4.4 cm and measured 4.3 cm in 2025. Again noted are calcifications at the aortic valve but limited evaluation of the aortic root due to the pulsation artifact. Morphology of the ascending thoracic aorta  is stable. No evidence to suggest an aortic dissection. Atherosclerotic calcifications near the origin of the brachiocephalic artery and left common carotid artery. Left vertebral artery originates from the arch. Arch great vessels are patent. Proximal aortic arch measures 3.7 cm and not significantly changed. Proximal descending thoracic aorta is stable measuring 2.5 cm. Celiac trunk and main branch vessels are patent. Proximal superior mesenteric artery is widely patent. A single right renal artery and 2 left renal arteries are identified on this chest examination. Heart size is  normal. Limited evaluation of the pulmonary arteries.   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 This structure measures up to 1.4 cm and stable since 03/16/2020.   Mediastinum/Nodes: Probable small hiatal hernia. No mediastinal, hilar or axillary lymph node enlargement.   Lungs/Pleura: Trachea and mainstem bronchi are patent. No pleural effusions. No airspace disease or lung consolidation. Stable punctate nodule in the left upper lobe on image 36, sequence 7. Stable calcified nodule along the right minor fissure on image 64, sequence 7.   Upper Abdomen: Images of the upper abdomen are unremarkable.   Musculoskeletal: No acute bone abnormality.   Review of the MIP images confirms the above findings.   IMPRESSION: 1. Fusiform aneurysm of the ascending thoracic aorta measuring up to 4.4 cm, previously measured 4.3 cm in 2025. Difficult to accurately measure the ascending thoracic aorta due to significant pulsation artifact. Consider follow-up CTA imaging with ECG gating. Recommend annual imaging followup by CTA or MRA. This recommendation follows 2010ACCF/AHA/AATS/ACR/ASA/SCA/SCAI/SIR/STS/SVM Guidelines for the Diagnosis and Management of Patients with Thoracic Aortic Disease. Circulation. 2010; 121: [id number]. Aortic aneurysm NOS (ICD10-I71.9) 2. Stable intravascular calcified structure near the inferior cavoatrial junction. No significant change since 2021. 3. Aortic Atherosclerosis (ICD10-I70.0). 4. No acute chest abnormality.     Electronically Signed   By: Juliene Balder M.D.   On: 10/07/2024 11:42     A/P: Aneurysm of ascending aorta without rupture -4.4 cm ascending thoracic aortic aneurysm on CTA of chest. Echocardiogram in 2021 showed that the aortic valve is normal in structure and function. -We discussed the natural history and and risk factors for growth of ascending aortic aneurysms. Discussed recommendations to  minimize the risk of further expansion or dissection including careful blood pressure control, avoidance of contact sports and heavy lifting, attention to lipid management.  We covered the importance of smoking cessation.  The patient does not yet meet surgical criteria of >5.5cm. The patient is aware of signs and symptoms of aortic dissection and when to present to the emergency department   -Follow up in one year with CTA of chest for continued surveillance    Risk Modification:  Statin:  rosuvastatin    Smoking cessation instruction/counseling given:  counseled patient on the dangers of tobacco use, advised patient to stop smoking, and reviewed strategies to maximize success  Patient was counseled on importance of Blood Pressure Control  They are instructed to contact their Primary Care Physician if they start to have blood pressure readings over 130s/90s. Do not ever stop blood pressure medications on your own, unless instructed by healthcare professional.  Please avoid use of Fluoroquinolones as this can potentially increase your risk of Aortic Rupture and/or Dissection  Patient educated on signs and symptoms of Aortic Dissection, handout also provided in AVS  Manuelita CHRISTELLA Rough, PA-C 10/15/24     [1]  Current Outpatient Medications on File Prior to Visit  Medication Sig Dispense Refill   amLODipine  (NORVASC )  5 MG tablet TAKE 1 TABLET (5 MG TOTAL) BY MOUTH DAILY. PT NEEDS OFFICE VISIT FOR FURTHER REFILLS 30 tablet 0   escitalopram  (LEXAPRO ) 20 MG tablet Take 1 tablet (20 mg total) by mouth daily. 90 tablet 3   ezetimibe  (ZETIA ) 10 MG tablet TAKE 1 TABLET (10 MG TOTAL) BY MOUTH DAILY. PT NEEDS OFFICE VISIT FOR FURTHER REFILLS 30 tablet 0   fenofibrate  160 MG tablet TAKE 1 TABLET (160 MG TOTAL) BY MOUTH DAILY. PT NEEDS OFFICE VISIT FOR FURTHER REFILLS 30 tablet 0   levothyroxine  (SYNTHROID ) 200 MCG tablet Take 1 tablet (200 mcg total) by mouth daily before breakfast. Pt needs office  visit for further refills 30 tablet 0   lisinopril  (ZESTRIL ) 40 MG tablet TAKE 1 TABLET BY MOUTH EVERY DAY 30 tablet 5   pantoprazole  (PROTONIX ) 40 MG tablet TAKE 1 TABLET (40 MG TOTAL) BY MOUTH DAILY. PT NEEDS OFFICE VISIT FOR FURTHER REFILLS 30 tablet 0   rosuvastatin  (CRESTOR ) 40 MG tablet TAKE 1 TABLET (40 MG TOTAL) BY MOUTH DAILY. PT NEEDS OFFICE VISIT FOR FURTHER REFILLS 30 tablet 0   semaglutide -weight management (WEGOVY ) 1.5 MG tablet Take 1 tablet (1.5 mg total) by mouth daily. Daily in AM on an empty stomach with 4 oz of water. Do not eat or drink for 30 minutes after dose. 30 tablet 0   No current facility-administered medications on file prior to visit.   "

## 2024-10-16 ENCOUNTER — Ambulatory Visit: Payer: Self-pay | Admitting: Family Medicine

## 2024-10-16 DIAGNOSIS — D696 Thrombocytopenia, unspecified: Secondary | ICD-10-CM

## 2024-10-16 DIAGNOSIS — E039 Hypothyroidism, unspecified: Secondary | ICD-10-CM

## 2024-10-16 LAB — INSULIN, RANDOM: Insulin: 37.2 u[IU]/mL — ABNORMAL HIGH

## 2024-10-23 ENCOUNTER — Other Ambulatory Visit: Payer: Self-pay | Admitting: Family Medicine

## 2024-10-28 ENCOUNTER — Other Ambulatory Visit (HOSPITAL_COMMUNITY): Payer: Self-pay

## 2024-10-31 ENCOUNTER — Other Ambulatory Visit (HOSPITAL_COMMUNITY): Payer: Self-pay

## 2024-11-08 ENCOUNTER — Ambulatory Visit: Admitting: Internal Medicine
